# Patient Record
Sex: Female | Born: 1960 | Race: Black or African American | Hispanic: No | Marital: Married | Smoking: Never smoker
Health system: Southern US, Community
[De-identification: ages and names within clinical notes are randomized; demographics above are authoritative.]

## PROBLEM LIST (undated history)

## (undated) DIAGNOSIS — R51 Headache: Secondary | ICD-10-CM

## (undated) DIAGNOSIS — Z9109 Other allergy status, other than to drugs and biological substances: Secondary | ICD-10-CM

## (undated) DIAGNOSIS — M1990 Unspecified osteoarthritis, unspecified site: Secondary | ICD-10-CM

## (undated) DIAGNOSIS — E7800 Pure hypercholesterolemia, unspecified: Secondary | ICD-10-CM

## (undated) DIAGNOSIS — F419 Anxiety disorder, unspecified: Secondary | ICD-10-CM

## (undated) DIAGNOSIS — M549 Dorsalgia, unspecified: Secondary | ICD-10-CM

## (undated) DIAGNOSIS — R079 Chest pain, unspecified: Principal | ICD-10-CM

## (undated) DIAGNOSIS — I10 Essential (primary) hypertension: Secondary | ICD-10-CM

## (undated) DIAGNOSIS — K219 Gastro-esophageal reflux disease without esophagitis: Secondary | ICD-10-CM

## (undated) HISTORY — DX: Essential (primary) hypertension: I10

## (undated) HISTORY — PX: CARDIAC CATHETERIZATION: SHX172

## (undated) HISTORY — DX: Chest pain, unspecified: R07.9

## (undated) HISTORY — PX: UNILATERAL SALPINGECTOMY: SHX6160

## (undated) HISTORY — PX: DILATION AND CURETTAGE OF UTERUS: SHX78

---

## 2001-07-02 ENCOUNTER — Emergency Department (HOSPITAL_COMMUNITY): Admission: EM | Admit: 2001-07-02 | Discharge: 2001-07-02 | Payer: Self-pay | Admitting: Emergency Medicine

## 2001-11-30 ENCOUNTER — Emergency Department (HOSPITAL_COMMUNITY): Admission: EM | Admit: 2001-11-30 | Discharge: 2001-11-30 | Payer: Self-pay | Admitting: *Deleted

## 2004-10-06 ENCOUNTER — Emergency Department (HOSPITAL_COMMUNITY): Admission: EM | Admit: 2004-10-06 | Discharge: 2004-10-06 | Payer: Self-pay | Admitting: Emergency Medicine

## 2005-04-29 ENCOUNTER — Emergency Department (HOSPITAL_COMMUNITY): Admission: EM | Admit: 2005-04-29 | Discharge: 2005-04-29 | Payer: Self-pay | Admitting: Emergency Medicine

## 2005-12-23 ENCOUNTER — Emergency Department (HOSPITAL_COMMUNITY): Admission: EM | Admit: 2005-12-23 | Discharge: 2005-12-24 | Payer: Self-pay | Admitting: Emergency Medicine

## 2006-03-13 ENCOUNTER — Emergency Department (HOSPITAL_COMMUNITY): Admission: EM | Admit: 2006-03-13 | Discharge: 2006-03-13 | Payer: Self-pay | Admitting: Emergency Medicine

## 2006-09-10 ENCOUNTER — Emergency Department (HOSPITAL_COMMUNITY): Admission: EM | Admit: 2006-09-10 | Discharge: 2006-09-10 | Payer: Self-pay | Admitting: Emergency Medicine

## 2007-06-03 ENCOUNTER — Emergency Department (HOSPITAL_COMMUNITY): Admission: EM | Admit: 2007-06-03 | Discharge: 2007-06-03 | Payer: Self-pay | Admitting: Emergency Medicine

## 2007-12-24 ENCOUNTER — Emergency Department (HOSPITAL_COMMUNITY): Admission: EM | Admit: 2007-12-24 | Discharge: 2007-12-24 | Payer: Self-pay | Admitting: Emergency Medicine

## 2008-10-06 ENCOUNTER — Emergency Department (HOSPITAL_COMMUNITY): Admission: EM | Admit: 2008-10-06 | Discharge: 2008-10-06 | Payer: Self-pay | Admitting: Emergency Medicine

## 2010-06-14 ENCOUNTER — Emergency Department (HOSPITAL_COMMUNITY): Admission: EM | Admit: 2010-06-14 | Discharge: 2010-06-14 | Payer: Self-pay | Admitting: Emergency Medicine

## 2010-07-07 ENCOUNTER — Emergency Department (HOSPITAL_COMMUNITY): Admission: EM | Admit: 2010-07-07 | Discharge: 2010-07-07 | Payer: Self-pay | Admitting: Emergency Medicine

## 2010-08-27 ENCOUNTER — Emergency Department (HOSPITAL_COMMUNITY)
Admission: EM | Admit: 2010-08-27 | Discharge: 2010-08-27 | Payer: Self-pay | Source: Home / Self Care | Admitting: Emergency Medicine

## 2011-06-04 LAB — STREP A DNA PROBE: Group A Strep Probe: POSITIVE

## 2011-06-04 LAB — RAPID STREP SCREEN (MED CTR MEBANE ONLY): Streptococcus, Group A Screen (Direct): NEGATIVE

## 2012-01-18 ENCOUNTER — Encounter (HOSPITAL_COMMUNITY): Payer: Self-pay | Admitting: Emergency Medicine

## 2012-01-18 ENCOUNTER — Emergency Department (HOSPITAL_COMMUNITY)
Admission: EM | Admit: 2012-01-18 | Discharge: 2012-01-18 | Disposition: A | Discharge status: Home / Self Care | Payer: Self-pay | Attending: Emergency Medicine | Admitting: Emergency Medicine

## 2012-01-18 ENCOUNTER — Emergency Department (HOSPITAL_COMMUNITY): Payer: Self-pay

## 2012-01-18 DIAGNOSIS — N390 Urinary tract infection, site not specified: Secondary | ICD-10-CM | POA: Insufficient documentation

## 2012-01-18 DIAGNOSIS — D259 Leiomyoma of uterus, unspecified: Secondary | ICD-10-CM | POA: Insufficient documentation

## 2012-01-18 DIAGNOSIS — K8020 Calculus of gallbladder without cholecystitis without obstruction: Secondary | ICD-10-CM | POA: Insufficient documentation

## 2012-01-18 DIAGNOSIS — R109 Unspecified abdominal pain: Secondary | ICD-10-CM | POA: Insufficient documentation

## 2012-01-18 DIAGNOSIS — A599 Trichomoniasis, unspecified: Secondary | ICD-10-CM | POA: Insufficient documentation

## 2012-01-18 HISTORY — DX: Other allergy status, other than to drugs and biological substances: Z91.09

## 2012-01-18 LAB — URINALYSIS, ROUTINE W REFLEX MICROSCOPIC

## 2012-01-18 LAB — URINE MICROSCOPIC-ADD ON

## 2012-01-18 MED ORDER — IBUPROFEN 800 MG PO TABS: 800.0000 mg | ORAL_TABLET | Freq: Three times a day (TID) | ORAL | Status: AC

## 2012-01-18 MED ORDER — HYDROCODONE-ACETAMINOPHEN 5-325 MG PO TABS: 2.0000 | ORAL_TABLET | ORAL | Status: AC | PRN

## 2012-01-18 MED ORDER — CEPHALEXIN 500 MG PO CAPS: 500.0000 mg | ORAL_CAPSULE | Freq: Four times a day (QID) | ORAL | Status: AC

## 2012-01-18 MED ORDER — METRONIDAZOLE 500 MG PO TABS
2000.0000 mg | ORAL_TABLET | Freq: Once | ORAL | Status: AC
  Administered 2012-01-18: 2000 mg via ORAL
  Filled 2012-01-18: qty 4

## 2012-01-18 MED ORDER — IBUPROFEN 800 MG PO TABS
800.0000 mg | ORAL_TABLET | Freq: Once | ORAL | Status: AC
  Administered 2012-01-18: 800 mg via ORAL
  Filled 2012-01-18: qty 1

## 2012-01-18 MED ORDER — HYDROCODONE-ACETAMINOPHEN 5-325 MG PO TABS
2.0000 | ORAL_TABLET | Freq: Once | ORAL | Status: AC
  Administered 2012-01-18: 2 via ORAL
  Filled 2012-01-18: qty 2

## 2012-01-18 NOTE — ED Notes (Signed)
Family at bedside. Patient is comfortable at this time. 

## 2012-01-18 NOTE — Discharge Instructions (Signed)
Trichomoniasis Trichomoniasis is an infection, caused by the Trichomonas organism, that affects both women and men. In women, the outer female genitalia and the vagina are affected. In men, the penis is mainly affected, but the prostate and other reproductive organs can also be involved. Trichomoniasis is a sexually transmitted disease (STD) and is most often passed to another person through sexual contact. The majority of people who get trichomoniasis do so from a sexual encounter and are also at risk for other STDs. CAUSES   Sexual intercourse with an infected partner.   It can be present in swimming pools or hot tubs.  SYMPTOMS   Abnormal gray-green frothy vaginal discharge in women.   Vaginal itching and irritation in women.   Itching and irritation of the area outside the vagina in women.   Penile discharge with or without pain in males.   Inflammation of the urethra (urethritis), causing painful urination.   Bleeding after sexual intercourse.  RELATED COMPLICATIONS  Pelvic inflammatory disease.   Infection of the uterus (endometritis).   Infertility.   Tubal (ectopic) pregnancy.   It can be associated with other STDs, including gonorrhea and chlamydia, hepatitis B, and HIV.  COMPLICATIONS DURING PREGNANCY  Early (premature) delivery.   Premature rupture of the membranes (PROM).   Low birth weight.  DIAGNOSIS   Visualization of Trichomonas under the microscope from the vagina discharge.   Ph of the vagina greater than 4.5, tested with a test tape.   Trich Rapid Test.   Culture of the organism, but this is not usually needed.   It may be found on a Pap test.   Having a "strawberry cervix,"which means the cervix looks very red like a strawberry.  TREATMENT   You may be given medication to fight the infection. Inform your caregiver if you could be or are pregnant. Some medications used to treat the infection should not be taken during pregnancy.    Over-the-counter medications or creams to decrease itching or irritation may be recommended.   Your sexual partner will need to be treated if infected.  HOME CARE INSTRUCTIONS   Take all medication prescribed by your caregiver.   Take over-the-counter medication for itching or irritation as directed by your caregiver.   Do not have sexual intercourse while you have the infection.   Do not douche or wear tampons.   Discuss your infection with your partner, as your partner may have acquired the infection from you. Or, your partner may have been the person who transmitted the infection to you.   Have your sex partner examined and treated if necessary.   Practice safe, informed, and protected sex.   See your caregiver for other STD testing.  SEEK MEDICAL CARE IF:   You still have symptoms after you finish the medication.   You have an oral temperature above 102 F (38.9 C).   You develop belly (abdominal) pain.   You have pain when you urinate.   You have bleeding after sexual intercourse.   You develop a rash.   The medication makes you sick or makes you throw up (vomit).  Document Released: 02/19/2001 Document Revised: 08/15/2011 Document Reviewed: 03/17/2009 [facility] Patient Information 2012 [place], [place].Urinary Tract Infection Infections of the urinary tract can start in several places. A bladder infection (cystitis), a kidney infection (pyelonephritis), and a prostate infection (prostatitis) are different types of urinary tract infections (UTIs). They usually get better if treated with medicines (antibiotics) that kill germs. Take all the medicine until it  is gone. You or your child may feel better in a few days, but TAKE ALL MEDICINE or the infection may not respond and may become more difficult to treat. HOME CARE INSTRUCTIONS   Drink enough water and fluids to keep the urine clear or pale yellow. Cranberry juice is especially recommended, in addition to large  amounts of water.   Avoid caffeine, tea, and carbonated beverages. They tend to irritate the bladder.   Alcohol may irritate the prostate.   Only take over-the-counter or prescription medicines for pain, discomfort, or fever as directed by your caregiver.  To prevent further infections:  Empty the bladder often. Avoid holding urine for long periods of time.   After a bowel movement, women should cleanse from front to back. Use each tissue only once.   Empty the bladder before and after sexual intercourse.  FINDING OUT THE RESULTS OF YOUR TEST Not all test results are available during your visit. If your or your child's test results are not back during the visit, make an appointment with your caregiver to find out the results. Do not assume everything is normal if you have not heard from your caregiver or the medical facility. It is important for you to follow up on all test results. SEEK MEDICAL CARE IF:   There is back pain.   Your baby is older than 3 months with a rectal temperature of 100.5 F (38.1 C) or higher for more than 1 day.   Your or your child's problems (symptoms) are no better in 3 days. Return sooner if you or your child is getting worse.  SEEK IMMEDIATE MEDICAL CARE IF:   There is severe back pain or lower abdominal pain.   You or your child develops chills.   You have a fever.   Your baby is older than 3 months with a rectal temperature of 102 F (38.9 C) or higher.   Your baby is 34 months old or younger with a rectal temperature of 100.4 F (38 C) or higher.   There is nausea or vomiting.   There is continued burning or discomfort with urination.  MAKE SURE YOU:   Understand these instructions.   Will watch your condition.   Will get help right away if you are not doing well or get worse.  Document Released: 06/05/2005 Document Revised: 08/15/2011 Document Reviewed: 01/08/2007 [facility] Patient Information 2012 [place], [place].

## 2012-01-18 NOTE — ED Notes (Signed)
Family at bedside. Patient states she does not need anything at this time. 

## 2012-01-18 NOTE — ED Notes (Signed)
Pt c/o pain to L lower back and goes half way around. Does not go into front abd area. Denies gi/gu sx's. lnbm this am and denies black or bloody stools. Pain worse with crossing legs and walking.

## 2012-01-18 NOTE — ED Provider Notes (Signed)
History   This chart was scribed for Glynn Octave, MD by Sofie Rower. The patient was seen in room APA07/APA07 and the patient's care was started at 9:09 AM     CSN: [id number]  Arrival date & time 01/18/12  0856   None     Chief Complaint  Patient presents with  . Flank Pain    (Consider location/radiation/quality/duration/timing/severity/associated sxs/prior treatment) HPI  Kendra Valenzuela is a 51 y.o. female who presents to the Emergency Department complaining of moderate, constant flank pain located at the left lower back onset one week ago with associated symptoms of radiating leg pain. The pt states the flank pain "comes in the early morning." The pt informs the EDP "that there was no injry associated with the flank pain." Modifying factors include walking and certain positions which intensify the pain, lying down which moderately relieves the pain, taking ibuprofen which moderately relieves the pain. Pt has a hx of partial hysterectomy.  Pt denies abdominal pain, fever, vomiting, dysuria, hematuria, losing bladder or bowel control.        History  Substance Use Topics  . Smoking status: Former Games developer  . Smokeless tobacco: Not on file  . Alcohol Use: No    OB History    Grav Para Term Preterm Abortions TAB SAB Ect Mult Living                  Review of Systems  All other systems reviewed and are negative.    10 Systems reviewed and all are negative for acute change except as noted in the HPI.    Allergies  Review of patient's allergies indicates no known allergies.  Home Medications  No current outpatient prescriptions on file.  BP 135/88  Pulse 91  Temp 98.7 F (37.1 C)  Resp 18  Ht 5\' 6"  (1.676 m)  Wt 176 lb (79.833 kg)  BMI 28.41 kg/m2  Physical Exam  Nursing note and vitals reviewed. Constitutional: She is oriented to person, place, and time. She appears well-developed and well-nourished.  HENT:  Right Ear: External ear normal.    Left Ear: External ear normal.  Nose: Nose normal.  Eyes: Conjunctivae and EOM are normal. Right eye exhibits no discharge. Left eye exhibits no discharge.  Neck: Normal range of motion. Neck supple.  Cardiovascular: Normal rate.   Pulmonary/Chest: Effort normal.  Abdominal: Soft. There is no tenderness.  Musculoskeletal: Normal range of motion. She exhibits tenderness (Mild left CVA.).       Mild left CVA tenderness. 5/5 strength in lower extremities. Intact ankle plantarflexion ,dorsiflexion, great toe extension intact bilaterally, +2 DP pulses, unable to illicit patellar reflexes.   Neurological: She is alert and oriented to person, place, and time.  Skin: Skin is warm and dry.  Psychiatric: She has a normal mood and affect. Her behavior is normal.    ED Course  Procedures (including critical care time)  9:13AM- EDP at bedside discusses treatment plan.   10:05AM- Recheck. EDP at bedside discusses laboratory results. Pt denies any abdominal pain or discharge.   12:07PM- Recheck. EDP at bedside discusses continued treatment plan.     COORDINATION OF CARE:     Labs Reviewed  URINALYSIS, ROUTINE W REFLEX MICROSCOPIC - Abnormal; Notable for the following:    APPearance HAZY (*)    Leukocytes, UA MODERATE (*)    All other components within normal limits  URINE MICROSCOPIC-ADD ON - Abnormal; Notable for the following:    Squamous Epithelial / LPF  FEW (*)    Bacteria, UA FEW (*)    All other components within normal limits   Ct Abdomen Pelvis Wo Contrast  01/18/2012  *RADIOLOGY REPORT*  Clinical Data: Flank pain  CT ABDOMEN AND PELVIS WITHOUT CONTRAST  Technique:  Multidetector CT imaging of the abdomen and pelvis was performed following the standard protocol without intravenous contrast.  Comparison: None.  Findings: Lung bases are clear.  No pericardial or pleural effusion.  There is no focal liver abnormality.  Multiple stones are noted along the dependent portion of the  gallbladder.  No evidence for gallbladder wall thickening or pericholecystic inflammatory change. No biliary dilatation.  The pancreas appears normal.  The spleen appears within normal limits.  Both adrenal glands are normal.  Duplicated right renal collecting system.  No right hydronephrosis.  No ureteral row calculi identified.  The left kidney appears within normal limits.  No left- sided hydronephrosis or hydroureter.  The urinary bladder appears normal.  The patient has a partially calcified fibroid uterus.  There is no enlarged upper abdominal lymph nodes.  No pelvic or inguinal adenopathy.  The stomach appears normal.  The small bowel loops are within normal limits.  The appendix is identified and appears normal.  The colon is unremarkable.  There are degenerative changes noted at the L5-S1 facet joints.  IMPRESSION:  1.  No evidence for renal calculi or obstructive uropathy. 2.  Enlarged and partially calcified fibroid uterus. 3.  Gallstones.  No secondary signs of acute cholecystitis.  Original Report Authenticated By: Rosealee Albee, M.D.      No diagnosis found.    MDM  Left flank pain without radiation. No urinary or vaginal symptoms. No weakness, numbness, tingling in the legs. No bowel and bladder incontinence, fever or vomiting.   Urinalysis with possible infection. Patient informed of trichomonas finding we'll treat with Flagyl.  Denies vaginal bleeding or discharge.  Declines pelvic exam. No suggestion of kidney stone. No suggestion of serious spinal cord pathology. No red flags for back pain.  CT negative for stone.  Will treat possible UTI. Culture sent.  Return precautions discussed.  I personally performed the services described in this documentation, which was scribed in my presence.  The recorded information has been reviewed and considered.   Glynn Octave, MD 01/18/12 [id number]

## 2012-07-19 ENCOUNTER — Emergency Department (HOSPITAL_COMMUNITY): Payer: Self-pay

## 2012-07-19 ENCOUNTER — Encounter (HOSPITAL_COMMUNITY): Payer: Self-pay | Admitting: Emergency Medicine

## 2012-07-19 ENCOUNTER — Emergency Department (HOSPITAL_COMMUNITY)
Admission: EM | Admit: 2012-07-19 | Discharge: 2012-07-19 | Disposition: A | Discharge status: Home / Self Care | Payer: Self-pay | Attending: Emergency Medicine | Admitting: Emergency Medicine

## 2012-07-19 DIAGNOSIS — Z9109 Other allergy status, other than to drugs and biological substances: Secondary | ICD-10-CM | POA: Insufficient documentation

## 2012-07-19 DIAGNOSIS — Z87891 Personal history of nicotine dependence: Secondary | ICD-10-CM | POA: Insufficient documentation

## 2012-07-19 DIAGNOSIS — M5431 Sciatica, right side: Secondary | ICD-10-CM

## 2012-07-19 DIAGNOSIS — M5430 Sciatica, unspecified side: Secondary | ICD-10-CM | POA: Insufficient documentation

## 2012-07-19 MED ORDER — CYCLOBENZAPRINE HCL 5 MG PO TABS: 5.0000 mg | ORAL_TABLET | Freq: Three times a day (TID) | ORAL | Status: DC | PRN

## 2012-07-19 MED ORDER — HYDROCODONE-ACETAMINOPHEN 5-325 MG PO TABS: 1.0000 | ORAL_TABLET | ORAL | Status: AC | PRN

## 2012-07-19 MED ORDER — IBUPROFEN 600 MG PO TABS: 600.0000 mg | ORAL_TABLET | Freq: Three times a day (TID) | ORAL | Status: DC

## 2012-07-19 MED ORDER — HYDROCODONE-ACETAMINOPHEN 5-325 MG PO TABS
1.0000 | ORAL_TABLET | Freq: Once | ORAL | Status: AC
  Administered 2012-07-19: 1 via ORAL
  Filled 2012-07-19: qty 1

## 2012-07-19 NOTE — ED Notes (Signed)
Pt reports onset of back pain Yesterday and R knee pain this am.

## 2012-07-21 NOTE — ED Provider Notes (Signed)
History     CSN: [id number]  Arrival date & time 07/19/12  1039   First MD Initiated Contact with Patient 07/19/12 1120      Chief Complaint  Patient presents with  . Back Pain    (Consider location/radiation/quality/duration/timing/severity/associated sxs/prior treatment) HPI Comments: Kendra Valenzuela presents with right lower back pain which started gradually yesterday and woke today with radiation of pain into her right lateral knee.  Pain is constant in her lower back,  But worse with movement and with attempts to stand straight.  She denies any obvious injury to her back, does not recall any specific lifting incidents or falls.  She denies numbness or weakness in her legs and has had no urinary or bowel retention or incontinence.  She has taken ibuprofen with mild relief.  She does not have a history of cancer and denies IVDU.  The history is provided by the patient.    Past Medical History  Diagnosis Date  . Environmental allergies     Past Surgical History  Procedure Date  . Partial hysterectomy     Family History  Problem Relation Age of Onset  . Family history unknown: Yes    History  Substance Use Topics  . Smoking status: Former Games developer  . Smokeless tobacco: Never Used  . Alcohol Use: No    OB History    Grav Para Term Preterm Abortions TAB SAB Ect Mult Living                  Review of Systems  Constitutional: Negative for fever.  Respiratory: Negative for shortness of breath.   Cardiovascular: Negative for chest pain and leg swelling.  Gastrointestinal: Negative for abdominal pain, constipation and abdominal distention.  Genitourinary: Negative for dysuria, urgency, frequency, flank pain and difficulty urinating.  Musculoskeletal: Positive for back pain. Negative for joint swelling and gait problem.  Skin: Negative for rash.  Neurological: Negative for weakness and numbness.    Allergies  Review of patient's allergies indicates no known  allergies.  Home Medications   Current Outpatient Rx  Name  Route  Sig  Dispense  Refill  . CYCLOBENZAPRINE HCL 5 MG PO TABS   Oral   Take 1 tablet (5 mg total) by mouth 3 (three) times daily as needed for muscle spasms.   15 tablet   0   . DIPHENHYDRAMINE HCL 25 MG PO TABS   Oral   Take 25 mg by mouth every 6 (six) hours as needed. For allergy         . HYDROCODONE-ACETAMINOPHEN 5-325 MG PO TABS   Oral   Take 1 tablet by mouth every 4 (four) hours as needed for pain.   20 tablet   0   . IBUPROFEN 200 MG PO TABS   Oral   Take 400 mg by mouth every 6 (six) hours as needed. For pain         . IBUPROFEN 600 MG PO TABS   Oral   Take 1 tablet (600 mg total) by mouth 3 (three) times daily.   15 tablet   0     BP 152/87  Pulse 98  Temp 98.5 F (36.9 C) (Oral)  Resp 14  Ht 5\' 6"  (1.676 m)  Wt 160 lb (72.576 kg)  BMI 25.82 kg/m2  SpO2 98%  Physical Exam  Nursing note and vitals reviewed. Constitutional: She appears well-developed and well-nourished.  HENT:  Head: Normocephalic.  Eyes: Conjunctivae normal are normal.  Neck:  Normal range of motion. Neck supple.  Cardiovascular: Normal rate and intact distal pulses.        Pedal pulses normal.  Pulmonary/Chest: Effort normal.  Abdominal: Soft. Bowel sounds are normal. She exhibits no distension and no mass.  Musculoskeletal: Normal range of motion. She exhibits no edema.       Lumbar back: She exhibits tenderness. She exhibits no swelling, no edema and no spasm.       Patient has right paralumbar ttp.  No midline tenderness.  Neurological: She is alert. She has normal strength. She displays no atrophy and no tremor. No sensory deficit. Gait normal.  Reflex Scores:      Patellar reflexes are 2+ on the right side and 2+ on the left side.      Achilles reflexes are 2+ on the right side and 2+ on the left side.      No strength deficit noted in hip and knee flexor and extensor muscle groups.  Ankle flexion and  extension intact.  Skin: Skin is warm and dry.  Psychiatric: She has a normal mood and affect.    ED Course  Procedures (including critical care time)  Labs Reviewed - No data to display No results found.   1. Sciatica of right side       MDM  Pt prescribed hydrocodone, flexeril and encouraged to continue with ibuprofen,  Prescribing prescription 600 mg tab strength.  Heating pad,  Avoid lifting.  Recheck if not improved in next 5 days,  referrals given.  No neuro deficit on exam or by history to suggest emergent or surgical presentation.  Also discussed worsened sx that should prompt immediate re-evaluation including distal weakness, bowel/bladder retention/incontinence.              Burgess Amor, [place] 07/21/12 1859

## 2012-07-22 NOTE — ED Provider Notes (Signed)
Medical screening examination/treatment/procedure(s) were performed by non-physician practitioner and as supervising physician I was immediately available for consultation/collaboration.   Charene Mccallister M Eleanora Guinyard, DO 07/22/12 1358 

## 2013-01-07 DIAGNOSIS — R079 Chest pain, unspecified: Secondary | ICD-10-CM | POA: Insufficient documentation

## 2013-01-07 DIAGNOSIS — R0789 Other chest pain: Secondary | ICD-10-CM | POA: Insufficient documentation

## 2013-01-07 HISTORY — DX: Chest pain, unspecified: R07.9

## 2013-01-13 ENCOUNTER — Other Ambulatory Visit: Payer: Self-pay

## 2013-01-13 ENCOUNTER — Encounter (HOSPITAL_COMMUNITY): Payer: Self-pay

## 2013-01-13 ENCOUNTER — Emergency Department (HOSPITAL_COMMUNITY): Payer: Self-pay

## 2013-01-13 ENCOUNTER — Observation Stay (HOSPITAL_COMMUNITY)
Admission: EM | Admit: 2013-01-13 | Discharge: 2013-01-14 | Disposition: A | Discharge status: Home / Self Care | Payer: MEDICAID | Attending: Internal Medicine | Admitting: Internal Medicine

## 2013-01-13 DIAGNOSIS — I10 Essential (primary) hypertension: Secondary | ICD-10-CM

## 2013-01-13 DIAGNOSIS — R079 Chest pain, unspecified: Principal | ICD-10-CM

## 2013-01-13 DIAGNOSIS — M549 Dorsalgia, unspecified: Secondary | ICD-10-CM

## 2013-01-13 DIAGNOSIS — G8929 Other chronic pain: Secondary | ICD-10-CM | POA: Diagnosis present

## 2013-01-13 DIAGNOSIS — I517 Cardiomegaly: Secondary | ICD-10-CM

## 2013-01-13 HISTORY — DX: Dorsalgia, unspecified: M54.9

## 2013-01-13 HISTORY — DX: Essential (primary) hypertension: I10

## 2013-01-13 LAB — URINALYSIS, ROUTINE W REFLEX MICROSCOPIC
Bilirubin Urine: NEGATIVE
Ketones, ur: NEGATIVE mg/dL
Leukocytes, UA: NEGATIVE
Nitrite: NEGATIVE
Protein, ur: NEGATIVE mg/dL
Urobilinogen, UA: 0.2 mg/dL (ref 0.0–1.0)

## 2013-01-13 LAB — CBC WITH DIFFERENTIAL/PLATELET
Basophils Absolute: 0 10*3/uL (ref 0.0–0.1)
Basophils Relative: 0 % (ref 0–1)
Eosinophils Relative: 3 % (ref 0–5)
Hemoglobin: 12.3 g/dL (ref 12.0–15.0)
Lymphs Abs: 3.2 10*3/uL (ref 0.7–4.0)
Monocytes Absolute: 0.5 10*3/uL (ref 0.1–1.0)
RBC: 4.41 MIL/uL (ref 3.87–5.11)
RDW: 14.6 % (ref 11.5–15.5)

## 2013-01-13 LAB — LIPID PANEL
HDL: 65 mg/dL (ref >39)
LDL Cholesterol: 104 mg/dL — ABNORMAL HIGH (ref 0–99)
Total CHOL/HDL Ratio: 3.3 RATIO

## 2013-01-13 LAB — TROPONIN I
Troponin I: <0.3 ng/mL (ref <0.30)
Troponin I: <0.3 ng/mL (ref <0.30)

## 2013-01-13 LAB — RAPID URINE DRUG SCREEN, HOSP PERFORMED
Cocaine: NOT DETECTED
Opiates: POSITIVE — AB

## 2013-01-13 LAB — BASIC METABOLIC PANEL
BUN: 20 mg/dL (ref 6–23)
CO2: 25 mEq/L (ref 19–32)
Calcium: 9.3 mg/dL (ref 8.4–10.5)
Creatinine, Ser: 0.64 mg/dL (ref 0.50–1.10)
Glucose, Bld: 110 mg/dL — ABNORMAL HIGH (ref 70–99)

## 2013-01-13 LAB — PRO B NATRIURETIC PEPTIDE: Pro B Natriuretic peptide (BNP): 44.6 pg/mL (ref 0–125)

## 2013-01-13 LAB — HEMOGLOBIN A1C: Mean Plasma Glucose: 117 mg/dL — ABNORMAL HIGH (ref <117)

## 2013-01-13 LAB — D-DIMER, QUANTITATIVE: D-Dimer, Quant: <0.27 ug/mL-FEU (ref 0.00–0.48)

## 2013-01-13 MED ORDER — NITROGLYCERIN 0.4 MG SL SUBL
0.4000 mg | SUBLINGUAL_TABLET | SUBLINGUAL | Status: DC | PRN
  Administered 2013-01-13: 0.4 mg via SUBLINGUAL
  Filled 2013-01-13: qty 25

## 2013-01-13 MED ORDER — ACETAMINOPHEN 650 MG RE SUPP: 650.0000 mg | Freq: Four times a day (QID) | RECTAL | Status: DC | PRN

## 2013-01-13 MED ORDER — ONDANSETRON HCL 4 MG PO TABS: 4.0000 mg | ORAL_TABLET | Freq: Four times a day (QID) | ORAL | Status: DC | PRN

## 2013-01-13 MED ORDER — MORPHINE SULFATE 2 MG/ML IJ SOLN: 1.0000 mg | INTRAMUSCULAR | Status: DC | PRN

## 2013-01-13 MED ORDER — ONDANSETRON HCL 4 MG/2ML IJ SOLN: 4.0000 mg | Freq: Four times a day (QID) | INTRAMUSCULAR | Status: DC | PRN

## 2013-01-13 MED ORDER — MORPHINE SULFATE 2 MG/ML IJ SOLN
2.0000 mg | Freq: Once | INTRAMUSCULAR | Status: AC
  Administered 2013-01-13: 2 mg via INTRAVENOUS
  Filled 2013-01-13: qty 1

## 2013-01-13 MED ORDER — ALUM & MAG HYDROXIDE-SIMETH 200-200-20 MG/5ML PO SUSP: 30.0000 mL | Freq: Four times a day (QID) | ORAL | Status: DC | PRN

## 2013-01-13 MED ORDER — ENOXAPARIN SODIUM 40 MG/0.4ML ~~LOC~~ SOLN
40.0000 mg | Freq: Every day | SUBCUTANEOUS | Status: DC
  Administered 2013-01-13 – 2013-01-14 (×2): 40 mg via SUBCUTANEOUS
  Filled 2013-01-13 (×2): qty 0.4

## 2013-01-13 MED ORDER — NITROGLYCERIN 0.4 MG SL SUBL: 0.4000 mg | SUBLINGUAL_TABLET | SUBLINGUAL | Status: DC | PRN

## 2013-01-13 MED ORDER — HYDROCODONE-ACETAMINOPHEN 5-325 MG PO TABS
1.0000 | ORAL_TABLET | ORAL | Status: DC | PRN
  Administered 2013-01-13: 2 via ORAL
  Filled 2013-01-13: qty 2

## 2013-01-13 MED ORDER — ZOLPIDEM TARTRATE 5 MG PO TABS: 5.0000 mg | ORAL_TABLET | Freq: Every evening | ORAL | Status: DC | PRN

## 2013-01-13 MED ORDER — GI COCKTAIL ~~LOC~~
30.0000 mL | Freq: Three times a day (TID) | ORAL | Status: DC | PRN
  Filled 2013-01-13: qty 30

## 2013-01-13 MED ORDER — SODIUM CHLORIDE 0.9 % IV BOLUS (SEPSIS)
250.0000 mL | Freq: Once | INTRAVENOUS | Status: AC
  Administered 2013-01-13: 250 mL via INTRAVENOUS

## 2013-01-13 MED ORDER — SODIUM CHLORIDE 0.9 % IJ SOLN
3.0000 mL | Freq: Two times a day (BID) | INTRAMUSCULAR | Status: DC
  Administered 2013-01-13 – 2013-01-14 (×3): 3 mL via INTRAVENOUS

## 2013-01-13 MED ORDER — ACETAMINOPHEN 325 MG PO TABS: 650.0000 mg | ORAL_TABLET | Freq: Four times a day (QID) | ORAL | Status: DC | PRN

## 2013-01-13 MED ORDER — SODIUM CHLORIDE 0.9 % IV SOLN
INTRAVENOUS | Status: DC
  Administered 2013-01-13: 08:00:00 via INTRAVENOUS

## 2013-01-13 MED ORDER — ASPIRIN EC 325 MG PO TBEC
325.0000 mg | DELAYED_RELEASE_TABLET | Freq: Every day | ORAL | Status: DC
  Administered 2013-01-14: 325 mg via ORAL
  Filled 2013-01-13: qty 1

## 2013-01-13 MED ORDER — PANTOPRAZOLE SODIUM 40 MG PO TBEC
40.0000 mg | DELAYED_RELEASE_TABLET | Freq: Two times a day (BID) | ORAL | Status: DC
  Administered 2013-01-13 – 2013-01-14 (×3): 40 mg via ORAL
  Filled 2013-01-13 (×3): qty 1

## 2013-01-13 NOTE — ED Notes (Signed)
Nurse unable to take report at this time.

## 2013-01-13 NOTE — ED Provider Notes (Addendum)
History    This chart was scribed for Shelda Jakes, MD by Leone Payor, ED Scribe. This patient was seen in room APA18/APA18 and the patient's care was started 7:39 AM.   CSN: [id number]  Arrival date & time 01/13/13  0735   None     Chief Complaint  Patient presents with  . Chest Pain     The history is provided by the patient. No language interpreter was used.    HPI Comments: Kendra Valenzuela is a 52 y.o. female who presents to the Emergency Department complaining of new, constant, non-radiating chest pain to the substernal chest starting about 5 hours ago. Pt describes the pain as pushing and rates it at an 8/10 currently (10/10 at worst). She states having this pain a few times recently and it would last about 1 hour at a time. She did not visit the ED at those times and would just take an aspirin for the pain at home. The last time she had similar pains was about 1 month ago. She has associated nausea and diarrhea. Pt took 1 bayer aspirin at home, prior to arrival. She denies SOB. Pt is a former smoker but denies alcohol use.  No PCP.  Past Medical History  Diagnosis Date  . Environmental allergies   . Back pain     Past Surgical History  Procedure Laterality Date  . Partial hysterectomy    . Dilation and curettage of uterus      No family history on file.  History  Substance Use Topics  . Smoking status: Former Games developer  . Smokeless tobacco: Never Used  . Alcohol Use: No    OB History   Grav Para Term Preterm Abortions TAB SAB Ect Mult Living                  Review of Systems  Constitutional: Positive for diaphoresis. Negative for fever.  HENT: Negative for congestion, sore throat and rhinorrhea.   Eyes: Negative for visual disturbance.  Respiratory: Negative for cough and shortness of breath.   Cardiovascular: Positive for chest pain. Negative for leg swelling.  Gastrointestinal: Positive for nausea and diarrhea (4-5 episode yesterday). Negative  for vomiting and abdominal pain.  Genitourinary: Negative for dysuria, hematuria and vaginal discharge.  Musculoskeletal: Positive for back pain. Negative for joint swelling.  Skin: Negative for rash.  Neurological: Positive for headaches.  Hematological: Does not bruise/bleed easily.  Psychiatric/Behavioral: Negative for confusion.    Allergies  Review of patient's allergies indicates no known allergies.  Home Medications   Current Outpatient Rx  Name  Route  Sig  Dispense  Refill  . diphenhydrAMINE (BENADRYL) 25 MG tablet   Oral   Take 25 mg by mouth every 6 (six) hours as needed. For allergy         . ibuprofen (ADVIL,MOTRIN) 200 MG tablet   Oral   Take 400 mg by mouth every 6 (six) hours as needed. For pain           BP 143/89  Pulse 78  Temp(Src) 98.7 F (37.1 C) (Oral)  Resp 18  Ht 5\' 6"  (1.676 m)  Wt 160 lb (72.576 kg)  BMI 25.84 kg/m2  SpO2 100%  Physical Exam  Nursing note and vitals reviewed. Constitutional: She is oriented to person, place, and time. She appears well-developed and well-nourished. No distress.  HENT:  Head: Normocephalic and atraumatic.  Mouth/Throat: Oropharynx is clear and moist.  Eyes: EOM are normal. No scleral  icterus.  Neck: Neck supple. No tracheal deviation present.  Cardiovascular: Normal rate, regular rhythm and normal heart sounds.   Pulmonary/Chest: Effort normal and breath sounds normal. No respiratory distress.  Abdominal: Soft. Bowel sounds are normal. There is no tenderness.  Musculoskeletal: Normal range of motion. She exhibits no edema.  No swelling in ankles.   Neurological: She is alert and oriented to person, place, and time.  Skin: Skin is warm and dry.  Psychiatric: She has a normal mood and affect. Her behavior is normal.    ED Course  Procedures (including critical care time)  DIAGNOSTIC STUDIES: Oxygen Saturation is 98% RA  COORDINATION OF CARE: 7:53 AM Discussed treatment plan with pt at bedside and  pt agreed to plan.   Labs Reviewed  BASIC METABOLIC PANEL - Abnormal; Notable for the following:    Glucose, Bld 110 (*)    All other components within normal limits  CBC WITH DIFFERENTIAL  TROPONIN I  D-DIMER, QUANTITATIVE  PRO B NATRIURETIC PEPTIDE  URINALYSIS, ROUTINE W REFLEX MICROSCOPIC   Dg Chest Portable 1 View  01/13/2013  *RADIOLOGY REPORT*  Clinical Data: Chest pain  PORTABLE CHEST - 1 VIEW  Comparison: None.  Findings: Borderline enlarged cardiac silhouette and mediastinal contours, possibly accentuated due to AP projection and rotation. Mild tortuosity of the thoracic aorta.  No focal airspace opacity. No definite pleural effusion or pneumothorax.  No definite evidence of edema.  No definite acute osseous abnormality.  IMPRESSION:  1.  No acute cardiopulmonary disease. 2.  Borderline enlarged cardiac silhouette and mediastinal contours. No definite evidence of edema.   Original Report Authenticated By: Tacey Ruiz, MD    Results for orders placed during the hospital encounter of 01/13/13  CBC WITH DIFFERENTIAL      Result Value Range   WBC 7.7  4.0 - 10.5 K/uL   RBC 4.41  3.87 - 5.11 MIL/uL   Hemoglobin 12.3  12.0 - 15.0 g/dL   HCT 16.1  09.6 - 04.5 %   MCV 84.8  78.0 - 100.0 fL   MCH 27.9  26.0 - 34.0 pg   MCHC 32.9  30.0 - 36.0 g/dL   RDW 40.9  81.1 - 91.4 %   Platelets 347  150 - 400 K/uL   Neutrophils Relative 48  43 - 77 %   Neutro Abs 3.7  1.7 - 7.7 K/uL   Lymphocytes Relative 42  12 - 46 %   Lymphs Abs 3.2  0.7 - 4.0 K/uL   Monocytes Relative 7  3 - 12 %   Monocytes Absolute 0.5  0.1 - 1.0 K/uL   Eosinophils Relative 3  0 - 5 %   Eosinophils Absolute 0.2  0.0 - 0.7 K/uL   Basophils Relative 0  0 - 1 %   Basophils Absolute 0.0  0.0 - 0.1 K/uL  BASIC METABOLIC PANEL      Result Value Range   Sodium 141  135 - 145 mEq/L   Potassium 3.5  3.5 - 5.1 mEq/L   Chloride 103  96 - 112 mEq/L   CO2 25  19 - 32 mEq/L   Glucose, Bld 110 (*) 70 - 99 mg/dL   BUN 20  6 -  23 mg/dL   Creatinine, Ser 7.82  0.50 - 1.10 mg/dL   Calcium 9.3  8.4 - 95.6 mg/dL   GFR calc non Af Amer >90  >90 mL/min   GFR calc Af Amer >90  >90 mL/min  TROPONIN  I      Result Value Range   Troponin I <0.30  <0.30 ng/mL  D-DIMER, QUANTITATIVE      Result Value Range   D-Dimer, Quant <0.27  0.00 - 0.48 ug/mL-FEU  PRO B NATRIURETIC PEPTIDE      Result Value Range   Pro B Natriuretic peptide (BNP) 44.6  0 - 125 pg/mL      Date: 01/13/2013  Rate: 99  Rhythm: normal sinus rhythm  QRS Axis: normal  Intervals: normal  ST/T Wave abnormalities: normal  Conduction Disutrbances:none  Narrative Interpretation:   Old EKG Reviewed: none available    1. Chest pain       MDM  Patient with onset of chest pain at the 3:00 in the morning. Has had similar episodes in the past lasting for about an hour last time was about a month ago. Patient has not had a workup. Today's workup negative for any evidence of acute cardiac event at this point in time. EKG is normal first troponin is negative however cannot explain the cause of the chest pain may be cardiac in nature patient will need admission for monitoring and serial cardiac enzymes.  Chest x-rays negative for pneumonia pulmonary edema or pneumothorax d-dimer was negative since not consistent with a pulmonary embolus. We'll discuss with the [facility] team for admission.  With sublingual nitroglycerin and morphine chest pain only came down to a 5/10 the patient appears much more comfortable.    I personally performed the services described in this documentation, which was scribed in my presence. The recorded information has been reviewed and is accurate.      Shelda Jakes, MD 01/13/13 1610  Shelda Jakes, MD 01/13/13 [phone]

## 2013-01-13 NOTE — ED Notes (Signed)
Pt still c/o cp being a 6 out of 10. New med orders received.

## 2013-01-13 NOTE — ED Notes (Signed)
Pt reports woke up at 3 am with pain in r and center of chest.  Describes pain as dull.  Denies SOB.  Reports feels nauseated.  Pt says pain doesn't get any better or worse with movement.  Denies injury  Or cough.  States pain does not radiate anywhere else.   Pt took 1 bayer asa this am.

## 2013-01-13 NOTE — H&P (Signed)
[facility] History and Physical  Kendra Valenzuela WUJ:[id number] DOB: 06-17-1961 DOA: 01/13/2013  Referring physician:  PCP: No PCP Per Patient  Specialists:   Chief Complaint: chest pain  HPI: Kendra Valenzuela is a 52 y.o. female with past medical hx including chronic low back pain and allergies presents to ed cc chest pain. Information obtained from pt. States she was awakened at 3am with chest pain. States pain located substernal area, was constant and sharp. Associated symptoms include diaphoresis and nausea without vomiting. She took aspirin and went back to sleep. She denies palpitations, sob, dizziness, orthopnea, LE swelling. She states the pain not associated with movement. She awakened at 7am and still had pain. She rated the pain 8/10 even after aspirin. She came to ED was given nitro and pain at time of exam 6/10.  She reports having similar episode last month during her aunts funeral. In ED initial troponin neg, EKG NSR, chest xray with no acute cardiopulmonary disease and borderline enlarged cardiac silhouette and mediastinal contours. [facility] asked to admit.   Review of Systems: The patient denies anorexia, fever, weight loss, vision loss, decreased hearing, hoarseness,  syncope, dyspnea on exertion, peripheral edema, balance deficits, hemoptysis, abdominal pain, melena, hematochezia, severe indigestion/heartburn, hematuria, incontinence, genital sores, muscle weakness, suspicious skin lesions, transient blindness, difficulty walking, depression, unusual weight change, abnormal bleeding, enlarged lymph nodes, angioedema, and breast masses.    Past Medical History  Diagnosis Date  . Environmental allergies   . Back pain    Past Surgical History  Procedure Laterality Date  . Partial hysterectomy    . Dilation and curettage of uterus     Social History:  reports that she has quit smoking. She has never used smokeless tobacco. She reports that she does not drink alcohol or  use illicit drugs. 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 Lives with significant other. Independent with ADL's  No Known Allergies  No family history on file. Mother deceased in her 16's health hx unknown. Father alive with HTN. 7 siblings with no hx MI, DM, CAD, HTN  Prior to Admission medications   Medication Sig Start Date End Date Taking? Authorizing Provider  diphenhydrAMINE (BENADRYL) 25 MG tablet Take 25 mg by mouth every 6 (six) hours as needed. For allergy   Yes Historical Provider, MD  ibuprofen (ADVIL,MOTRIN) 200 MG tablet Take 400 mg by mouth every 6 (six) hours as needed. For pain   Yes Historical Provider, MD   Physical Exam: Filed Vitals:   01/13/13 0745 01/13/13 0822 01/13/13 0957 01/13/13 1000  BP: 174/102 143/89 141/111 159/86  Pulse:  78 71 73  Temp:      TempSrc:      Resp:  18 20 22   Height:      Weight:      SpO2:  100% 100%      General:  Well nourished NAD  Eyes: PERRL EOMI No scleral icterus  ENT: ears clear nose without drainage, mucus membranes mouth moist/pink  Neck: supple no JVD no lymphadenopathy  Cardiovascular: RRR No MGR No LEE PPP  Respiratory: normal effort BS clear bilaterally no wheeze  Abdomen: soft +BS non-tender to palpation  Skin: no rash/lesion warm /dry  Musculoskeletal: no clubbing no cyanosis Moves all extremities  Psychiatric: calm cooperative   Neurologic: cranial nerve II-XI intact speech clear facial symmetry  Labs on Admission:  Basic Metabolic Panel:  Recent Labs Lab 01/13/13 0745  NA 141  K 3.5  CL 103  CO2  25  GLUCOSE 110*  BUN 20  CREATININE 0.64  CALCIUM 9.3   Liver Function Tests: No results found for this basename: AST, ALT, ALKPHOS, BILITOT, PROT, ALBUMIN,  in the last 168 hours No results found for this basename: LIPASE, AMYLASE,  in the last 168 hours No results found for this basename: AMMONIA,  in the last 168 hours CBC:  Recent Labs Lab 01/13/13 0745  WBC 7.7   NEUTROABS 3.7  HGB 12.3  HCT 37.4  MCV 84.8  PLT 347   Cardiac Enzymes:  Recent Labs Lab 01/13/13 0745  TROPONINI <0.30    BNP (last 3 results)  Recent Labs  01/13/13 0745  PROBNP 44.6   CBG: No results found for this basename: GLUCAP,  in the last 168 hours  Radiological Exams on Admission: Dg Chest Portable 1 View  01/13/2013  *RADIOLOGY REPORT*  Clinical Data: Chest pain  PORTABLE CHEST - 1 VIEW  Comparison: None.  Findings: Borderline enlarged cardiac silhouette and mediastinal contours, possibly accentuated due to AP projection and rotation. Mild tortuosity of the thoracic aorta.  No focal airspace opacity. No definite pleural effusion or pneumothorax.  No definite evidence of edema.  No definite acute osseous abnormality.  IMPRESSION:  1.  No acute cardiopulmonary disease. 2.  Borderline enlarged cardiac silhouette and mediastinal contours. No definite evidence of edema.   Original Report Authenticated By: Tacey Ruiz, MD     EKG: Independently reviewed. NSR  Assessment/Plan Principal Problem:   Chest pain: atypical. Will admit for observation to rule out. Continue tele, cycle CE and repeat EKG in am. Check echo for LV function. Will provide oxygen as needed as well as NTG as needed. May need stress test but can probably do OP Active Problems:   Chronic back pain: stable at baseline    Code Status: full Family Communication:  Disposition Plan: likely home in am  Time spent: 30 minutes  Gwenyth Bender [facility] Pager [phone]  If 7PM-7AM, please contact night-coverage [email or web address] Password [facility] 01/13/2013, 10:36 AM  Attending note:  Patient independently seen and examined. Above note reviewed. Patient has been admitted with Atypical chest pain.  She does not have any known risk factors.  She is hypertensive on arrival.  Will check lipid panel and hgba1c.  Will cycle cardiac markers. EKG is non acute.  Would likely benefit from outpatient stress  test.  Etiologies of pain may be musculoskeletal since it is somewhat reproducible on palpation, and may also be gastritis related since she does report taking frequent ibuprofen.  Will give trial of GI cocktail and PPI. Will monitor overnight and anticipate discharge home in morning

## 2013-01-13 NOTE — Progress Notes (Signed)
*  PRELIMINARY RESULTS* Echocardiogram 2D Echocardiogram has been performed.  Edynn Gillock 01/13/2013, 8:57 PM  Veda Canning, RDCS

## 2013-01-14 MED ORDER — METOPROLOL TARTRATE 25 MG PO TABS: 25.0000 mg | ORAL_TABLET | Freq: Two times a day (BID) | ORAL | Status: DC

## 2013-01-14 MED ORDER — PANTOPRAZOLE SODIUM 40 MG PO TBEC: 40.0000 mg | DELAYED_RELEASE_TABLET | Freq: Two times a day (BID) | ORAL | Status: DC

## 2013-01-14 MED ORDER — PANTOPRAZOLE SODIUM 40 MG PO TBEC: 40.0000 mg | DELAYED_RELEASE_TABLET | Freq: Every day | ORAL | Status: DC

## 2013-01-14 MED ORDER — PRAVASTATIN SODIUM 10 MG PO TABS: 10.0000 mg | ORAL_TABLET | Freq: Every day | ORAL | Status: DC

## 2013-01-14 MED ORDER — HYDROCODONE-ACETAMINOPHEN 5-325 MG PO TABS: 1.0000 | ORAL_TABLET | ORAL | Status: DC | PRN

## 2013-01-14 MED ORDER — ALUM & MAG HYDROXIDE-SIMETH 200-200-20 MG/5ML PO SUSP: 30.0000 mL | Freq: Four times a day (QID) | ORAL | Status: DC | PRN

## 2013-01-14 NOTE — Progress Notes (Signed)
Discharge Summary: a/o.vss. Saline lock removed. Up ad lib. No complaints of chest pain. Discharge instructions given. Prescriptions given. Pt verbalized understanding of instructions. Pt left floor via wheelchair with nursing staff and family members.

## 2013-01-14 NOTE — Discharge Summary (Signed)
Physician Discharge Summary  Kendra Valenzuela ZOX:[id number] DOB: [birth year] DOA: 01/13/2013  PCP: No PCP Per Patient  Admit date: 01/13/2013 Discharge date: 01/14/2013  Time spent: 40 minutes  Recommendations for Outpatient Follow-up:  1. Has appointment with Dr. Dietrich Pates on 01/21/13 at 3pm for follow up. Follow echo 2. Follow up with primary care doctor in 1-2 weeks.  She has been referred to case management to help arrange this.  Discharge Diagnoses:  Principal Problem:   Chest pain Active Problems:   Chronic back pain   Essential hypertension, benign   Discharge Condition: stable  Diet recommendation: heart healthy  Filed Weights   01/13/13 0741 01/13/13 2118  Weight: 72.576 kg (160 lb) 72.8 kg (160 lb 7.9 oz)    History of present illness:  Kendra Valenzuela is a 52 y.o. female with past medical hx including chronic low back pain and allergies presented to ed on 01/13/13 cc chest pain. Information obtained from pt. Stated she was awakened at 3am with chest pain. Stated pain located substernal area, was constant and sharp. Associated symptoms included diaphoresis and nausea without vomiting. She took aspirin and went back to sleep. She denied palpitations, sob, dizziness, orthopnea, LE swelling. She stated the pain not associated with movement. She awakened at 7am and still had pain. She rated the pain 8/10 even after aspirin. She came to ED was given nitro and pain at time of exam 6/10. She reported having similar episode last month during her aunts funeral. In ED initial troponin neg, EKG NSR, chest xray with no acute cardiopulmonary disease and borderline enlarged cardiac silhouette and mediastinal contours. [facility] asked to admit.   Hospital Course:  Chest pain: atypical. Admited for observation to rule out. No known risk factors. Pain may be musculoskeletal as is somewhat reproducible. Pt also takes ibuprofen frequently due to chronic back pain so may be gastritis.  No events on  tele during hospitalization, cardiac markers neg EKG non-acute. Lipid panel yields triglycerides 221, cholesterol 213 and LDL 104 HgA1c 5.7 and TSH within normal limits.   Echo results pending at discharge. Pt had no further CP. Pt has appointment Dr. Dietrich Pates 01/21/13 for evaluation and follow echo.  May need stress test.  Active Problems:   HTN: no documented hx of same but suspect some ongoing HTN. On admission BP improved with pain control. At discharge BP improved but borderline. Recommend OP follow up for trending.   Chronic back pain: Remained stable at baseline      Procedures: Consultations:  none  Discharge Exam: Filed Vitals:   01/13/13 1037 01/13/13 1434 01/13/13 2118 01/14/13 0430  BP: 195/98 164/91 150/85 153/96  Pulse: 70 94 68 70  Temp: 98.4 F (36.9 C) 98.4 F (36.9 C) 98 F (36.7 C) 97.6 F (36.4 C)  TempSrc: Oral Oral Oral Oral  Resp:  20 20 20   Height: 5\' 6"  (1.676 m)     Weight:   72.8 kg (160 lb 7.9 oz)   SpO2: 100% 99% 100% 100%    General: awake alert oriented x3 Cardiovascular: RRR No MGR No LE edema. PPP Respiratory: normal effort BS clear bilaterally no wheeze no rhonchi Abdomen: soft +BS non-tender to palpation  Discharge Instructions      Discharge Orders   Future Appointments Provider Department Dept Phone   01/21/2013 3:00 PM Kathlen Brunswick, MD [facility] [place] [phone]   Future Orders Complete By Expires     Diet - low sodium heart healthy  As directed  Discharge instructions  As directed     Comments:      Follow up with Dr. Dietrich Pates 01/21/13 at 3pm    Increase activity slowly  As directed         Medication List    STOP taking these medications       ibuprofen 200 MG tablet  Commonly known as:  ADVIL,MOTRIN      TAKE these medications       alum & mag hydroxide-simeth 200-200-20 MG/5ML suspension  Commonly known as:  MAALOX/MYLANTA  Take 30 mLs by mouth every 6 (six) hours as needed.      diphenhydrAMINE 25 MG tablet  Commonly known as:  BENADRYL  Take 25 mg by mouth every 6 (six) hours as needed. For allergy     HYDROcodone-acetaminophen 5-325 MG per tablet  Commonly known as:  NORCO/VICODIN  Take 1-2 tablets by mouth every 4 (four) hours as needed.     pantoprazole 40 MG tablet  Commonly known as:  PROTONIX  Take 1 tablet (40 mg total) by mouth daily.       No Known Allergies Follow-up Information   Follow up with Mat[place] Bing, MD On 01/21/2013. (appointment at 3pm)    Contact information:   [address] [place] [address] [phone]        The results of significant diagnostics from this hospitalization (including imaging, microbiology, ancillary and laboratory) are listed below for reference.    Significant Diagnostic Studies: Dg Chest Portable 1 View  01/13/2013  *RADIOLOGY REPORT*  Clinical Data: Chest pain  PORTABLE CHEST - 1 VIEW  Comparison: None.  Findings: Borderline enlarged cardiac silhouette and mediastinal contours, possibly accentuated due to AP projection and rotation. Mild tortuosity of the thoracic aorta.  No focal airspace opacity. No definite pleural effusion or pneumothorax.  No definite evidence of edema.  No definite acute osseous abnormality.  IMPRESSION:  1.  No acute cardiopulmonary disease. 2.  Borderline enlarged cardiac silhouette and mediastinal contours. No definite evidence of edema.   Original Report Authenticated By: Tacey Ruiz, MD     Microbiology: No results found for this or any previous visit (from the past 240 hour(s)).   Labs: Basic Metabolic Panel:  Recent Labs Lab 01/13/13 0745  NA 141  K 3.5  CL 103  CO2 25  GLUCOSE 110*  BUN 20  CREATININE 0.64  CALCIUM 9.3   Liver Function Tests: No results found for this basename: AST, ALT, ALKPHOS, BILITOT, PROT, ALBUMIN,  in the last 168 hours No results found for this basename: LIPASE, AMYLASE,  in the last 168 hours No results found for this basename:  AMMONIA,  in the last 168 hours CBC:  Recent Labs Lab 01/13/13 0745  WBC 7.7  NEUTROABS 3.7  HGB 12.3  HCT 37.4  MCV 84.8  PLT 347   Cardiac Enzymes:  Recent Labs Lab 01/13/13 0745 01/13/13 1333 01/13/13 2000  TROPONINI <0.30 <0.30 <0.30   BNP: BNP (last 3 results)  Recent Labs  01/13/13 0745  PROBNP 44.6   CBG: No results found for this basename: GLUCAP,  in the last 168 hours     Signed:  Jacoria Keiffer  [facility] 01/14/2013, 11:59 AM   Attending note:  Patient seen and examined. Above note reviewed and agree. Patient was admitted with chest pain. She is ruled out for ACS with negative cardiac markers and nonacute EKG. She is feeling significantly better today. Etiology may be GERD versus musculoskeletal. The patient  was noted to be hypertensive and will be started on a beta blocker. She was also found to have an elevated total cholesterol 213 and LDL of 104. We will start her on low-dose statin. She will follow up with cardiology for further evaluation of her chest pain. She will likely need a stress test since she does have risk factors and has not had any prior cardiac testing. She is felt stable for discharge home.  Kewon Statler

## 2013-01-20 NOTE — Care Management Note (Signed)
    Page 1 of 1   01/20/2013     3:10:15 PM   CARE MANAGEMENT NOTE 01/20/2013  Patient:  Kendra Valenzuela, Kendra Valenzuela   Account Number:  [id number]  Date Initiated:  01/14/2013  Documentation initiated by:  Anibal Henderson  Subjective/Objective Assessment:   Admitted wth chest pain. Lives at home with family     Action/Plan:   Has no PCP- appoinment made with [facility] for followup   Anticipated DC Date:  01/14/2013   Anticipated DC Plan:  HOME/SELF CARE      DC Planning Services  CM consult  [facility]t [facility]      Choice offered to / List presented to:             Status of service:  Completed, signed off Medicare Important Message given?   (If response is "NO", the following Medicare IM given date fields will be blank) Date Medicare IM given:   Date Additional Medicare IM given:    Discharge Disposition:  HOME/SELF CARE  Per UR Regulation:  Reviewed for med. necessity/level of care/duration of stay  If discussed at Long Length of Stay Meetings, dates discussed:    Comments:  01/14/13 1500 Anibal Henderson RN

## 2013-01-21 ENCOUNTER — Encounter: Payer: Self-pay | Admitting: *Deleted

## 2013-01-21 ENCOUNTER — Encounter: Payer: Self-pay | Admitting: Cardiology

## 2013-01-21 ENCOUNTER — Ambulatory Visit (INDEPENDENT_AMBULATORY_CARE_PROVIDER_SITE_OTHER): Payer: Self-pay | Admitting: Adult Health

## 2013-01-21 VITALS — BP 132/92 | HR 100 | Ht 66.0 in | Wt 173.0 lb

## 2013-01-21 DIAGNOSIS — E785 Hyperlipidemia, unspecified: Secondary | ICD-10-CM | POA: Insufficient documentation

## 2013-01-21 DIAGNOSIS — I10 Essential (primary) hypertension: Secondary | ICD-10-CM

## 2013-01-21 DIAGNOSIS — R079 Chest pain, unspecified: Secondary | ICD-10-CM

## 2013-01-21 DIAGNOSIS — E7800 Pure hypercholesterolemia, unspecified: Secondary | ICD-10-CM | POA: Insufficient documentation

## 2013-01-21 DIAGNOSIS — E782 Mixed hyperlipidemia: Secondary | ICD-10-CM | POA: Insufficient documentation

## 2013-01-21 DIAGNOSIS — R0989 Other specified symptoms and signs involving the circulatory and respiratory systems: Secondary | ICD-10-CM

## 2013-01-21 NOTE — Assessment & Plan Note (Signed)
Chest discomfort with typical and atypical features. CVRF of hypertension and hypertension.  She continues to have sharp chest discomfort, fleeting, and some chest pressure with exertion. She will be scheduled for stress myoview for diagnostic/prognostic purposes. I have discussed this with the patient and she verbalizes understanding, is willing to proceed. She will hold metoprolol prior to stress test.

## 2013-01-21 NOTE — Patient Instructions (Addendum)
Your physician recommends that you schedule a follow-up appointment in: post test  Your physician has requested that you have a stress echocardiogram. For further information please visit [email or web address] Please follow instruction sheet as given.  Your physician has requested that you have a carotid duplex. This test is an ultrasound of the carotid arteries in your neck. It looks at blood flow through these arteries that supply the brain with blood. Allow one hour for this exam. There are no restrictions or special instructions.

## 2013-01-21 NOTE — Progress Notes (Deleted)
Name: Kendra Valenzuela    DOB: 1961/06/27  Age: 52 y.o.  MR#: [id number]       PCP:  No PCP Per Patient      Insurance: Payor: / No coverage found.  CC:   No chief complaint on file.   VS Filed Vitals:   01/21/13 1450  BP: 132/92  Pulse: 100  Height: 5\' 6"  (1.676 m)  Weight: 173 lb (78.472 kg)  SpO2: 98%    Weights Current Weight  01/21/13 173 lb (78.472 kg)  01/13/13 160 lb 7.9 oz (72.8 kg)  07/19/12 160 lb (72.576 kg)    Blood Pressure  BP Readings from Last 3 Encounters:  01/21/13 132/92  01/14/13 153/96  07/19/12 152/87     Admit date:  (Not on file) Last encounter with RMR:  Visit date not found   Allergy Review of patient's allergies indicates no known allergies.  Current Outpatient Prescriptions  Medication Sig Dispense Refill  . alum & mag hydroxide-simeth (MAALOX/MYLANTA) 200-200-20 MG/5ML suspension Take 30 mLs by mouth every 6 (six) hours as needed.  355 mL    . diphenhydrAMINE (BENADRYL) 25 MG tablet Take 25 mg by mouth every 6 (six) hours as needed. For allergy      . HYDROcodone-acetaminophen (NORCO/VICODIN) 5-325 MG per tablet Take 1-2 tablets by mouth every 4 (four) hours as needed.  10 tablet  0  . metoprolol tartrate (LOPRESSOR) 25 MG tablet Take 1 tablet (25 mg total) by mouth 2 (two) times daily.  60 tablet  0  . pantoprazole (PROTONIX) 40 MG tablet Take 1 tablet (40 mg total) by mouth daily.  30 tablet  0  . pravastatin (PRAVACHOL) 10 MG tablet Take 1 tablet (10 mg total) by mouth daily.  30 tablet  1   No current facility-administered medications for this visit.    Discontinued Meds:   There are no discontinued medications.  Patient Active Problem List   Diagnosis Date Noted  . Chronic back pain 01/13/2013  . Hypertension 01/13/2013  . Chest pain 01/07/2013    LABS    Component Value Date/Time   NA 141 01/13/2013 0745   K 3.5 01/13/2013 0745   CL 103 01/13/2013 0745   CO2 25 01/13/2013 0745   GLUCOSE 110* 01/13/2013 0745   BUN 20 01/13/2013  0745   CREATININE 0.64 01/13/2013 0745   CALCIUM 9.3 01/13/2013 0745   GFRNONAA >90 01/13/2013 0745   GFRAA >90 01/13/2013 0745   CMP     Component Value Date/Time   NA 141 01/13/2013 0745   K 3.5 01/13/2013 0745   CL 103 01/13/2013 0745   CO2 25 01/13/2013 0745   GLUCOSE 110* 01/13/2013 0745   BUN 20 01/13/2013 0745   CREATININE 0.64 01/13/2013 0745   CALCIUM 9.3 01/13/2013 0745   GFRNONAA >90 01/13/2013 0745   GFRAA >90 01/13/2013 0745       Component Value Date/Time   WBC 7.7 01/13/2013 0745   HGB 12.3 01/13/2013 0745   HCT 37.4 01/13/2013 0745   MCV 84.8 01/13/2013 0745    Lipid Panel     Component Value Date/Time   CHOL 213* 01/13/2013 1333   TRIG 221* 01/13/2013 1333   HDL 65 01/13/2013 1333   CHOLHDL 3.3 01/13/2013 1333   VLDL 44* 01/13/2013 1333   LDLCALC 104* 01/13/2013 1333    ABG No results found for this basename: phart, pco2, pco2art, po2, po2art, hco3, tco2, acidbasedef, o2sat     Lab Results  Component  Value Date   TSH 3.134 01/13/2013   BNP (last 3 results)  Recent Labs  01/13/13 0745  PROBNP 44.6   Cardiac Panel (last 3 results) No results found for this basename: CKTOTAL, CKMB, TROPONINI, RELINDX,  in the last 72 hours  Iron/TIBC/Ferritin No results found for this basename: iron, tibc, ferritin     EKG Orders placed during the hospital encounter of 01/13/13  . ED EKG  . EKG 12-LEAD  . EKG 12-LEAD  . EKG     Prior Assessment and Plan Problem List as of 01/21/2013   Chronic back pain   Hypertension   Chest pain       Imaging: Dg Chest Portable 1 View  01/13/2013   *RADIOLOGY REPORT*  Clinical Data: Chest pain  PORTABLE CHEST - 1 VIEW  Comparison: None.  Findings: Borderline enlarged cardiac silhouette and mediastinal contours, possibly accentuated due to AP projection and rotation. Mild tortuosity of the thoracic aorta.  No focal airspace opacity. No definite pleural effusion or pneumothorax.  No definite evidence of edema.  No definite acute osseous abnormality.   IMPRESSION:  1.  No acute cardiopulmonary disease. 2.  Borderline enlarged cardiac silhouette and mediastinal contours. No definite evidence of edema.   Original Report Authenticated By: Tacey Ruiz, MD

## 2013-01-21 NOTE — Assessment & Plan Note (Signed)
Recent lipid panel demonstrated TC of 213, with LDL of 104. HDL 65. She is recently been placed on pravastatin.

## 2013-01-21 NOTE — Progress Notes (Signed)
HPI: Kendra Valenzuela is a 52 y/o patient are seeing as new patient after admission to [facility] for symptoms of chest pain. Described pain as constant and sharp, with associated diaphoresis, and nausea without vomiting. This has been occuring since 01-13-2023, when her Aunt died. This comes and goes for about 20 minutes. Comes with and without exertion. 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   She was ruled out for ACS, troponin negative, EKG was normal and asked to follow up with cardiology for possible stress test. In the interim she was started on statin, BB, and PPI. She has continued chest discomfort a couple of times since discharge.   No Known Allergies  Current Outpatient Prescriptions  Medication Sig Dispense Refill  . alum & mag hydroxide-simeth (MAALOX/MYLANTA) 200-200-20 MG/5ML suspension Take 30 mLs by mouth every 6 (six) hours as needed.  355 mL    . diphenhydrAMINE (BENADRYL) 25 MG tablet Take 25 mg by mouth every 6 (six) hours as needed. For allergy      . HYDROcodone-acetaminophen (NORCO/VICODIN) 5-325 MG per tablet Take 1-2 tablets by mouth every 4 (four) hours as needed.  10 tablet  0  . metoprolol tartrate (LOPRESSOR) 25 MG tablet Take 1 tablet (25 mg total) by mouth 2 (two) times daily.  60 tablet  0  . pantoprazole (PROTONIX) 40 MG tablet Take 1 tablet (40 mg total) by mouth daily.  30 tablet  0  . pravastatin (PRAVACHOL) 10 MG tablet Take 1 tablet (10 mg total) by mouth daily.  30 tablet  1   No current facility-administered medications for this visit.    Past Medical History  Diagnosis Date  . Environmental allergies   . Back pain   . Chest pain 01/2013    Admitted to [facility] in 01/2013  . Hypertension 01/13/2013    Past Surgical History  Procedure Laterality Date  . Partial hysterectomy      H/o ectopic pregnancy  . Dilation and curettage of uterus      Family History  Problem Relation Age  of Onset  . Hypertension Father     History   Social History  . Marital Status: Legally Separated    Spouse Name: N/A    Number of Children: N/A  . Years of Education: N/A   Occupational History  . 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 Smoking status: Former Games developer  . Smokeless tobacco: Never Used  . Alcohol Use: No  . Drug Use: No  . Sexually Active: Not on file   Other Topics Concern  . Not on file   Social History Narrative  . No narrative on file    ZOX:WRUEAV of systems complete and found to be negative unless listed above   PHYSICAL EXAM BP 132/92  Pulse 100  Ht 5\' 6"  (1.676 m)  Wt 173 lb (78.472 kg)  BMI 27.94 kg/m2  SpO2 98%  General: Well developed, well nourished, in no acute distress Head: Eyes PERRLA, No xanthomas.   Normal cephalic and atramatic  Lungs: Clear bilaterally to auscultation and percussion. Heart: HRRR S1 S2, without MRG.  Pulses are 2+ & equal.            No carotid bruit. No JVD.  No abdominal bruits. No femoral bruits. Abdomen: Bowel sounds are positive, abdomen soft and non-tender without masses or  Hernia's noted.Negative Murphy's sign. Msk:  Back normal, normal gait. Normal strength and tone for age. Extremities: No clubbing, cyanosis or edema.  DP +1 Neuro: Alert and oriented X 3. Psych:  Good affect, responds appropriately    ASSESSMENT AND PLAN

## 2013-01-21 NOTE — Assessment & Plan Note (Signed)
Blood pressure is well controlled at this time with the addition of metoprolol. Hear rate is mildly elevated on this visit. Will not make changes in her BB therapy for now as this is her first visit with [place]. Plan echocardiogram for LV fx or have stress echo completed instead of myoview. TSH was normal during hospital evaluation.

## 2013-01-21 NOTE — Addendum Note (Signed)
Addended by: Derry Lory A on: 01/21/2013 03:57 PM   Modules accepted: Orders, Level of Service

## 2013-01-27 ENCOUNTER — Ambulatory Visit (HOSPITAL_COMMUNITY)
Admission: RE | Admit: 2013-01-27 | Discharge: 2013-01-27 | Disposition: A | Discharge status: Home / Self Care | Payer: Self-pay | Source: Ambulatory Visit | Attending: Cardiology | Admitting: Cardiology

## 2013-01-27 ENCOUNTER — Ambulatory Visit (HOSPITAL_COMMUNITY): Payer: Self-pay | Attending: Cardiology

## 2013-01-27 ENCOUNTER — Encounter (HOSPITAL_COMMUNITY): Payer: Self-pay

## 2013-01-27 DIAGNOSIS — R079 Chest pain, unspecified: Secondary | ICD-10-CM | POA: Insufficient documentation

## 2013-01-27 DIAGNOSIS — I10 Essential (primary) hypertension: Secondary | ICD-10-CM | POA: Insufficient documentation

## 2013-01-27 DIAGNOSIS — E7800 Pure hypercholesterolemia, unspecified: Secondary | ICD-10-CM

## 2013-01-27 DIAGNOSIS — R0989 Other specified symptoms and signs involving the circulatory and respiratory systems: Secondary | ICD-10-CM

## 2013-01-27 NOTE — Progress Notes (Signed)
Stress Lab Nurses Notes - [facility]  GEARL KIMBROUGH 01/27/2013 Reason for doing test: Chest Pain Type of test: Stress Echo Nurse performing test: Parke Poisson, RN Nuclear Medicine Tech: Not Applicable Echo Tech: Karrie Doffing MD performing test: Ival Bible & Jacolyn Reedy PA Family MD: NPCP Test explained and consent signed: yes IV started: No IV started Symptoms: Fatigue in legs & Back Treatment/Intervention: None Reason test stopped: reached target HR After recovery IV was: NA Patient to return to Nuc. Med at :NA Patient discharged: Home Patient's Condition upon discharge was: stable Comments: During test peak BP 153/85 & HR 162.  Recovery BP 130/84 & HR 98.  Symptoms resolved in recovery. Erskine Speed T

## 2013-01-27 NOTE — Progress Notes (Signed)
*  PRELIMINARY RESULTS* Echocardiogram Echocardiogram Stress Test has been performed.  Conrad  01/27/2013, 10:24 AM

## 2013-02-03 ENCOUNTER — Ambulatory Visit (INDEPENDENT_AMBULATORY_CARE_PROVIDER_SITE_OTHER): Payer: Self-pay | Admitting: Adult Health

## 2013-02-03 ENCOUNTER — Encounter: Payer: Self-pay | Admitting: Cardiology

## 2013-02-03 ENCOUNTER — Other Ambulatory Visit: Payer: Self-pay | Admitting: Adult Health

## 2013-02-03 ENCOUNTER — Encounter: Payer: Self-pay | Admitting: Adult Health

## 2013-02-03 VITALS — BP 130/81 | HR 91 | Ht 66.0 in | Wt 174.1 lb

## 2013-02-03 DIAGNOSIS — I10 Essential (primary) hypertension: Secondary | ICD-10-CM

## 2013-02-03 DIAGNOSIS — Z01812 Encounter for preprocedural laboratory examination: Secondary | ICD-10-CM

## 2013-02-03 DIAGNOSIS — R079 Chest pain, unspecified: Secondary | ICD-10-CM

## 2013-02-03 LAB — PROTIME-INR: Prothrombin Time: 12.3 seconds (ref 11.6–15.2)

## 2013-02-03 MED ORDER — NITROGLYCERIN 0.4 MG SL SUBL: 0.4000 mg | SUBLINGUAL_TABLET | SUBLINGUAL | Status: DC | PRN

## 2013-02-03 MED ORDER — ASPIRIN 81 MG PO TABS: 81.0000 mg | ORAL_TABLET | Freq: Every day | ORAL | Status: DC

## 2013-02-03 NOTE — Addendum Note (Signed)
Addended by: Linzie Collin D on: 02/03/2013 02:26 PM   Modules accepted: Orders

## 2013-02-03 NOTE — Addendum Note (Signed)
Addended by: Linzie Collin D on: 02/03/2013 02:35 PM   Modules accepted: Orders

## 2013-02-03 NOTE — Progress Notes (Deleted)
Name: Kendra Valenzuela    DOB: [birth year]  Age: 52 y.o.  MR#: [id number]       PCP:  No PCP Per Patient      Insurance: Payor: / No coverage found.  CC:    Chief Complaint  Patient presents with  . Chest Pain    Still having occasional chest pains associated with heaviness in chest.  . Hyperlipidemia    VS Filed Vitals:   02/03/13 1325  BP: 130/81  Pulse: 91  Height: 5\' 6"  (1.676 m)  Weight: 174 lb 1.9 oz (78.98 kg)  SpO2: 98%    Weights Current Weight  02/03/13 174 lb 1.9 oz (78.98 kg)  01/21/13 173 lb (78.472 kg)  01/13/13 160 lb 7.9 oz (72.8 kg)    Blood Pressure  BP Readings from Last 3 Encounters:  02/03/13 130/81  01/21/13 132/92  01/14/13 153/96     Admit date:  (Not on file) Last encounter with RMR:  01/21/2013   Allergy Review of patient's allergies indicates no known allergies.  Current Outpatient Prescriptions  Medication Sig Dispense Refill  . alum & mag hydroxide-simeth (MAALOX/MYLANTA) 200-200-20 MG/5ML suspension Take 30 mLs by mouth every 6 (six) hours as needed.  355 mL    . diphenhydrAMINE (BENADRYL) 25 MG tablet Take 25 mg by mouth every 6 (six) hours as needed. For allergy      . HYDROcodone-acetaminophen (NORCO/VICODIN) 5-325 MG per tablet Take 1-2 tablets by mouth every 4 (four) hours as needed.  10 tablet  0  . metoprolol tartrate (LOPRESSOR) 25 MG tablet Take 1 tablet (25 mg total) by mouth 2 (two) times daily.  60 tablet  0  . pantoprazole (PROTONIX) 40 MG tablet Take 1 tablet (40 mg total) by mouth daily.  30 tablet  0  . pravastatin (PRAVACHOL) 10 MG tablet Take 1 tablet (10 mg total) by mouth daily.  30 tablet  1   No current facility-administered medications for this visit.    Discontinued Meds:   There are no discontinued medications.  Patient Active Problem List   Diagnosis Date Noted  . Hypercholesterolemia 01/21/2013  . Chronic back pain 01/13/2013  . Hypertension 01/13/2013  . Chest pain 01/07/2013    LABS    Component  Value Date/Time   NA 141 01/13/2013 0745   K 3.5 01/13/2013 0745   CL 103 01/13/2013 0745   CO2 25 01/13/2013 0745   GLUCOSE 110* 01/13/2013 0745   BUN 20 01/13/2013 0745   CREATININE 0.64 01/13/2013 0745   CALCIUM 9.3 01/13/2013 0745   GFRNONAA >90 01/13/2013 0745   GFRAA >90 01/13/2013 0745   CMP     Component Value Date/Time   NA 141 01/13/2013 0745   K 3.5 01/13/2013 0745   CL 103 01/13/2013 0745   CO2 25 01/13/2013 0745   GLUCOSE 110* 01/13/2013 0745   BUN 20 01/13/2013 0745   CREATININE 0.64 01/13/2013 0745   CALCIUM 9.3 01/13/2013 0745   GFRNONAA >90 01/13/2013 0745   GFRAA >90 01/13/2013 0745       Component Value Date/Time   WBC 7.7 01/13/2013 0745   HGB 12.3 01/13/2013 0745   HCT 37.4 01/13/2013 0745   MCV 84.8 01/13/2013 0745    Lipid Panel     Component Value Date/Time   CHOL 213* 01/13/2013 1333   TRIG 221* 01/13/2013 1333   HDL 65 01/13/2013 1333   CHOLHDL 3.3 01/13/2013 1333   VLDL 44* 01/13/2013 1333   LDLCALC 104* 01/13/2013  1333    ABG No results found for this basename: phart, pco2, pco2art, po2, po2art, hco3, tco2, acidbasedef, o2sat     Lab Results  Component Value Date   TSH 3.134 01/13/2013   BNP (last 3 results)  Recent Labs  01/13/13 0745  PROBNP 44.6   Cardiac Panel (last 3 results) No results found for this basename: CKTOTAL, CKMB, TROPONINI, RELINDX,  in the last 72 hours  Iron/TIBC/Ferritin No results found for this basename: iron, tibc, ferritin     EKG Orders placed during the hospital encounter of 01/13/13  . ED EKG  . EKG 12-LEAD  . EKG 12-LEAD  . EKG     Prior Assessment and Plan Problem List as of 02/03/2013     Cardiology Problems   Hypertension   Last Assessment & Plan   01/21/2013 Office Visit Written 01/21/2013  3:38 PM by Jodelle Gross, NP     Blood pressure is well controlled at this time with the addition of metoprolol. Hear rate is mildly elevated on this visit. Will not make changes in her BB therapy for now as this is her first visit with [place]. Plan  echocardiogram for LV fx or have stress echo completed instead of myoview. TSH was normal during hospital evaluation.    Hypercholesterolemia   Last Assessment & Plan   01/21/2013 Office Visit Written 01/21/2013  3:40 PM by Jodelle Gross, NP     Recent lipid panel demonstrated TC of 213, with LDL of 104. HDL 65. She is recently been placed on pravastatin.       Other   Chronic back pain   Chest pain   Last Assessment & Plan   01/21/2013 Office Visit Written 01/21/2013  3:36 PM by Jodelle Gross, NP     Chest discomfort with typical and atypical features. CVRF of hypertension and hypertension.  She continues to have sharp chest discomfort, fleeting, and some chest pressure with exertion. She will be scheduled for stress myoview for diagnostic/prognostic purposes. I have discussed this with the patient and she verbalizes understanding, is willing to proceed. She will hold metoprolol prior to stress test.        Imaging: Dg Chest Portable 1 View  01/13/2013   *RADIOLOGY REPORT*  Clinical Data: Chest pain  PORTABLE CHEST - 1 VIEW  Comparison: None.  Findings: Borderline enlarged cardiac silhouette and mediastinal contours, possibly accentuated due to AP projection and rotation. Mild tortuosity of the thoracic aorta.  No focal airspace opacity. No definite pleural effusion or pneumothorax.  No definite evidence of edema.  No definite acute osseous abnormality.  IMPRESSION:  1.  No acute cardiopulmonary disease. 2.  Borderline enlarged cardiac silhouette and mediastinal contours. No definite evidence of edema.   Original Report Authenticated By: Tacey Ruiz, MD

## 2013-02-03 NOTE — Patient Instructions (Addendum)
Your physician recommends that you have lab work : [facility]  Your physician has recommended you make the following change in your medication:   1. Start Aspirin 81 mg Daily  2. Nitroglycerin .04 mg SL, place 1 tablet under the tongue every 4 minutes as needed for chest pain.  Your physician has requested that you have a Left cardiac catheterization with Dr. Clifton James on 02/08/2013 @ 7:30 am. Cardiac catheterization is used to diagnose and/or treat various heart conditions. Doctors may recommend this procedure for a number of different reasons. The most common reason is to evaluate chest pain. Chest pain can be a symptom of coronary artery disease (CAD), and cardiac catheterization can show whether plaque is narrowing or blocking your heart's arteries. This procedure is also used to evaluate the valves, as well as measure the blood flow and oxygen levels in different parts of your heart. For further information please visit [email or web address] Please follow instruction sheet, as given.

## 2013-02-03 NOTE — Assessment & Plan Note (Signed)
Blood pressure is well controlled at present. Continue current medications with changes as above.

## 2013-02-03 NOTE — Assessment & Plan Note (Signed)
Stress test was abnormal indicating poor functional capcity with blunted BP response, with images suggesting the possibility of distal, inferior, and septal hypokinesis. She continues to have chest discomfort, almost daily now. I have discussed with her the need to proceed with cardiac cath for definitive evaluation of coronary anatomy, with possible PCI. I have discussed risks and benefits of the test to include bleeding, reaction to the dye, MI and death. She is nervous about this but it willing to proceed with diagnostic testing.    In the intermin, I have given her a Rx for NTG and baby ASA 81 mg daily. She is to report to ER for recurrent chest pain of increased severity.

## 2013-02-03 NOTE — Progress Notes (Signed)
   HPI: Kendra Valenzuela is a 52-year-old patient of Dr. Robert Rothbart we are following for ongoing assessment and management of chest pain. We saw her on 01/21/2013 in our office with a planned stress Myoview in the setting of continued discomfort and multiple cardiovascular risk factors.     This was completed on 01/27/2013 with equivocal test results. No no diagnostic ST abnormalities, but she had poor functional capacity with blunted blood pressure response. Images suggested the possibility of distal and inferior septal hypokinesis.Suggest further evaluation ifischemic heart disease remains a signficant diagnostic concern. She has had continued chest discomfort, almost daily now that goes away on its own.    No Known Allergies  Current Outpatient Prescriptions  Medication Sig Dispense Refill  . alum & mag hydroxide-simeth (MAALOX/MYLANTA) 200-200-20 MG/5ML suspension Take 30 mLs by mouth every 6 (six) hours as needed.  355 mL    . diphenhydrAMINE (BENADRYL) 25 MG tablet Take 25 mg by mouth every 6 (six) hours as needed. For allergy      . HYDROcodone-acetaminophen (NORCO/VICODIN) 5-325 MG per tablet Take 1-2 tablets by mouth every 4 (four) hours as needed.  10 tablet  0  . metoprolol tartrate (LOPRESSOR) 25 MG tablet Take 1 tablet (25 mg total) by mouth 2 (two) times daily.  60 tablet  0  . pantoprazole (PROTONIX) 40 MG tablet Take 1 tablet (40 mg total) by mouth daily.  30 tablet  0  . pravastatin (PRAVACHOL) 10 MG tablet Take 1 tablet (10 mg total) by mouth daily.  30 tablet  1   No current facility-administered medications for this visit.    Past Medical History  Diagnosis Date  . Environmental allergies   . Back pain   . Chest pain 01/2013    Admitted to [facility] in 01/2013  . Hypertension 01/13/2013    Past Surgical History  Procedure Laterality Date  . Partial hysterectomy      H/o ectopic pregnancy  . Dilation and curettage of uterus      ROS:Review of systems complete and found  to be negative unless listed above  PHYSICAL EXAM BP 130/81  Pulse 91  Ht 5' 6" (1.676 m)  Wt 174 lb 1.9 oz (78.98 kg)  BMI 28.12 kg/m2  SpO2 98%  General: Well developed, well nourished, in no acute distress Head: Eyes PERRLA, No xanthomas.   Normal cephalic and atramatic  Lungs: Clear bilaterally to auscultation and percussion. Heart: HRRR S1 S2, without MRG.  Pulses are 2+ & equal.            No carotid bruit. No JVD.  No abdominal bruits. No femoral bruits. Abdomen: Bowel sounds are positive, abdomen soft and non-tender without masses or                  Hernia's noted. Msk:  Back normal, normal gait. Normal strength and tone for age. Extremities: No clubbing, cyanosis or edema.  DP +1 Neuro: Alert and oriented X 3. Psych:  Good affect, responds appropriately   ASSESSMENT AND PLAN 

## 2013-02-05 ENCOUNTER — Encounter (HOSPITAL_COMMUNITY): Payer: Self-pay | Admitting: Pharmacy Technician

## 2013-02-08 ENCOUNTER — Encounter (HOSPITAL_COMMUNITY)
Admission: RE | Disposition: A | Discharge status: Home / Self Care | Payer: Self-pay | Source: Ambulatory Visit | Attending: Cardiovascular Disease

## 2013-02-08 ENCOUNTER — Ambulatory Visit (HOSPITAL_COMMUNITY)
Admission: RE | Admit: 2013-02-08 | Discharge: 2013-02-08 | Disposition: A | Discharge status: Home / Self Care | Payer: Self-pay | Source: Ambulatory Visit | Attending: Cardiovascular Disease | Admitting: Cardiovascular Disease

## 2013-02-08 DIAGNOSIS — I771 Stricture of artery: Secondary | ICD-10-CM | POA: Insufficient documentation

## 2013-02-08 DIAGNOSIS — R079 Chest pain, unspecified: Secondary | ICD-10-CM

## 2013-02-08 DIAGNOSIS — R0789 Other chest pain: Secondary | ICD-10-CM | POA: Insufficient documentation

## 2013-02-08 DIAGNOSIS — I10 Essential (primary) hypertension: Secondary | ICD-10-CM | POA: Insufficient documentation

## 2013-02-08 DIAGNOSIS — R9439 Abnormal result of other cardiovascular function study: Secondary | ICD-10-CM | POA: Insufficient documentation

## 2013-02-08 DIAGNOSIS — Z79899 Other long term (current) drug therapy: Secondary | ICD-10-CM | POA: Insufficient documentation

## 2013-02-08 HISTORY — PX: LEFT HEART CATHETERIZATION WITH CORONARY ANGIOGRAM: SHX5451

## 2013-02-08 LAB — BASIC METABOLIC PANEL
CO2: 29 mEq/L (ref 19–32)
Calcium: 9.3 mg/dL (ref 8.4–10.5)
Chloride: 98 mEq/L (ref 96–112)
Sodium: 139 mEq/L (ref 135–145)

## 2013-02-08 LAB — CBC
MCV: 85.8 fL (ref 78.0–100.0)
Platelets: 280 10*3/uL (ref 150–400)
RBC: 4.29 MIL/uL (ref 3.87–5.11)
WBC: 8.8 10*3/uL (ref 4.0–10.5)

## 2013-02-08 SURGERY — LEFT HEART CATHETERIZATION WITH CORONARY ANGIOGRAM
Anesthesia: LOCAL

## 2013-02-08 MED ORDER — SODIUM CHLORIDE 0.9 % IV SOLN: INTRAVENOUS | Status: AC

## 2013-02-08 MED ORDER — MIDAZOLAM HCL 2 MG/2ML IJ SOLN
INTRAMUSCULAR | Status: AC
  Filled 2013-02-08: qty 2

## 2013-02-08 MED ORDER — ASPIRIN 81 MG PO CHEW
324.0000 mg | CHEWABLE_TABLET | ORAL | Status: AC
  Administered 2013-02-08: 324 mg via ORAL

## 2013-02-08 MED ORDER — SODIUM CHLORIDE 0.9 % IJ SOLN: 3.0000 mL | INTRAMUSCULAR | Status: DC | PRN

## 2013-02-08 MED ORDER — LIDOCAINE HCL (PF) 1 % IJ SOLN
INTRAMUSCULAR | Status: AC
  Filled 2013-02-08: qty 30

## 2013-02-08 MED ORDER — SODIUM CHLORIDE 0.9 % IV SOLN: 250.0000 mL | INTRAVENOUS | Status: DC | PRN

## 2013-02-08 MED ORDER — ASPIRIN 81 MG PO CHEW
CHEWABLE_TABLET | ORAL | Status: AC
  Filled 2013-02-08: qty 4

## 2013-02-08 MED ORDER — SODIUM CHLORIDE 0.9 % IJ SOLN: 3.0000 mL | Freq: Two times a day (BID) | INTRAMUSCULAR | Status: DC

## 2013-02-08 MED ORDER — DIAZEPAM 5 MG PO TABS
5.0000 mg | ORAL_TABLET | ORAL | Status: AC
Start: 2013-02-08 — End: 2013-02-08
  Administered 2013-02-08: 5 mg via ORAL

## 2013-02-08 MED ORDER — ACETAMINOPHEN 325 MG PO TABS: 650.0000 mg | ORAL_TABLET | ORAL | Status: DC | PRN

## 2013-02-08 MED ORDER — VERAPAMIL HCL 2.5 MG/ML IV SOLN
INTRAVENOUS | Status: AC
  Filled 2013-02-08: qty 2

## 2013-02-08 MED ORDER — FENTANYL CITRATE 0.05 MG/ML IJ SOLN
INTRAMUSCULAR | Status: AC
  Filled 2013-02-08: qty 2

## 2013-02-08 MED ORDER — DIAZEPAM 5 MG PO TABS
ORAL_TABLET | ORAL | Status: AC
  Filled 2013-02-08: qty 1

## 2013-02-08 MED ORDER — MIDAZOLAM HCL 2 MG/2ML IJ SOLN
INTRAMUSCULAR | Status: AC
Start: 2013-02-08 — End: 2013-02-08
  Filled 2013-02-08: qty 2

## 2013-02-08 MED ORDER — ONDANSETRON HCL 4 MG/2ML IJ SOLN: 4.0000 mg | Freq: Four times a day (QID) | INTRAMUSCULAR | Status: DC | PRN

## 2013-02-08 MED ORDER — HEPARIN (PORCINE) IN NACL 2-0.9 UNIT/ML-% IJ SOLN
INTRAMUSCULAR | Status: AC
  Filled 2013-02-08: qty 1000

## 2013-02-08 MED ORDER — SODIUM CHLORIDE 0.9 % IV SOLN
INTRAVENOUS | Status: DC
  Administered 2013-02-08: 06:00:00 via INTRAVENOUS

## 2013-02-08 NOTE — Interval H&P Note (Signed)
History and Physical Interval Note:  02/08/2013 7:38 AM  Kendra Valenzuela  has presented today for cardiac cath with the diagnosis of Chest pain, abnormal stress test. The various methods of treatment have been discussed with the patient and family. After consideration of risks, benefits and other options for treatment, the patient has consented to  Procedure(s): LEFT HEART CATHETERIZATION WITH CORONARY ANGIOGRAM (N/A) as a surgical intervention .  The patient's history has been reviewed, patient examined, no change in status, stable for surgery.  I have reviewed the patient's chart and labs.  Questions were answered to the patient's satisfaction.     MCALHANY,CHRISTOPHER

## 2013-02-08 NOTE — CV Procedure (Signed)
    Cardiac Catheterization Operative Report  Kendra Valenzuela [id number] 6/2/20148:40 AM No PCP Per Patient  Procedure Performed:  1. Left Heart Catheterization 2. Selective Coronary Angiography 3. Left ventricular angiogram 4. Mynx femoral artery closure device RFA  Operator: Verne Carrow, MD  Arterial access site:  Right radial artery.   Indication: 52 yo female with history of HTN with recent chest pain. Stress echo showed possible inferior wall HK with exercise.                                       Procedure Details: The risks, benefits, complications, treatment options, and expected outcomes were discussed with the patient. The patient and/or family concurred with the proposed plan, giving informed consent. The patient was brought to the cath lab after IV hydration was begun and oral premedication was given. The patient was further sedated with Versed and Fentanyl. The right wrist was assessed with an Allens test which was positive. The right wrist was prepped and draped in a sterile fashion. 1% lidocaine was used for local anesthesia. Using the modified Seldinger access technique, a 5 French sheath was placed in the right radial artery. 3 mg Verapamil was given through the sheath. 4000 units IV heparin was given. The patient has a very tortuous right subclavian artery and our catheters could not be manipulated to engage the left main or RCA. I elected to obtain access in the right femoral artery. The right groin had been prepped and draped. 1% lidocaine was used for local anesthesia. I then engaged the right femoral artery using modified Seldinger technique. A 5 French sheath was placed in the right femoral artery. Standard diagnostic catheters were used to perform selective coronary angiography. A pigtail catheter was used to perform a left ventricular angiogram. The sheath was removed from the right radial artery and a Terumo hemostasis band was applied at the arteriotomy  site on the right wrist. A Mynx femoral artery closure device was placed in the right femoral arteriotomy.   There were no immediate complications. The patient was taken to the recovery area in stable condition.   Hemodynamic Findings: Central aortic pressure: 103/59 Left ventricular pressure: 103/6/9  Angiographic Findings:  Left main: No obstructive disease.   Left Anterior Descending Artery: Large caliber vessel that courses to the apex. There are several small caliber diagonal branches. No obstructive disease.  Circumflex Artery: Large caliber, dominant vessel with several small obtuse marginal branches. No obstructive disease.   Right Coronary Artery: Small caliber, non-dominant vessel with no obstructive disease.   Left Ventricular Angiogram: LVEF 55-60%  Impression: 1. No angiographic evidence of CAD 2. Normal LV systolic function 3. Non-cardiac chest pain   Recommendations: No further ischemic workup.        Complications:  None. The patient tolerated the procedure well.

## 2013-02-08 NOTE — Progress Notes (Signed)
Pt ambulatory in hall, tolerated well.  Will continue to monitor

## 2013-02-08 NOTE — H&P (View-Only) (Signed)
   HPI: Kendra Valenzuela is a 52 year old patient of Dr. [place] Bing we are following for ongoing assessment and management of chest pain. We saw her on 01/21/2013 in our office with a planned stress Myoview in the setting of continued discomfort and multiple cardiovascular risk factors.     This was completed on 01/27/2013 with equivocal test results. No no diagnostic ST abnormalities, but she had poor functional capacity with blunted blood pressure response. Images suggested the possibility of distal and inferior septal hypokinesis.Suggest further evaluation ifischemic heart disease remains a signficant diagnostic concern. She has had continued chest discomfort, almost daily now that goes away on its own.    No Known Allergies  Current Outpatient Prescriptions  Medication Sig Dispense Refill  . alum & mag hydroxide-simeth (MAALOX/MYLANTA) 200-200-20 MG/5ML suspension Take 30 mLs by mouth every 6 (six) hours as needed.  355 mL    . diphenhydrAMINE (BENADRYL) 25 MG tablet Take 25 mg by mouth every 6 (six) hours as needed. For allergy      . HYDROcodone-acetaminophen (NORCO/VICODIN) 5-325 MG per tablet Take 1-2 tablets by mouth every 4 (four) hours as needed.  10 tablet  0  . metoprolol tartrate (LOPRESSOR) 25 MG tablet Take 1 tablet (25 mg total) by mouth 2 (two) times daily.  60 tablet  0  . pantoprazole (PROTONIX) 40 MG tablet Take 1 tablet (40 mg total) by mouth daily.  30 tablet  0  . pravastatin (PRAVACHOL) 10 MG tablet Take 1 tablet (10 mg total) by mouth daily.  30 tablet  1   No current facility-administered medications for this visit.    Past Medical History  Diagnosis Date  . Environmental allergies   . Back pain   . Chest pain 01/2013    Admitted to [facility] in 01/2013  . Hypertension 01/13/2013    Past Surgical History  Procedure Laterality Date  . Partial hysterectomy      H/o ectopic pregnancy  . Dilation and curettage of uterus      MWN:UUVOZD of systems complete and found  to be negative unless listed above  PHYSICAL EXAM BP 130/81  Pulse 91  Ht 5\' 6"  (1.676 m)  Wt 174 lb 1.9 oz (78.98 kg)  BMI 28.12 kg/m2  SpO2 98%  General: Well developed, well nourished, in no acute distress Head: Eyes PERRLA, No xanthomas.   Normal cephalic and atramatic  Lungs: Clear bilaterally to auscultation and percussion. Heart: HRRR S1 S2, without MRG.  Pulses are 2+ & equal.            No carotid bruit. No JVD.  No abdominal bruits. No femoral bruits. Abdomen: Bowel sounds are positive, abdomen soft and non-tender without masses or                  Hernia's noted. Msk:  Back normal, normal gait. Normal strength and tone for age. Extremities: No clubbing, cyanosis or edema.  DP +1 Neuro: Alert and oriented X 3. Psych:  Good affect, responds appropriately   ASSESSMENT AND PLAN

## 2013-03-02 ENCOUNTER — Emergency Department (HOSPITAL_COMMUNITY)
Admission: EM | Admit: 2013-03-02 | Discharge: 2013-03-02 | Disposition: A | Discharge status: Home / Self Care | Payer: Self-pay | Attending: Emergency Medicine | Admitting: Emergency Medicine

## 2013-03-02 ENCOUNTER — Encounter (HOSPITAL_COMMUNITY): Payer: Self-pay

## 2013-03-02 ENCOUNTER — Emergency Department (HOSPITAL_COMMUNITY): Payer: Self-pay

## 2013-03-02 DIAGNOSIS — Z79899 Other long term (current) drug therapy: Secondary | ICD-10-CM | POA: Insufficient documentation

## 2013-03-02 DIAGNOSIS — R079 Chest pain, unspecified: Secondary | ICD-10-CM | POA: Insufficient documentation

## 2013-03-02 DIAGNOSIS — I10 Essential (primary) hypertension: Secondary | ICD-10-CM | POA: Insufficient documentation

## 2013-03-02 DIAGNOSIS — Z87891 Personal history of nicotine dependence: Secondary | ICD-10-CM | POA: Insufficient documentation

## 2013-03-02 DIAGNOSIS — M542 Cervicalgia: Secondary | ICD-10-CM | POA: Insufficient documentation

## 2013-03-02 LAB — CBC WITH DIFFERENTIAL/PLATELET
Basophils Absolute: 0 10*3/uL (ref 0.0–0.1)
Basophils Relative: 0 % (ref 0–1)
Eosinophils Absolute: 0.2 10*3/uL (ref 0.0–0.7)
MCH: 28 pg (ref 26.0–34.0)
MCHC: 31.9 g/dL (ref 30.0–36.0)
Neutrophils Relative %: 62 % (ref 43–77)
Platelets: 373 10*3/uL (ref 150–400)
RBC: 4.15 MIL/uL (ref 3.87–5.11)
RDW: 15.2 % (ref 11.5–15.5)

## 2013-03-02 LAB — BASIC METABOLIC PANEL
GFR calc Af Amer: >90 mL/min (ref >90)
GFR calc non Af Amer: >90 mL/min (ref >90)
Potassium: 3.7 mEq/L (ref 3.5–5.1)
Sodium: 141 mEq/L (ref 135–145)

## 2013-03-02 MED ORDER — TRAMADOL HCL 50 MG PO TABS
50.0000 mg | ORAL_TABLET | Freq: Once | ORAL | Status: AC
  Administered 2013-03-02: 50 mg via ORAL
  Filled 2013-03-02: qty 1

## 2013-03-02 MED ORDER — ASPIRIN 81 MG PO CHEW
324.0000 mg | CHEWABLE_TABLET | Freq: Once | ORAL | Status: AC
  Administered 2013-03-02: 324 mg via ORAL
  Filled 2013-03-02: qty 4

## 2013-03-02 MED ORDER — TRAMADOL HCL 50 MG PO TABS: 50.0000 mg | ORAL_TABLET | Freq: Four times a day (QID) | ORAL | Status: DC | PRN

## 2013-03-02 NOTE — ED Notes (Addendum)
Pt reports had sharp right sided chest pain that radiated to shoulder blade Friday. Reports pain went away several hours after taking 2 extra strength tylenol and resting.  Reports around 0820 the pain returned.  Pt says pain has been constant.  Pt says area in ri chest is sore to touch and movement also aggravates the pain.  Pt had cardiac cath performed on 6/2.  Pt unsure if any stents were placed at that time.

## 2013-03-02 NOTE — ED Notes (Signed)
Patient with no complaints at this time. Respirations even and unlabored. Skin warm/dry. Discharge instructions reviewed with patient at this time. Patient given opportunity to voice concerns/ask questions. Patient discharged at this time and left Emergency Department with steady gait.   

## 2013-03-02 NOTE — ED Provider Notes (Signed)
History  This chart was scribed for Shelda Jakes, MD by Ardelia Mems, ED Scribe. This patient was seen in room APA02/APA02 and the patient's care was started at 11:39 AM.   CSN: [id number]  Arrival date & time 03/02/13  7829     Chief Complaint  Patient presents with  . Chest Pain    The history is provided by the patient. No language interpreter was used.   HPI Comments: Kendra Valenzuela is a 52 y.o. female with a hx of HTN and recent chest pain (admitted to [facility] in 01/2013) who presents to the Emergency Department complaining of constant, severe "sharp", "8/10", right-sided chest pain that radiates to her right shoulder blade and right neck that began 4 days ago. Pt states that she took 2 Tylenol and rested which relieved the pain 4 days ago, until this morning when pt reports an onset of the same chest pain that is still present. Pt states that her current pain is similar to the chest pain she was admitted to [facility] for in May, except that the pain was left-sided in May and is right-sided now. Pt denies nausea, vomiting, SOB or diaphoresis as associated symptoms. Pt states that her pain is worsened with coughing. Pt states that she has been taking 81 mg ASA daily since May, but she did not take ASA this morning. Pt states that she had a cardiac catheterization performed on 02/08/13. Pt states that stents were not placed and states that she believes everything resulted normal. Pt states that a follow-up was not arranged, and she was not started on any new medications. Pt had a normal EKG in ED today. Pt states that she sees the cardiologist at [facility].  Pt also denies fever, SOB, abdominal pain, dysuria or any other symptoms.  PCP- None   Past Medical History  Diagnosis Date  . Environmental allergies   . Back pain   . Chest pain 01/2013    Admitted to [facility] in 01/2013  . Hypertension 01/13/2013   Past Surgical History  Procedure Laterality Date  . Partial hysterectomy      H/o  ectopic pregnancy  . Dilation and curettage of uterus    . Cardiac catheterization     Family History  Problem Relation Age of Onset  . Hypertension Father    History  Substance Use Topics  . Smoking status: Former Games developer  . Smokeless tobacco: Never Used  . Alcohol Use: No   OB History   Grav Para Term Preterm Abortions TAB SAB Ect Mult Living                 Review of Systems  Constitutional: Negative for fever, chills and diaphoresis.  HENT: Positive for neck pain. Negative for congestion, sore throat and rhinorrhea.   Eyes: Negative for visual disturbance.  Respiratory: Negative for cough and shortness of breath.   Cardiovascular: Positive for chest pain. Negative for leg swelling.  Gastrointestinal: Negative for nausea, vomiting, abdominal pain and diarrhea.  Genitourinary: Negative for dysuria.  Musculoskeletal: Negative for back pain.  Skin: Negative for rash.  Neurological: Negative for headaches.  Hematological: Does not bruise/bleed easily.  Psychiatric/Behavioral: Negative for confusion.   A complete 10 system review of systems was obtained and all systems are negative except as noted in the HPI and PMH.   Allergies  Review of patient's allergies indicates no known allergies.  Home Medications   Current Outpatient Rx  Name  Route  Sig  Dispense  Refill  . acetaminophen (TYLENOL) 500 MG tablet   Oral   Take 1,000 mg by mouth every 6 (six) hours as needed for pain.         Marland Kitchen alum & mag hydroxide-simeth (MAALOX/MYLANTA) 200-200-20 MG/5ML suspension   Oral   Take 30 mLs by mouth every 6 (six) hours as needed. For indigestion         . aspirin 81 MG tablet   Oral   Take 1 tablet (81 mg total) by mouth daily.   30 tablet   6   . diphenhydrAMINE (BENADRYL) 25 MG tablet   Oral   Take 25 mg by mouth every 6 (six) hours as needed for itching or allergies. For allergy         . HYDROcodone-acetaminophen (NORCO/VICODIN) 5-325 MG per tablet   Oral    Take 1 tablet by mouth every 6 (six) hours as needed for pain.         . metoprolol tartrate (LOPRESSOR) 25 MG tablet   Oral   Take 1 tablet (25 mg total) by mouth 2 (two) times daily.   60 tablet   0   . pantoprazole (PROTONIX) 40 MG tablet   Oral   Take 1 tablet (40 mg total) by mouth daily.   30 tablet   0   . pravastatin (PRAVACHOL) 10 MG tablet   Oral   Take 1 tablet (10 mg total) by mouth daily.   30 tablet   1    Triage Vitals: BP 134/80  Pulse 86  Temp(Src) 98.6 F (37 C) (Oral)  Resp 24  Ht 5\' 6"  (1.676 m)  Wt 173 lb (78.472 kg)  BMI 27.94 kg/m2  SpO2 100%  Physical Exam  Nursing note and vitals reviewed. Constitutional: She is oriented to person, place, and time. She appears well-developed and well-nourished.  HENT:  Head: Normocephalic and atraumatic.  Eyes: Conjunctivae and EOM are normal. Pupils are equal, round, and reactive to light.  Neck: Normal range of motion. Neck supple. No tracheal deviation present.  Cardiovascular: Normal rate, regular rhythm and normal heart sounds.   No murmur heard. Pulmonary/Chest: Breath sounds normal. No respiratory distress. She has no wheezes.  Room air oxygen saturation is 100%.  Abdominal: Soft. Bowel sounds are normal. There is no tenderness.  Musculoskeletal: Normal range of motion. She exhibits no tenderness.  No ankle swelling.  Neurological: She is alert and oriented to person, place, and time.  Skin: Skin is warm and dry. No rash noted.  Psychiatric: She has a normal mood and affect.    ED Course  Procedures (including critical care time)  DIAGNOSTIC STUDIES: Oxygen Saturation is 100% on RA, normal by my interpretation.    COORDINATION OF CARE: 11:57 AM- Pt advised of plan to receive Tramdol and pt agrees.  Medications  traMADol (ULTRAM) tablet 50 mg (50 mg Oral Given 03/02/13 1206)  aspirin chewable tablet 324 mg (324 mg Oral Given 03/02/13 1206)    Labs Reviewed  CBC WITH DIFFERENTIAL -  Abnormal; Notable for the following:    Hemoglobin 11.6 (*)    All other components within normal limits  BASIC METABOLIC PANEL - Abnormal; Notable for the following:    Glucose, Bld 139 (*)    All other components within normal limits  TROPONIN I   Dg Chest 2 View  03/02/2013   *RADIOLOGY REPORT*  Clinical Data: Right-sided chest pain.  CHEST - 2 VIEW  Comparison: 01/13/2013.  Findings: Monitoring leads are  projected over the chest. Cardiopericardial silhouette is borderline in size for projection. There is no airspace disease.  No pleural effusion. No pneumothorax.  Mediastinal contours appear unchanged compared to prior exam.  IMPRESSION: No interval change or acute cardiopulmonary disease.  Borderline heart size.   Original Report Authenticated By: Andreas Newport, M.D.   No diagnosis found.  Results for orders placed during the hospital encounter of 03/02/13  CBC WITH DIFFERENTIAL      Result Value Range   WBC 7.4  4.0 - 10.5 K/uL   RBC 4.15  3.87 - 5.11 MIL/uL   Hemoglobin 11.6 (*) 12.0 - 15.0 g/dL   HCT 40.9  81.1 - 91.4 %   MCV 87.7  78.0 - 100.0 fL   MCH 28.0  26.0 - 34.0 pg   MCHC 31.9  30.0 - 36.0 g/dL   RDW 78.2  95.6 - 21.3 %   Platelets 373  150 - 400 K/uL   Neutrophils Relative % 62  43 - 77 %   Neutro Abs 4.6  1.7 - 7.7 K/uL   Lymphocytes Relative 32  12 - 46 %   Lymphs Abs 2.3  0.7 - 4.0 K/uL   Monocytes Relative 4  3 - 12 %   Monocytes Absolute 0.3  0.1 - 1.0 K/uL   Eosinophils Relative 2  0 - 5 %   Eosinophils Absolute 0.2  0.0 - 0.7 K/uL   Basophils Relative 0  0 - 1 %   Basophils Absolute 0.0  0.0 - 0.1 K/uL  BASIC METABOLIC PANEL      Result Value Range   Sodium 141  135 - 145 mEq/L   Potassium 3.7  3.5 - 5.1 mEq/L   Chloride 102  96 - 112 mEq/L   CO2 27  19 - 32 mEq/L   Glucose, Bld 139 (*) 70 - 99 mg/dL   BUN 22  6 - 23 mg/dL   Creatinine, Ser 0.86  0.50 - 1.10 mg/dL   Calcium 9.5  8.4 - 57.8 mg/dL   GFR calc non Af Amer >90  >90 mL/min   GFR calc  Af Amer >90  >90 mL/min  TROPONIN I      Result Value Range   Troponin I <0.30  <0.30 ng/mL    Date: 03/02/2013  Rate: 100  Rhythm: normal sinus rhythm  QRS Axis: normal  Intervals: normal  ST/T Wave abnormalities: normal  Conduction Disutrbances:none  Narrative Interpretation:   Old EKG Reviewed: none available    MDM  Cardiology help the spine the results of the cardiac cath in June 2. Was completely normal no scar R. disease at all. Followup with cardiology was 90 been recommended. Patient's chest pain is noncardiac in nature. Today's workup without any significant lab abnormalities. No evidence of anemia or significant leukocytosis chest x-rays negative for pneumonia or pulmonary edema or pneumothorax. Today's EKG without any acute changes. Today's troponin negative. Noncardiac chest pain will give patient resource guide to help him followup with her regular Dr.        I personally performed the services described in this documentation, which was scribed in my presence. The recorded information has been reviewed and is accurate.       Shelda Jakes, MD 03/02/13 [phone]

## 2013-07-21 ENCOUNTER — Emergency Department (HOSPITAL_COMMUNITY)
Admission: EM | Admit: 2013-07-21 | Discharge: 2013-07-21 | Disposition: A | Discharge status: Home / Self Care | Payer: BC Managed Care – PPO | Attending: Emergency Medicine | Admitting: Emergency Medicine

## 2013-07-21 ENCOUNTER — Encounter (HOSPITAL_COMMUNITY): Payer: Self-pay | Admitting: Emergency Medicine

## 2013-07-21 ENCOUNTER — Emergency Department (HOSPITAL_COMMUNITY): Payer: BC Managed Care – PPO

## 2013-07-21 DIAGNOSIS — Z87891 Personal history of nicotine dependence: Secondary | ICD-10-CM | POA: Insufficient documentation

## 2013-07-21 DIAGNOSIS — Z7982 Long term (current) use of aspirin: Secondary | ICD-10-CM | POA: Insufficient documentation

## 2013-07-21 DIAGNOSIS — M549 Dorsalgia, unspecified: Secondary | ICD-10-CM

## 2013-07-21 DIAGNOSIS — R5381 Other malaise: Secondary | ICD-10-CM | POA: Insufficient documentation

## 2013-07-21 DIAGNOSIS — I10 Essential (primary) hypertension: Secondary | ICD-10-CM | POA: Insufficient documentation

## 2013-07-21 DIAGNOSIS — Z95818 Presence of other cardiac implants and grafts: Secondary | ICD-10-CM | POA: Insufficient documentation

## 2013-07-21 DIAGNOSIS — Z79899 Other long term (current) drug therapy: Secondary | ICD-10-CM | POA: Insufficient documentation

## 2013-07-21 DIAGNOSIS — R0789 Other chest pain: Secondary | ICD-10-CM | POA: Insufficient documentation

## 2013-07-21 DIAGNOSIS — R110 Nausea: Secondary | ICD-10-CM | POA: Insufficient documentation

## 2013-07-21 DIAGNOSIS — R51 Headache: Secondary | ICD-10-CM | POA: Insufficient documentation

## 2013-07-21 DIAGNOSIS — M546 Pain in thoracic spine: Secondary | ICD-10-CM | POA: Insufficient documentation

## 2013-07-21 LAB — PRO B NATRIURETIC PEPTIDE: Pro B Natriuretic peptide (BNP): 28.2 pg/mL (ref 0–125)

## 2013-07-21 LAB — TROPONIN I: Troponin I: <0.3 ng/mL (ref <0.30)

## 2013-07-21 LAB — CBC WITH DIFFERENTIAL/PLATELET
Basophils Absolute: 0 10*3/uL (ref 0.0–0.1)
Eosinophils Absolute: 0.2 10*3/uL (ref 0.0–0.7)
Lymphocytes Relative: 35 % (ref 12–46)
Lymphs Abs: 2.9 10*3/uL (ref 0.7–4.0)
Neutrophils Relative %: 55 % (ref 43–77)
Platelets: 304 10*3/uL (ref 150–400)
RBC: 4.04 MIL/uL (ref 3.87–5.11)
RDW: 15.2 % (ref 11.5–15.5)
WBC: 8.1 10*3/uL (ref 4.0–10.5)

## 2013-07-21 LAB — D-DIMER, QUANTITATIVE: D-Dimer, Quant: <0.27 ug/mL-FEU (ref 0.00–0.48)

## 2013-07-21 LAB — BASIC METABOLIC PANEL
CO2: 26 mEq/L (ref 19–32)
GFR calc non Af Amer: >90 mL/min (ref >90)
Glucose, Bld: 117 mg/dL — ABNORMAL HIGH (ref 70–99)
Potassium: 3.3 mEq/L — ABNORMAL LOW (ref 3.5–5.1)
Sodium: 140 mEq/L (ref 135–145)

## 2013-07-21 MED ORDER — MORPHINE SULFATE 4 MG/ML IJ SOLN
4.0000 mg | Freq: Once | INTRAMUSCULAR | Status: AC
  Administered 2013-07-21: 4 mg via INTRAVENOUS
  Filled 2013-07-21: qty 1

## 2013-07-21 MED ORDER — ONDANSETRON HCL 4 MG/2ML IJ SOLN
4.0000 mg | Freq: Once | INTRAMUSCULAR | Status: AC
  Administered 2013-07-21: 4 mg via INTRAVENOUS
  Filled 2013-07-21: qty 2

## 2013-07-21 MED ORDER — NAPROXEN 500 MG PO TABS: 500.0000 mg | ORAL_TABLET | Freq: Two times a day (BID) | ORAL | Status: DC

## 2013-07-21 NOTE — ED Provider Notes (Signed)
CSN: [id number]     Arrival date & time 07/21/13  1614 History  This chart was scribed for Glynn Octave, MD by Blanchard Kelch, ED Scribe. The patient was seen in room APA12/APA12. Patient's care was started at 4:44 PM.    Chief Complaint  Patient presents with  . Chest Pain  . Nausea  . Back Pain   Patient is a 52 y.o. female presenting with chest pain and back pain. The history is provided by the patient. No language interpreter was used.  Chest Pain Associated symptoms: back pain   Back Pain Associated symptoms: chest pain     HPI Comments: KELIA GIBBON is a 52 y.o. female who presents to the Emergency Department complaining of constant, unchanged upper back pain that began this morning when she woke up. She is complaining of associated nausea,headache and weakness. She states the pain wasn't there when she went to bed last night. She has had similar episodes of pain in the past, but it has never lasted this long before. 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 She was also having right sided chest pain for about a second at work today, but it has since subsided. She has tried Tylenol for the pain without relief. She denies any other alleviating or aggravating factors. She denies abdominal pain, fever, vomiting, dysuria, or hematuria. She has a past medical history of hypertension. She has a past surgical history of cardiac catheterization.  Pt denies having a PCP currently.  Past Medical History  Diagnosis Date  . Environmental allergies   . Back pain   . Chest pain 01/2013    Admitted to [facility] in 01/2013  . Hypertension 01/13/2013   Past Surgical History  Procedure Laterality Date  . Dilation and curettage of uterus    . Cardiac catheterization     Family History  Problem Relation Age of Onset  . Hypertension Father    History  Substance Use Topics  . Smoking status: Former Games developer  . Smokeless tobacco: Never  Used  . Alcohol Use: No   OB History   Grav Para Term Preterm Abortions TAB SAB Ect Mult Living                 Review of Systems  Cardiovascular: Positive for chest pain.  Musculoskeletal: Positive for back pain.   A complete 10 system review of systems was obtained and all systems are negative except as noted in the HPI and PMH.    Allergies  Review of patient's allergies indicates no known allergies.  Home Medications   Current Outpatient Rx  Name  Route  Sig  Dispense  Refill  . acetaminophen (TYLENOL) 500 MG tablet   Oral   Take 1,000 mg by mouth every 6 (six) hours as needed for pain.         Marland Kitchen aspirin EC 81 MG tablet   Oral   Take 81 mg by mouth daily.         . diphenhydrAMINE (BENADRYL) 25 MG tablet   Oral   Take 50 mg by mouth at bedtime as needed and may repeat dose one time if needed for allergies or sleep. For allergy         . naproxen sodium (ALEVE) 220 MG tablet   Oral   Take 220-440 mg by mouth daily as needed (for pain).         . naproxen (NAPROSYN)  500 MG tablet   Oral   Take 1 tablet (500 mg total) by mouth 2 (two) times daily.   30 tablet   0    Triage Vitals: BP 168/90  Pulse 83  Temp(Src) 98.6 F (37 C) (Oral)  Resp 20  Ht 5\' 6"  (1.676 m)  Wt 163 lb (73.936 kg)  BMI 26.32 kg/m2  SpO2 100%  Physical Exam  Nursing note and vitals reviewed. Constitutional: She is oriented to person, place, and time. She appears well-developed and well-nourished. No distress.  HENT:  Head: Normocephalic and atraumatic.  Mouth/Throat: Oropharynx is clear and moist. No oropharyngeal exudate.  Eyes: Conjunctivae and EOM are normal. Pupils are equal, round, and reactive to light.  Neck: Normal range of motion. Neck supple.  Cardiovascular: Normal rate and regular rhythm.   No murmur heard. Equal radial and DP pulses.   Pulmonary/Chest: Effort normal and breath sounds normal. No respiratory distress.  Abdominal: Soft. She exhibits no  distension. There is no tenderness.  Musculoskeletal: Normal range of motion. She exhibits tenderness.  Paraspinal thoracic pain.   Neurological: She is alert and oriented to person, place, and time.  CN 2-12 intact, no ataxia on finger to nose, no nystagmus, 5/5 strength throughout, no pronator drift, Romberg negative, normal gait.   Skin: Skin is warm.    ED Course  Procedures (including critical care time)  DIAGNOSTIC STUDIES: Oxygen Saturation is 100% on room air, normal by my interpretation.    COORDINATION OF CARE: 4:50 PM -Will order CBC, BMP, Troponin I, and chest x-ray. Patient verbalizes understanding and agrees with treatment plan.  5:57- Patient is feeling better. Will order another Troponin I and discharge if negative. Pt verbalizes understanding and agrees with treatment plan.    Labs Review Labs Reviewed  CBC WITH DIFFERENTIAL - Abnormal; Notable for the following:    Hemoglobin 11.0 (*)    HCT 34.8 (*)    All other components within normal limits  BASIC METABOLIC PANEL - Abnormal; Notable for the following:    Potassium 3.3 (*)    Glucose, Bld 117 (*)    All other components within normal limits  TROPONIN I  D-DIMER, QUANTITATIVE  PRO B NATRIURETIC PEPTIDE   Imaging Review Dg Chest 2 View  07/21/2013   CLINICAL DATA:  Chest pain.  EXAM: CHEST  2 VIEW  COMPARISON:  03/02/2013.  FINDINGS: The cardiac silhouette, mediastinal and hilar contours are within normal limits and stable. The lungs are clear. No pleural effusion. The bony thorax is intact.  IMPRESSION: No acute cardiopulmonary findings.   Electronically Signed   By: Loralie Champagne M.D.   On: 07/21/2013 17:13    EKG Interpretation     Ventricular Rate:  87 PR Interval:  190 QRS Duration: 116 QT Interval:  390 QTC Calculation: 469 R Axis:   29 Text Interpretation:  Normal sinus rhythm Incomplete right bundle branch block Borderline ECG When compared with ECG of 02-Mar-2013 09:52, No significant  change was found            MDM   1. Back pain   2. Atypical chest pain    Upper thoracic back pain with nausea since this morning it is unchanged but does not radiate. Similar pain the past but never lasted this long. She does a lot of lifting at her job. Chest pain this morning lasting for minutes and resolved. EKG unchanged. Catheterization in June 2014 showed normal coronaries.  Upper extremity blood pressures equal.  Pulses  intact.  Doubt aortic dissection or PE.  No tachycardia or hypoxia.  EKG unchanged and troponin negative. D-dimer negative.  Upper back pain appears musculoskeletal. It is reproducible to palpation. There is no associated weakness, numbness or tingling. Doubt ACS or MI. Doubt aortic dissection or PE.  Pain improved after Norco given the ED. We'll treat with anti-inflammatories and pain medication.  I personally performed the services described in this documentation, which was scribed in my presence. The recorded information has been reviewed and is accurate.   Glynn Octave, MD 07/21/13 2030

## 2013-07-21 NOTE — ED Notes (Signed)
Pt reports woke up this am with pain in upper back and nausea.  Reports went to work and around 9am started having some chest pain.  Pt says pain went away on its own but still having the upper back pain and nausea.

## 2013-08-10 ENCOUNTER — Encounter (HOSPITAL_COMMUNITY): Payer: Self-pay | Admitting: Emergency Medicine

## 2013-08-10 ENCOUNTER — Emergency Department (HOSPITAL_COMMUNITY): Payer: BC Managed Care – PPO

## 2013-08-10 ENCOUNTER — Emergency Department (HOSPITAL_COMMUNITY): Payer: Self-pay

## 2013-08-10 ENCOUNTER — Observation Stay (HOSPITAL_COMMUNITY)
Admission: EM | Admit: 2013-08-10 | Discharge: 2013-08-11 | Disposition: A | Discharge status: Home / Self Care | Payer: Self-pay | Attending: Internal Medicine | Admitting: Internal Medicine

## 2013-08-10 DIAGNOSIS — G8929 Other chronic pain: Secondary | ICD-10-CM

## 2013-08-10 DIAGNOSIS — Z7982 Long term (current) use of aspirin: Secondary | ICD-10-CM | POA: Insufficient documentation

## 2013-08-10 DIAGNOSIS — Z9889 Other specified postprocedural states: Secondary | ICD-10-CM | POA: Insufficient documentation

## 2013-08-10 DIAGNOSIS — R4182 Altered mental status, unspecified: Secondary | ICD-10-CM | POA: Diagnosis present

## 2013-08-10 DIAGNOSIS — R519 Headache, unspecified: Secondary | ICD-10-CM | POA: Diagnosis present

## 2013-08-10 DIAGNOSIS — R2681 Unsteadiness on feet: Secondary | ICD-10-CM

## 2013-08-10 DIAGNOSIS — R112 Nausea with vomiting, unspecified: Secondary | ICD-10-CM | POA: Insufficient documentation

## 2013-08-10 DIAGNOSIS — E785 Hyperlipidemia, unspecified: Secondary | ICD-10-CM | POA: Diagnosis present

## 2013-08-10 DIAGNOSIS — D649 Anemia, unspecified: Secondary | ICD-10-CM | POA: Diagnosis present

## 2013-08-10 DIAGNOSIS — E860 Dehydration: Principal | ICD-10-CM | POA: Insufficient documentation

## 2013-08-10 DIAGNOSIS — G9340 Encephalopathy, unspecified: Secondary | ICD-10-CM | POA: Diagnosis present

## 2013-08-10 DIAGNOSIS — E782 Mixed hyperlipidemia: Secondary | ICD-10-CM | POA: Diagnosis present

## 2013-08-10 DIAGNOSIS — E876 Hypokalemia: Secondary | ICD-10-CM | POA: Diagnosis present

## 2013-08-10 DIAGNOSIS — Z87891 Personal history of nicotine dependence: Secondary | ICD-10-CM | POA: Insufficient documentation

## 2013-08-10 DIAGNOSIS — R630 Anorexia: Secondary | ICD-10-CM | POA: Insufficient documentation

## 2013-08-10 DIAGNOSIS — I10 Essential (primary) hypertension: Secondary | ICD-10-CM | POA: Diagnosis present

## 2013-08-10 DIAGNOSIS — R079 Chest pain, unspecified: Secondary | ICD-10-CM

## 2013-08-10 DIAGNOSIS — R42 Dizziness and giddiness: Secondary | ICD-10-CM | POA: Insufficient documentation

## 2013-08-10 DIAGNOSIS — R000 Tachycardia, unspecified: Secondary | ICD-10-CM | POA: Insufficient documentation

## 2013-08-10 DIAGNOSIS — Z3202 Encounter for pregnancy test, result negative: Secondary | ICD-10-CM | POA: Insufficient documentation

## 2013-08-10 DIAGNOSIS — E7800 Pure hypercholesterolemia, unspecified: Secondary | ICD-10-CM | POA: Diagnosis present

## 2013-08-10 DIAGNOSIS — R51 Headache: Secondary | ICD-10-CM | POA: Insufficient documentation

## 2013-08-10 HISTORY — DX: Headache: R51

## 2013-08-10 LAB — CBC
MCH: 28.3 pg (ref 26.0–34.0)
MCHC: 32.9 g/dL (ref 30.0–36.0)
Platelets: 289 10*3/uL (ref 150–400)
RDW: 15.4 % (ref 11.5–15.5)

## 2013-08-10 LAB — POCT I-STAT, CHEM 8
Creatinine, Ser: 0.9 mg/dL (ref 0.50–1.10)
Glucose, Bld: 193 mg/dL — ABNORMAL HIGH (ref 70–99)
HCT: 38 % (ref 36.0–46.0)
Hemoglobin: 12.9 g/dL (ref 12.0–15.0)
Potassium: 3.3 mEq/L — ABNORMAL LOW (ref 3.5–5.1)
Sodium: 144 mEq/L (ref 135–145)
TCO2: 26 mmol/L (ref 0–100)

## 2013-08-10 LAB — ACETAMINOPHEN LEVEL: Acetaminophen (Tylenol), Serum: <15 ug/mL (ref 10–30)

## 2013-08-10 LAB — TROPONIN I: Troponin I: <0.3 ng/mL (ref <0.30)

## 2013-08-10 LAB — DIFFERENTIAL
Basophils Absolute: 0 10*3/uL (ref 0.0–0.1)
Basophils Relative: 0 % (ref 0–1)
Eosinophils Absolute: 0 10*3/uL (ref 0.0–0.7)
Monocytes Relative: 5 % (ref 3–12)
Neutrophils Relative %: 84 % — ABNORMAL HIGH (ref 43–77)

## 2013-08-10 LAB — COMPREHENSIVE METABOLIC PANEL
ALT: 11 U/L (ref 0–35)
AST: 14 U/L (ref 0–37)
Albumin: 4.2 g/dL (ref 3.5–5.2)
Alkaline Phosphatase: 104 U/L (ref 39–117)
BUN: 16 mg/dL (ref 6–23)
Calcium: 9.6 mg/dL (ref 8.4–10.5)
Chloride: 103 mEq/L (ref 96–112)
GFR calc Af Amer: >90 mL/min (ref >90)
Potassium: 3.3 mEq/L — ABNORMAL LOW (ref 3.5–5.1)
Sodium: 144 mEq/L (ref 135–145)
Total Protein: 7.8 g/dL (ref 6.0–8.3)

## 2013-08-10 LAB — RAPID URINE DRUG SCREEN, HOSP PERFORMED
Amphetamines: NOT DETECTED
Benzodiazepines: NOT DETECTED
Cocaine: NOT DETECTED
Tetrahydrocannabinol: NOT DETECTED

## 2013-08-10 LAB — URINE MICROSCOPIC-ADD ON

## 2013-08-10 LAB — PROTIME-INR
INR: 1.04 (ref 0.00–1.49)
Prothrombin Time: 13.4 seconds (ref 11.6–15.2)

## 2013-08-10 LAB — URINALYSIS, ROUTINE W REFLEX MICROSCOPIC
Bilirubin Urine: NEGATIVE
Glucose, UA: 100 mg/dL — AB
Ketones, ur: NEGATIVE mg/dL
Protein, ur: NEGATIVE mg/dL
pH: 5.5 (ref 5.0–8.0)

## 2013-08-10 LAB — ETHANOL: Alcohol, Ethyl (B): <11 mg/dL (ref 0–11)

## 2013-08-10 LAB — GLUCOSE, CAPILLARY: Glucose-Capillary: 144 mg/dL — ABNORMAL HIGH (ref 70–99)

## 2013-08-10 LAB — POCT I-STAT TROPONIN I

## 2013-08-10 MED ORDER — SODIUM CHLORIDE 0.9 % IJ SOLN: 3.0000 mL | Freq: Two times a day (BID) | INTRAMUSCULAR | Status: DC

## 2013-08-10 MED ORDER — ALTEPLASE (STROKE) FULL DOSE INFUSION
75.0000 mg | Freq: Once | INTRAVENOUS | Status: DC
  Filled 2013-08-10: qty 75

## 2013-08-10 MED ORDER — KETOROLAC TROMETHAMINE 30 MG/ML IJ SOLN: 30.0000 mg | Freq: Once | INTRAMUSCULAR | Status: DC

## 2013-08-10 MED ORDER — ACETAMINOPHEN 650 MG RE SUPP: 650.0000 mg | Freq: Four times a day (QID) | RECTAL | Status: DC | PRN

## 2013-08-10 MED ORDER — ACETAMINOPHEN 325 MG PO TABS: 650.0000 mg | ORAL_TABLET | Freq: Four times a day (QID) | ORAL | Status: DC | PRN

## 2013-08-10 MED ORDER — ONDANSETRON HCL 4 MG/2ML IJ SOLN: 4.0000 mg | Freq: Four times a day (QID) | INTRAMUSCULAR | Status: DC | PRN

## 2013-08-10 MED ORDER — OXYCODONE HCL 5 MG PO TABS: 5.0000 mg | ORAL_TABLET | ORAL | Status: DC | PRN

## 2013-08-10 MED ORDER — SODIUM CHLORIDE 0.9 % IV SOLN
INTRAVENOUS | Status: DC
  Administered 2013-08-10: 18:00:00 via INTRAVENOUS

## 2013-08-10 MED ORDER — ENOXAPARIN SODIUM 40 MG/0.4ML ~~LOC~~ SOLN
40.0000 mg | SUBCUTANEOUS | Status: DC
  Administered 2013-08-10: 40 mg via SUBCUTANEOUS
  Filled 2013-08-10 (×2): qty 0.4

## 2013-08-10 MED ORDER — NAPROXEN 500 MG PO TABS
500.0000 mg | ORAL_TABLET | Freq: Two times a day (BID) | ORAL | Status: DC | PRN
  Filled 2013-08-10: qty 1

## 2013-08-10 MED ORDER — GADOBENATE DIMEGLUMINE 529 MG/ML IV SOLN
18.0000 mL | Freq: Once | INTRAVENOUS | Status: AC | PRN
  Administered 2013-08-10: 18 mL via INTRAVENOUS

## 2013-08-10 MED ORDER — ALBUTEROL SULFATE (5 MG/ML) 0.5% IN NEBU: 2.5000 mg | INHALATION_SOLUTION | RESPIRATORY_TRACT | Status: DC | PRN

## 2013-08-10 MED ORDER — SODIUM CHLORIDE 0.9 % IV BOLUS (SEPSIS)
1000.0000 mL | Freq: Once | INTRAVENOUS | Status: AC
  Administered 2013-08-10: 1000 mL via INTRAVENOUS

## 2013-08-10 MED ORDER — ONDANSETRON HCL 4 MG PO TABS: 4.0000 mg | ORAL_TABLET | Freq: Four times a day (QID) | ORAL | Status: DC | PRN

## 2013-08-10 MED ORDER — METOCLOPRAMIDE HCL 5 MG/ML IJ SOLN: 5.0000 mg | Freq: Once | INTRAMUSCULAR | Status: DC

## 2013-08-10 MED ORDER — HYDROMORPHONE HCL PF 1 MG/ML IJ SOLN: 0.5000 mg | INTRAMUSCULAR | Status: DC | PRN

## 2013-08-10 MED ORDER — HYDRALAZINE HCL 20 MG/ML IJ SOLN: 10.0000 mg | Freq: Four times a day (QID) | INTRAMUSCULAR | Status: DC | PRN

## 2013-08-10 MED ORDER — POTASSIUM CHLORIDE IN NACL 20-0.45 MEQ/L-% IV SOLN
INTRAVENOUS | Status: AC
  Administered 2013-08-10: 23:00:00 via INTRAVENOUS
  Filled 2013-08-10 (×2): qty 1000

## 2013-08-10 MED ORDER — POTASSIUM CHLORIDE CRYS ER 20 MEQ PO TBCR: 40.0000 meq | EXTENDED_RELEASE_TABLET | Freq: Once | ORAL | Status: DC

## 2013-08-10 MED ORDER — ASPIRIN 81 MG PO CHEW
324.0000 mg | CHEWABLE_TABLET | Freq: Once | ORAL | Status: AC
  Administered 2013-08-10: 324 mg via ORAL
  Filled 2013-08-10: qty 4

## 2013-08-10 MED ORDER — ASPIRIN EC 81 MG PO TBEC
81.0000 mg | DELAYED_RELEASE_TABLET | Freq: Every day | ORAL | Status: DC
  Administered 2013-08-11: 81 mg via ORAL
  Filled 2013-08-10: qty 1

## 2013-08-10 NOTE — Code Documentation (Signed)
52yo female arriving to [facility] via [facility].  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  She was seen to be at her baseline at 0830 this morning.  Her boyfriend called her and felt her speech was slurred.  He called her coworkers to check on her and they found her at 1130 to be less responsive with her shirt on backwards.  EMS reports that she is arousable and will follow commands.  NIHSS 4 on arrival for drowsiness, ataxia, and dysarthria.  Patient reports that she took Tylenol PM last night.  History of HTN, not currently taking any medications.  Patient taken to MRI; MRI negative for acute infarct per Dr. Amada Jupiter.  Code stroke canceled at 1253.  No acute stroke intervention at this time.  Bedside handoff with ED RN Drinda Butts.

## 2013-08-10 NOTE — ED Notes (Signed)
Pt is a Financial controller for [employer]. Was found slumped in a chair this morning and "not acting right". ems called

## 2013-08-10 NOTE — ED Notes (Signed)
Patient has gone to mri

## 2013-08-10 NOTE — ED Notes (Signed)
Assisted pt to bsc to collect urine. Noticed pt tachycardic in 120s. Pt reports she had been sick from Friday to Sunday with n/v/d .

## 2013-08-10 NOTE — Consult Note (Signed)
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 She was last seen normal at 08:30 and then found at 11:30.  Boyfriend called her and stated she was slurring her speech.  At 11:30 patient was found very drowsy, difficult to follow commands and shirt was on backwards. Patient was brought to ED where initial CT head was negative.  Due to continued suspicion of possible cerebellar CVA patient was brought to MRI. MRI was negative for stroke. In speaking with husbands, patient has migraines in the past but they are usually not presenting like this.   Date last known well: Date: 08/10/2013 Time last known well: Time: 08:30 tPA Given: No: negative MRI NIHSS: 4  Past Medical History  Diagnosis Date  . Environmental allergies   . Back pain   . Chest pain 01/2013    Admitted to [facility] in 01/2013  . Hypertension 01/13/2013    Past Surgical History  Procedure Laterality Date  . Dilation and curettage of uterus    . Cardiac catheterization      Family History  Problem Relation Age of Onset  . Hypertension Father    Social History:  reports that she has quit smoking. She has never used smokeless tobacco. She reports that she does not drink alcohol or use illicit drugs.  Allergies: No Known Allergies  Medications:                                                                                                                           Current Facility-Administered Medications  Medication Dose Route Frequency Provider Last Rate Last Dose  . alteplase (ACTIVASE) 1 mg/mL infusion 75 mg  75 mg Intravenous Once Enid Skeens, MD       Current Outpatient Prescriptions  Medication Sig Dispense Refill  . acetaminophen (TYLENOL) 500 MG tablet Take 1,000 mg by mouth  every 6 (six) hours as needed for pain.      Marland Kitchen aspirin EC 81 MG tablet Take 81 mg by mouth daily.      . diphenhydrAMINE (BENADRYL) 25 MG  tablet Take 50 mg by mouth at bedtime as needed and may repeat dose one time if needed for allergies or sleep. For allergy      . naproxen (NAPROSYN) 500 MG tablet Take 1 tablet (500 mg total) by mouth 2 (two) times daily.  30 tablet  0  . naproxen sodium (ALEVE) 220 MG tablet Take 220-440 mg by mouth daily as needed (for pain).         ROS:                                                                                                                                       History obtained from unobtainable from patient due to mental status   Neurologic Examination:                                                                                                      There were no vitals taken for this visit.  Mental Status:  Oriented, very drowsy, thought content appropriate.  Speech dysarthria without evidence of aphasia.  Able to follow 3 step commands without difficulty. Cranial Nerves: II: Discs flat bilaterally; Visual fields grossly normal, pupils equal, round, reactive to light and accommodation III,IV, VI: ptosis not present, extra-ocular motions intact bilaterally, far gaze nystagmus bilaterally V,VII: smile symmetric, facial light touch sensation normal bilaterally VIII: hearing normal bilaterally IX,X: gag reflex present XI: bilateral shoulder shrug XII: midline tongue extension without atrophy or fasciculations  Motor: 4/5 throughout with no drift Tone and bulk:normal tone throughout; no atrophy noted Sensory: Pinprick and light touch intact throughout, bilaterally Deep Tendon Reflexes:  Right: Upper Extremity   Left: Upper extremity   biceps (C-5 to C-6) 2/4   biceps (C-5 to C-6) 2/4 tricep (C7) 2/4    triceps (C7) 2/4 Brachioradialis (C6) 2/4  Brachioradialis (C6) 2/4  Lower Extremity Lower Extremity  quadriceps (L-2 to L-4) 2/4    quadriceps (L-2 to L-4) 2/4 Achilles (S1) 1/4   Achilles (S1) 1/4  Plantars: Right: downgoing   Left: downgoing Cerebellar: dysmetric finger-to-nose bilaterally (R>L),  normal heel-to-shin test Gait: not assessed 2/2 emergent nature of the evaluation CV: pulses palpable throughout    Results for orders placed during the hospital encounter of 08/10/13 (from the past 48 hour(s))  PROTIME-INR     Status: None   Collection Time    08/10/13 12:00 PM      Result Value Range   Prothrombin Time 13.4  11.6 - 15.2 seconds  INR 1.04  0.00 - 1.49  APTT     Status: None   Collection Time    08/10/13 12:00 PM      Result Value Range   aPTT 24  24 - 37 seconds  CBC     Status: Abnormal   Collection Time    08/10/13 12:00 PM      Result Value Range   WBC 11.7 (*) 4.0 - 10.5 K/uL   RBC 4.03  3.87 - 5.11 MIL/uL   Hemoglobin 11.4 (*) 12.0 - 15.0 g/dL   HCT 40.9 (*) 81.1 - 91.4 %   MCV 85.9  78.0 - 100.0 fL   MCH 28.3  26.0 - 34.0 pg   MCHC 32.9  30.0 - 36.0 g/dL   RDW 78.2  95.6 - 21.3 %   Platelets 289  150 - 400 K/uL  DIFFERENTIAL     Status: Abnormal   Collection Time    08/10/13 12:00 PM      Result Value Range   Neutrophils Relative % 84 (*) 43 - 77 %   Neutro Abs 9.9 (*) 1.7 - 7.7 K/uL   Lymphocytes Relative 10 (*) 12 - 46 %   Lymphs Abs 1.2  0.7 - 4.0 K/uL   Monocytes Relative 5  3 - 12 %   Monocytes Absolute 0.6  0.1 - 1.0 K/uL   Eosinophils Relative 0  0 - 5 %   Eosinophils Absolute 0.0  0.0 - 0.7 K/uL   Basophils Relative 0  0 - 1 %   Basophils Absolute 0.0  0.0 - 0.1 K/uL  POCT I-STAT TROPONIN I     Status: None   Collection Time    08/10/13 12:06 PM      Result Value Range   Troponin i, poc 0.00  0.00 - 0.08 ng/mL   Comment 3            Comment: Due to the release kinetics of cTnI,     a negative result within the first hours     of the onset of symptoms does not rule out     myocardial infarction with certainty.     If myocardial infarction is still suspected,      repeat the test at appropriate intervals.  POCT I-STAT, CHEM 8     Status: Abnormal   Collection Time    08/10/13 12:08 PM      Result Value Range   Sodium 144  135 - 145 mEq/L   Potassium 3.3 (*) 3.5 - 5.1 mEq/L   Chloride 103  96 - 112 mEq/L   BUN 16  6 - 23 mg/dL   Creatinine, Ser 0.86  0.50 - 1.10 mg/dL   Glucose, Bld 578 (*) 70 - 99 mg/dL   Calcium, Ion 4.69  6.29 - 1.23 mmol/L   TCO2 26  0 - 100 mmol/L   Hemoglobin 12.9  12.0 - 15.0 g/dL   HCT 52.8  41.3 - 24.4 %   No results found.  Assessment and plan discussed with with attending physician and they are in agreement.    Felicie Morn PA-C [facility] [phone]  08/10/2013, 12:26 PM   Stroke Risk Factors - hypertension  Assessment: 52 y.o. female presenting with AMS, end gaze nystagmus, dysarthria and decreased mental status. MRI brain was negative for stroke. Given the non-focal Differential would include ingestion(patient did take tylenol-PM last night), complicated migraine, cereebllitis.   1) Would treat for migraine, could use toradol, if  symptoms improve no further workup needed.  2) If symptoms persists, would pursue MRI w/wo contrast(only had diffusion images on first study) 3) further recommendations pending these interventions.   Ritta Slot, MD [facility] [phone]  If 7pm- 7am, please page neurology on call at [phone].

## 2013-08-10 NOTE — ED Provider Notes (Addendum)
CSN: [id number]     Arrival date & time 08/10/13  1156 History   First MD Initiated Contact with Patient 08/10/13 1205     Chief Complaint  Patient presents with  . Code Stroke   (Consider location/radiation/quality/duration/timing/severity/associated sxs/prior Treatment) HPI Comments: 52 yo female with htn, cholesterol hx presents with EMS as code stroke.  Pt was found slumped in a chair this am not acting right, general lethargy.  No hx of similar.  No new medicines.  Pt had brief migraine like HA.  Recent n/v/ diarrhea.  Nothing improved sxs, pt does not recall details but felt generally weak/ lightheaded this am.  Denies narcotics.  No current ha. No stroke. Hx.  The history is provided by the patient.    Past Medical History  Diagnosis Date  . Environmental allergies   . Back pain   . Chest pain 01/2013    Admitted to [facility] in 01/2013  . Hypertension 01/13/2013   Past Surgical History  Procedure Laterality Date  . Dilation and curettage of uterus    . Cardiac catheterization     Family History  Problem Relation Age of Onset  . Hypertension Father    History  Substance Use Topics  . Smoking status: Former Games developer  . Smokeless tobacco: Never Used  . Alcohol Use: No   OB History   Grav Para Term Preterm Abortions TAB SAB Ect Mult Living                 Review of Systems  Constitutional: Positive for appetite change and fatigue. Negative for fever and chills.  HENT: Negative for congestion.   Eyes: Negative for visual disturbance.  Respiratory: Negative for shortness of breath.   Cardiovascular: Negative for chest pain.  Gastrointestinal: Positive for nausea and vomiting. Negative for abdominal pain.  Genitourinary: Negative for dysuria and flank pain.  Musculoskeletal: Negative for back pain, neck pain and neck stiffness.  Skin: Negative for rash.  Neurological: Positive for weakness, light-headedness and headaches.    Allergies  Review of patient's allergies  indicates no known allergies.  Home Medications   Current Outpatient Rx  Name  Route  Sig  Dispense  Refill  . acetaminophen (TYLENOL) 500 MG tablet   Oral   Take 1,000 mg by mouth every 6 (six) hours as needed for pain.         Marland Kitchen aspirin EC 81 MG tablet   Oral   Take 81 mg by mouth daily.         . diphenhydrAMINE (BENADRYL) 25 MG tablet   Oral   Take 50 mg by mouth at bedtime as needed and may repeat dose one time if needed for allergies or sleep. For allergy         . naproxen (NAPROSYN) 500 MG tablet   Oral   Take 1 tablet (500 mg total) by mouth 2 (two) times daily.   30 tablet   0   . naproxen sodium (ALEVE) 220 MG tablet   Oral   Take 220-440 mg by mouth daily as needed (for pain).          BP 141/87  Pulse 104  Temp(Src) 98.4 F (36.9 C) (Oral)  Resp 20  SpO2 99% Physical Exam  Nursing note and vitals reviewed. Constitutional: She is oriented to person, place, and time. She appears well-developed and well-nourished.  HENT:  Head: Normocephalic and atraumatic.  Dry mm  Eyes: Conjunctivae are normal. Right eye exhibits no discharge. Left  eye exhibits no discharge.  Neck: Normal range of motion. Neck supple. No tracheal deviation present.  Cardiovascular: Regular rhythm.  Tachycardia present.   Pulmonary/Chest: Effort normal and breath sounds normal.  Abdominal: Soft. She exhibits no distension. There is no tenderness. There is no guarding.  Musculoskeletal: She exhibits no edema.  Neurological: She is alert and oriented to person, place, and time. GCS eye subscore is 4. GCS verbal subscore is 5. GCS motor subscore is 6.  General lethargy on arrival, moved all ext equal with mild weakness Recheck.  5+ strength in UE and LE with f/e at major joints. Sensation to palpation intact in UE and LE. CNs 2-12 grossly intact.  EOMFI.  PERRL.   Finger nose and coordination intact bilateral.   Visual fields intact to finger testing.   Skin: Skin is warm. No  rash noted.  Psychiatric: She has a normal mood and affect.    ED Course  Procedures (including critical care time) Labs Review Labs Reviewed  CBC - Abnormal; Notable for the following:    WBC 11.7 (*)    Hemoglobin 11.4 (*)    HCT 34.6 (*)    All other components within normal limits  DIFFERENTIAL - Abnormal; Notable for the following:    Neutrophils Relative % 84 (*)    Neutro Abs 9.9 (*)    Lymphocytes Relative 10 (*)    All other components within normal limits  COMPREHENSIVE METABOLIC PANEL - Abnormal; Notable for the following:    Potassium 3.3 (*)    Glucose, Bld 187 (*)    All other components within normal limits  URINALYSIS, ROUTINE W REFLEX MICROSCOPIC - Abnormal; Notable for the following:    Glucose, UA 100 (*)    Leukocytes, UA SMALL (*)    All other components within normal limits  GLUCOSE, CAPILLARY - Abnormal; Notable for the following:    Glucose-Capillary 144 (*)    All other components within normal limits  URINE MICROSCOPIC-ADD ON - Abnormal; Notable for the following:    Squamous Epithelial / LPF FEW (*)    Bacteria, UA FEW (*)    Casts HYALINE CASTS (*)    All other components within normal limits  POCT I-STAT, CHEM 8 - Abnormal; Notable for the following:    Potassium 3.3 (*)    Glucose, Bld 193 (*)    All other components within normal limits  URINE CULTURE  ETHANOL  PROTIME-INR  APTT  TROPONIN I  URINE RAPID DRUG SCREEN (HOSP PERFORMED)  URINE RAPID DRUG SCREEN (HOSP PERFORMED)  PHENOTHIAZINE SCREEN, URINE  POCT I-STAT TROPONIN I   Imaging Review Ct Head Wo Contrast  08/10/2013   CLINICAL DATA:  Patient not oriented last seen normal at 8:30 a.m.  EXAM: CT HEAD WITHOUT CONTRAST  TECHNIQUE: Contiguous axial images were obtained from the base of the skull through the vertex without intravenous contrast.  COMPARISON:  None.  FINDINGS: There is no evidence of mass effect, midline shift or extra-axial fluid collections. There is no evidence of a  space-occupying lesion or intracranial hemorrhage. There is no evidence of a cortical-based area of acute infarction.  The ventricles and sulci are appropriate for the patient's age. The basal cisterns are patent.  Visualized portions of the orbits are unremarkable. The visualized portions of the paranasal sinuses and mastoid air cells are unremarkable.  The osseous structures are unremarkable.  IMPRESSION: No acute intracranial pathology. These results were called by telephone at the time of interpretation on 08/10/2013 at  12:26 PM to Dr. Amada Jupiter, who verbally acknowledged these results.   Electronically Signed   By: Elige Ko   On: 08/10/2013 12:27   Mr Brain Wo Contrast  08/10/2013   CLINICAL DATA:  Code stroke  EXAM: MRI HEAD WITHOUT CONTRAST  TECHNIQUE: Multiplanar, multiecho pulse sequences of the brain and surrounding structures were obtained without intravenous contrast.  COMPARISON:  CT 08/10/2013  FINDINGS: Axial and coronal diffusion-weighted imaging only were obtained. No other sequences of the brain were obtained.  On coronal weighted imaging, there is a 6mm hyperintensity in the left cerebellar tonsil. This is not identified on the axial view and may be artifact related to adjacent skullbase. Small acute infarct less likely. No other areas of restricted diffusion are present. Ventricle size is normal.  IMPRESSION: Questionable area of restricted diffusion in the left cerebellar tonsil which is most likely artifact. This measures approximately 6 mm. No other areas of restricted diffusion.   Electronically Signed   By: Marlan Palau M.D.   On: 08/10/2013 12:59    EKG Interpretation    Date/Time:  Tuesday August 10 2013 12:54:03 EST Ventricular Rate:  102 PR Interval:  166 QRS Duration: 114 QT Interval:  359 QTC Calculation: 468 R Axis:   13 Text Interpretation:  Sinus tachycardia Borderline intraventricular conduction delay Abnormal R-wave progression, early transition Confirmed  by Tylasia Fletchall  MD, Alisabeth Selkirk ([id number]) on 08/10/2013 4:20:43 PM            MDM   1. Unsteady gait    Concern for stroke vs metabolic vs meds. General lethargy on arrival, responded to loud verbal and pain, non focal. Examined in ambulance bay with neuro.  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 Tachy and dry mm-  Fluid bolus given.   Pt improved on recheck, hr improved.  Neuro exam improved on recheck, non focal.  Neuro reordered MRI with contrast.   Mild fatigue appearance on recheck.  Awaiting final neuro recs.   Signed out to fup MRI results and likely either admit/ observation. Spoke with Dr Sherre Lain for admission to tele. Accepted  Dehydration, Sinus tachycardia, AMS, HTN, unsteady gait   Enid Skeens, MD 08/10/13 1621  Enid Skeens, MD 08/10/13 [id number]

## 2013-08-10 NOTE — H&P (Signed)
[facility]  History and Physical  JAMAICA INTHAVONG AVW:[id number] DOB: 09/30/1960 DOA: 08/10/2013  Referring physician: EDP PCP: No PCP Per Patient  Outpatient Specialists:  1. None  Chief Complaint: Altered Mental State.  HPI: Kendra Valenzuela is a 52 y.o. female with history of hypertension, hyperlipidemia, chronic headaches, medication noncompliance for 2 months, presented to the ED with altered mental status. History is provided by patient's boyfriend who is at bedside and by the patient. 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 According to boyfriend, she was last heard speaking normally on the telephone at 8:30 PM on 08/09/13 at which time she complained of a headache and stated that she was going to sleep early. On 08/10/13, when the boyfriend tried to reach her on the phone at approximately 8:30 AM, he was unable to get her. He states that he went down to the place that she works and alerted and the staff. He then found her sitting slumped on a chair, drowsy, slurred speech and her shirt worn backwards. Patient was brought to the ED as a code stroke. Neurology evaluated the patient. CT head was negative. As per neurology, initial MRI head also negative for stroke. Patient was tachycardic and received IV fluid bolus with improvement. Since then, mental status was significantly improved. Patient denies any headaches currently. She states that she remembers waking up at 1:15 AM to let an inmate in and then doesn't remember anything. She gives history of 2-3 times per week of frontal headache, unclear quality, nonradiating, precipitated by stress and hunger with no clear relieving factors. No history of photophobia. Patient denies overdosing on any medications or doing drugs. Hospitalist admission requested for further evaluation.   Review of Systems: All systems reviewed and apart from history of presenting illness, are negative.  Past Medical History  Diagnosis Date   . Environmental allergies   . Back pain   . Chest pain 01/2013    Admitted to [facility] in 01/2013  . Hypertension 01/13/2013   Past Surgical History  Procedure Laterality Date  . Dilation and curettage of uterus    . Cardiac catheterization     Social History:  reports that she has quit smoking. She has never used smokeless tobacco. She reports that she does not drink alcohol or use illicit drugs. Independent of activities of daily living. Works as a Lawyer at the assisted living facility in [place].  No Known Allergies  Family History  Problem Relation Age of Onset  . Hypertension Father     Prior to Admission medications   Medication Sig Start Date End Date Taking? Authorizing Provider  acetaminophen (TYLENOL) 500 MG tablet Take 1,000 mg by mouth every 6 (six) hours as needed for pain.   Yes Historical Provider, MD  aspirin EC 81 MG tablet Take 81 mg by mouth daily.   Yes Historical Provider, MD  diphenhydrAMINE (BENADRYL) 25 MG tablet Take 50 mg by mouth at bedtime as needed and may repeat dose one time if needed for allergies or sleep. For allergy   Yes Historical Provider, MD  Multiple Vitamin (MULTIVITAMIN WITH MINERALS) TABS tablet Take 1 tablet by mouth daily.   Yes Historical Provider, MD  naproxen (NAPROSYN) 500 MG tablet Take 1 tablet (500 mg total) by mouth 2 (two) times daily. 07/21/13  Yes Glynn Octave, MD  naproxen sodium (ALEVE) 220 MG tablet Take 220-440 mg by mouth daily as needed (for pain).   Yes Historical Provider, MD   Physical Exam:  Filed Vitals:   08/10/13 1401 08/10/13 1500 08/10/13 1622 08/10/13 1745  BP: 141/87 167/92 151/89 154/93  Pulse: 104 107    Temp: 98.4 F (36.9 C)     TempSrc: Oral     Resp: 20 19 18 17   SpO2: 99% 98% 98% 100%     General exam: Moderately built and nourished female patient, lying comfortably supine on the gurney in no obvious distress.  Head, eyes and ENT: Nontraumatic and normocephalic. Pupils equally reacting to light  and accommodation. Oral mucosa dry.  Neck: Supple. No JVD, carotid bruit or thyromegaly.  Lymphatics: No lymphadenopathy.  Respiratory system: Clear to auscultation. No increased work of breathing.  Cardiovascular system: S1 and S2 heard, RRR. No JVD, murmurs, gallops, clicks or pedal edema.  Gastrointestinal system: Abdomen is nondistended, soft and nontender. Normal bowel sounds heard. No organomegaly or masses appreciated.  Central nervous system: Alert and oriented x4. No focal neurological deficits. Patient seems to have mild slurred speech which may be due to dry mouth. No facial asymmetry.  Extremities: Symmetric 5 x 5 power. Peripheral pulses symmetrically felt.   Skin: No rashes or acute findings.  Musculoskeletal system: Negative exam.  Psychiatry: Pleasant and cooperative.   Labs on Admission:  Basic Metabolic Panel:  Recent Labs Lab 08/10/13 1200 08/10/13 1208  NA 144 144  K 3.3* 3.3*  CL 103 103  CO2 26  --   GLUCOSE 187* 193*  BUN 16 16  CREATININE 0.62 0.90  CALCIUM 9.6  --    Liver Function Tests:  Recent Labs Lab 08/10/13 1200  AST 14  ALT 11  ALKPHOS 104  BILITOT 0.3  PROT 7.8  ALBUMIN 4.2   No results found for this basename: LIPASE, AMYLASE,  in the last 168 hours No results found for this basename: AMMONIA,  in the last 168 hours CBC:  Recent Labs Lab 08/10/13 1200 08/10/13 1208  WBC 11.7*  --   NEUTROABS 9.9*  --   HGB 11.4* 12.9  HCT 34.6* 38.0  MCV 85.9  --   PLT 289  --    Cardiac Enzymes:  Recent Labs Lab 08/10/13 1200  TROPONINI <0.30    BNP (last 3 results)  Recent Labs  01/13/13 0745 07/21/13 1645  PROBNP 44.6 28.2   CBG:  Recent Labs Lab 08/10/13 1312  GLUCAP 144*    Radiological Exams on Admission: Ct Head Wo Contrast  08/10/2013   CLINICAL DATA:  Patient not oriented last seen normal at 8:30 a.m.  EXAM: CT HEAD WITHOUT CONTRAST  TECHNIQUE: Contiguous axial images were obtained from the base of  the skull through the vertex without intravenous contrast.  COMPARISON:  None.  FINDINGS: There is no evidence of mass effect, midline shift or extra-axial fluid collections. There is no evidence of a space-occupying lesion or intracranial hemorrhage. There is no evidence of a cortical-based area of acute infarction.  The ventricles and sulci are appropriate for the patient's age. The basal cisterns are patent.  Visualized portions of the orbits are unremarkable. The visualized portions of the paranasal sinuses and mastoid air cells are unremarkable.  The osseous structures are unremarkable.  IMPRESSION: No acute intracranial pathology. These results were called by telephone at the time of interpretation on 08/10/2013 at 12:26 PM to Dr. Amada Jupiter, who verbally acknowledged these results.   Electronically Signed   By: Elige Ko   On: 08/10/2013 12:27   Mr Maxine Glenn Head Wo Contrast  08/10/2013   CLINICAL DATA:  Cerebellar symptoms. Rule out encephalitis or stroke.  EXAM: MRI HEAD WITHOUT AND WITH CONTRAST  MRA HEAD WITHOUT CONTRAST  MRA NECK WITHOUT AND WITH CONTRAST  TECHNIQUE: Multiplanar, multiecho pulse sequences of the brain and surrounding structures were obtained without and with intravenous contrast. Angiographic images of the Circle of Willis were obtained using MRA technique without intravenous contrast. Angiographic images of the neck were obtained using MRA technique without and with intravenous contrast. Carotid stenosis measurements (when applicable) are obtained utilizing NASCET criteria, using the distal internal carotid diameter as the denominator.  CONTRAST:  18mL MULTIHANCE GADOBENATE DIMEGLUMINE 529 MG/ML IV SOLN  COMPARISON:  MRI brain limited 08/10/2013, CT head 08/10/2013  FINDINGS: MRI HEAD FINDINGS  Ventricle size is normal. Pituitary normal in size. Craniocervical junction is normal.  Small hyperintensity in the right frontal operculum has a chronic appearance. No other white matter  lesions. Brainstem and cerebellum appear normal.  Diffusion-weighted imaging is negative. The questioned abnormality in the left cerebellar tonsil on the prior diffusion-weighted imaging no longer visualized.  Negative for hemorrhage or fluid collection.  No mass or edema.  Postcontrast imaging reveals normal enhancement. Normal enhancement of the cerebellum.  MRA HEAD FINDINGS  Both vertebral arteries are widely patent. Posterior inferior cerebellar artery is patent bilaterally. Basilar artery is widely patent. Superior cerebellar and posterior cerebral arteries are patent without stenosis.  Internal carotid artery widely patent bilaterally. Anterior and middle cerebral arteries are patent bilaterally without stenosis  Negative for cerebral aneurysm.  MRA NECK FINDINGS  Two vessel arch. Kinking of the proximal left common carotid artery.  The carotid bifurcation is widely patent bilaterally without significant stenosis.  Both vertebral arteries are patent with anterograde flow and no significant stenosis. Negative for vertebral stenosis or dissection.  IMPRESSION: No acute intracranial abnormality. No acute infarct or mass. Normal enhancement. No evidence of cerebellar abnormality.  Negative MRA of the circle Willis.  Negative MRA of the neck.   Electronically Signed   By: Marlan Palau M.D.   On: 08/10/2013 17:26   Mr Angiogram Neck W Wo Contrast  08/10/2013   CLINICAL DATA:  Cerebellar symptoms. Rule out encephalitis or stroke.  EXAM: MRI HEAD WITHOUT AND WITH CONTRAST  MRA HEAD WITHOUT CONTRAST  MRA NECK WITHOUT AND WITH CONTRAST  TECHNIQUE: Multiplanar, multiecho pulse sequences of the brain and surrounding structures were obtained without and with intravenous contrast. Angiographic images of the Circle of Willis were obtained using MRA technique without intravenous contrast. Angiographic images of the neck were obtained using MRA technique without and with intravenous contrast. Carotid stenosis  measurements (when applicable) are obtained utilizing NASCET criteria, using the distal internal carotid diameter as the denominator.  CONTRAST:  18mL MULTIHANCE GADOBENATE DIMEGLUMINE 529 MG/ML IV SOLN  COMPARISON:  MRI brain limited 08/10/2013, CT head 08/10/2013  FINDINGS: MRI HEAD FINDINGS  Ventricle size is normal. Pituitary normal in size. Craniocervical junction is normal.  Small hyperintensity in the right frontal operculum has a chronic appearance. No other white matter lesions. Brainstem and cerebellum appear normal.  Diffusion-weighted imaging is negative. The questioned abnormality in the left cerebellar tonsil on the prior diffusion-weighted imaging no longer visualized.  Negative for hemorrhage or fluid collection.  No mass or edema.  Postcontrast imaging reveals normal enhancement. Normal enhancement of the cerebellum.  MRA HEAD FINDINGS  Both vertebral arteries are widely patent. Posterior inferior cerebellar artery is patent bilaterally. Basilar artery is widely patent. Superior cerebellar and posterior cerebral arteries are patent without stenosis.  Internal  carotid artery widely patent bilaterally. Anterior and middle cerebral arteries are patent bilaterally without stenosis  Negative for cerebral aneurysm.  MRA NECK FINDINGS  Two vessel arch. Kinking of the proximal left common carotid artery.  The carotid bifurcation is widely patent bilaterally without significant stenosis.  Both vertebral arteries are patent with anterograde flow and no significant stenosis. Negative for vertebral stenosis or dissection.  IMPRESSION: No acute intracranial abnormality. No acute infarct or mass. Normal enhancement. No evidence of cerebellar abnormality.  Negative MRA of the circle Willis.  Negative MRA of the neck.   Electronically Signed   By: Marlan Palau M.D.   On: 08/10/2013 17:26   Mr Brain Wo Contrast  08/10/2013   CLINICAL DATA:  Code stroke  EXAM: MRI HEAD WITHOUT CONTRAST  TECHNIQUE: Multiplanar,  multiecho pulse sequences of the brain and surrounding structures were obtained without intravenous contrast.  COMPARISON:  CT 08/10/2013  FINDINGS: Axial and coronal diffusion-weighted imaging only were obtained. No other sequences of the brain were obtained.  On coronal weighted imaging, there is a 6mm hyperintensity in the left cerebellar tonsil. This is not identified on the axial view and may be artifact related to adjacent skullbase. Small acute infarct less likely. No other areas of restricted diffusion are present. Ventricle size is normal.  IMPRESSION: Questionable area of restricted diffusion in the left cerebellar tonsil which is most likely artifact. This measures approximately 6 mm. No other areas of restricted diffusion.   Electronically Signed   By: Marlan Palau M.D.   On: 08/10/2013 12:59   Mr Laqueta Jean JY Contrast  08/10/2013   CLINICAL DATA:  Cerebellar symptoms. Rule out encephalitis or stroke.  EXAM: MRI HEAD WITHOUT AND WITH CONTRAST  MRA HEAD WITHOUT CONTRAST  MRA NECK WITHOUT AND WITH CONTRAST  TECHNIQUE: Multiplanar, multiecho pulse sequences of the brain and surrounding structures were obtained without and with intravenous contrast. Angiographic images of the Circle of Willis were obtained using MRA technique without intravenous contrast. Angiographic images of the neck were obtained using MRA technique without and with intravenous contrast. Carotid stenosis measurements (when applicable) are obtained utilizing NASCET criteria, using the distal internal carotid diameter as the denominator.  CONTRAST:  18mL MULTIHANCE GADOBENATE DIMEGLUMINE 529 MG/ML IV SOLN  COMPARISON:  MRI brain limited 08/10/2013, CT head 08/10/2013  FINDINGS: MRI HEAD FINDINGS  Ventricle size is normal. Pituitary normal in size. Craniocervical junction is normal.  Small hyperintensity in the right frontal operculum has a chronic appearance. No other white matter lesions. Brainstem and cerebellum appear normal.   Diffusion-weighted imaging is negative. The questioned abnormality in the left cerebellar tonsil on the prior diffusion-weighted imaging no longer visualized.  Negative for hemorrhage or fluid collection.  No mass or edema.  Postcontrast imaging reveals normal enhancement. Normal enhancement of the cerebellum.  MRA HEAD FINDINGS  Both vertebral arteries are widely patent. Posterior inferior cerebellar artery is patent bilaterally. Basilar artery is widely patent. Superior cerebellar and posterior cerebral arteries are patent without stenosis.  Internal carotid artery widely patent bilaterally. Anterior and middle cerebral arteries are patent bilaterally without stenosis  Negative for cerebral aneurysm.  MRA NECK FINDINGS  Two vessel arch. Kinking of the proximal left common carotid artery.  The carotid bifurcation is widely patent bilaterally without significant stenosis.  Both vertebral arteries are patent with anterograde flow and no significant stenosis. Negative for vertebral stenosis or dissection.  IMPRESSION: No acute intracranial abnormality. No acute infarct or mass. Normal enhancement. No evidence of cerebellar  abnormality.  Negative MRA of the circle Willis.  Negative MRA of the neck.   Electronically Signed   By: Marlan Palau M.D.   On: 08/10/2013 17:26    EKG: Independently reviewed. Sinus tachycardia at 102 beats per minute without acute findings.  Assessment/Plan Principal Problem:   Acute encephalopathy Active Problems:   Hypertension   Hypercholesterolemia   Unsteady gait   Altered mental state   Headache   Hypokalemia   Anemia   Acute encephalopathy - Unclear etiology. - Neurology consultation appreciated. A repeat MRI negative for stroke or cerebellitis. Possible complicated migraine Vs other etiology. - Admit to telemetry for close monitoring and evaluation. Frequent neuro checks. - Check EEG. Although low index of suspicion, check acetaminophen and salicylate levels.  Blood alcohol level <11 and UDS negative. - Clinically improved.  Hypertension - Mildly uncontrolled - When necessary hydralazine. Will start maintenance medications prior to DC.  Hypokalemia - Replete and follow BMP  Anemia - Hemoglobin appears to be at baseline.  Dehydration - Brief IV fluids and oral intake.  Chronic headaches - ? Migraines. Await neurology followup and input - Continue Naprosyn when necessary.    Code Status: Full  Family Communication: Discussed with boyfriend and 3 sisters at bedside.  Disposition Plan: Home when medically stable   Time spent: 65 minutes  Goldie Tregoning, MD, FACP, FHM. [facility] Pager [phone]  If 7PM-7AM, please contact night-coverage [email or web address] Password [facility] 08/10/2013, 6:29 PM

## 2013-08-10 NOTE — ED Notes (Signed)
Patient transported to MRI 

## 2013-08-11 ENCOUNTER — Observation Stay (HOSPITAL_COMMUNITY): Payer: Self-pay

## 2013-08-11 ENCOUNTER — Encounter (HOSPITAL_COMMUNITY): Payer: Self-pay | Admitting: General Practice

## 2013-08-11 DIAGNOSIS — R269 Unspecified abnormalities of gait and mobility: Secondary | ICD-10-CM

## 2013-08-11 DIAGNOSIS — E7800 Pure hypercholesterolemia, unspecified: Secondary | ICD-10-CM

## 2013-08-11 DIAGNOSIS — D649 Anemia, unspecified: Secondary | ICD-10-CM

## 2013-08-11 LAB — URINE CULTURE
Colony Count: NO GROWTH
Culture: NO GROWTH

## 2013-08-11 LAB — BASIC METABOLIC PANEL
BUN: 13 mg/dL (ref 6–23)
CO2: 26 mEq/L (ref 19–32)
Chloride: 109 mEq/L (ref 96–112)
Creatinine, Ser: 0.59 mg/dL (ref 0.50–1.10)
GFR calc Af Amer: >90 mL/min (ref >90)
Potassium: 3.8 mEq/L (ref 3.5–5.1)

## 2013-08-11 LAB — CBC
HCT: 30.5 % — ABNORMAL LOW (ref 36.0–46.0)
Hemoglobin: 9.9 g/dL — ABNORMAL LOW (ref 12.0–15.0)
MCHC: 32.5 g/dL (ref 30.0–36.0)
MCV: 85.7 fL (ref 78.0–100.0)

## 2013-08-11 MED ORDER — METOPROLOL TARTRATE 25 MG PO TABS
25.0000 mg | ORAL_TABLET | Freq: Two times a day (BID) | ORAL | Status: DC
  Administered 2013-08-11: 25 mg via ORAL
  Filled 2013-08-11 (×2): qty 1

## 2013-08-11 MED ORDER — LOVASTATIN 10 MG PO TABS: 10.0000 mg | ORAL_TABLET | Freq: Every day | ORAL | Status: DC

## 2013-08-11 MED ORDER — INFLUENZA VAC SPLIT QUAD 0.5 ML IM SUSP: 0.5000 mL | INTRAMUSCULAR | Status: DC

## 2013-08-11 MED ORDER — NAPROXEN 500 MG PO TABS: 500.0000 mg | ORAL_TABLET | Freq: Two times a day (BID) | ORAL | Status: DC | PRN

## 2013-08-11 MED ORDER — METOPROLOL TARTRATE 25 MG PO TABS: 25.0000 mg | ORAL_TABLET | Freq: Two times a day (BID) | ORAL | Status: DC

## 2013-08-11 NOTE — Progress Notes (Signed)
D/c orders received;IV removed with guaze on, pt remains in stable condition, pt meds and instructions reviewed and given to pt; pt d/c to home

## 2013-08-11 NOTE — Progress Notes (Signed)
Neurohospitalist Resident Progress Note:  Subjective: No acute events overnight.  Denies headache.   Objective: Vital signs in last 24 hours: Filed Vitals:   08/11/13 0200 08/11/13 0400 08/11/13 0600 08/11/13 1140  BP: 90/49 107/67 108/75 139/91  Pulse: 58 77 83 98  Temp:   98.4 F (36.9 C) 98.6 F (37 C)  TempSrc:   Oral Oral  Resp: 18 16 18 20   Height:      Weight:      SpO2: 97% 96% 96% 100%   Weight change:   Intake/Output Summary (Last 24 hours) at 08/11/13 1153 Last data filed at 08/11/13 0830  Gross per 24 hour  Intake    240 ml  Output      0 ml  Net    240 ml    Physical Exam: Gen: resting upright in bed with boyfriend at bedside, NAD Hear: RRR noo murmurs appreciated Lungs: clear bilaterally MENTAL STATUS EXAM: Orientation: Alert and oriented to person, place and time.  Memory: Cooperative, follows commands well. Recent and remote memory normal.  Attention, concentration: Attention span and concentration are normal.  Language: Speech is clear and language is normal.  Fund of knowledge: Aware of current events, vocabulary appropriate for patient age.  CRANIAL NERVES: intact MOTOR: 5/5 and symmetric strength upper and lower extremities Patellar: (R): 2+ (L): 2+  Achilles: (R): 2+ (L): 2+  SENSATION: Intact to touch throughout.  GAIT: Routine gait intact normal.   Lab Results: Basic Metabolic Panel:  Recent Labs Lab 08/10/13 1200 08/10/13 1208 08/11/13 0435  NA 144 144 145  K 3.3* 3.3* 3.8  CL 103 103 109  CO2 26  --  26  GLUCOSE 187* 193* 112*  BUN 16 16 13   CREATININE 0.62 0.90 0.59  CALCIUM 9.6  --  8.7  MG  --   --  2.3   Liver Function Tests:  Recent Labs Lab 08/10/13 1200  AST 14  ALT 11  ALKPHOS 104  BILITOT 0.3  PROT 7.8  ALBUMIN 4.2   CBC:  Recent Labs Lab 08/10/13 1200 08/10/13 1208 08/11/13 0435  WBC 11.7*  --  8.2  NEUTROABS 9.9*  --   --   HGB 11.4* 12.9 9.9*  HCT 34.6* 38.0 30.5*  MCV 85.9  --  85.7  PLT  289  --  297   Cardiac Enzymes:  Recent Labs Lab 08/10/13 1200  TROPONINI <0.30   CBG:  Recent Labs Lab 08/10/13 1312  GLUCAP 144*   Coagulation:  Recent Labs Lab 08/10/13 1200  LABPROT 13.4  INR 1.04   Urine Drug Screen: Drugs of Abuse     Component Value Date/Time   LABOPIA NONE DETECTED 08/10/2013 1305   COCAINSCRNUR NONE DETECTED 08/10/2013 1305   LABBENZ NONE DETECTED 08/10/2013 1305   AMPHETMU NONE DETECTED 08/10/2013 1305   THCU NONE DETECTED 08/10/2013 1305   LABBARB NONE DETECTED 08/10/2013 1305    Alcohol Level:  Recent Labs Lab 08/10/13 1200  ETH <11   Urinalysis:  Recent Labs Lab 08/10/13 1305  COLORURINE YELLOW  LABSPEC 1.014  PHURINE 5.5  GLUCOSEU 100*  HGBUR NEGATIVE  BILIRUBINUR NEGATIVE  KETONESUR NEGATIVE  PROTEINUR NEGATIVE  UROBILINOGEN 0.2  NITRITE NEGATIVE  LEUKOCYTESUR SMALL*   Studies/Results: Ct Head Wo Contrast  08/10/2013   CLINICAL DATA:  Patient not oriented last seen normal at 8:30 a.m.  EXAM: CT HEAD WITHOUT CONTRAST  TECHNIQUE: Contiguous axial images were obtained from the base of the skull through the vertex  without intravenous contrast.  COMPARISON:  None.  FINDINGS: There is no evidence of mass effect, midline shift or extra-axial fluid collections. There is no evidence of a space-occupying lesion or intracranial hemorrhage. There is no evidence of a cortical-based area of acute infarction.  The ventricles and sulci are appropriate for the patient's age. The basal cisterns are patent.  Visualized portions of the orbits are unremarkable. The visualized portions of the paranasal sinuses and mastoid air cells are unremarkable.  The osseous structures are unremarkable.  IMPRESSION: No acute intracranial pathology. These results were called by telephone at the time of interpretation on 08/10/2013 at 12:26 PM to Dr. Amada Jupiter, who verbally acknowledged these results.   Electronically Signed   By: Elige Ko   On: 08/10/2013 12:27    Mr Maxine Glenn Head Wo Contrast  08/10/2013   CLINICAL DATA:  Cerebellar symptoms. Rule out encephalitis or stroke.  EXAM: MRI HEAD WITHOUT AND WITH CONTRAST  MRA HEAD WITHOUT CONTRAST  MRA NECK WITHOUT AND WITH CONTRAST  TECHNIQUE: Multiplanar, multiecho pulse sequences of the brain and surrounding structures were obtained without and with intravenous contrast. Angiographic images of the Circle of Willis were obtained using MRA technique without intravenous contrast. Angiographic images of the neck were obtained using MRA technique without and with intravenous contrast. Carotid stenosis measurements (when applicable) are obtained utilizing NASCET criteria, using the distal internal carotid diameter as the denominator.  CONTRAST:  18mL MULTIHANCE GADOBENATE DIMEGLUMINE 529 MG/ML IV SOLN  COMPARISON:  MRI brain limited 08/10/2013, CT head 08/10/2013  FINDINGS: MRI HEAD FINDINGS  Ventricle size is normal. Pituitary normal in size. Craniocervical junction is normal.  Small hyperintensity in the right frontal operculum has a chronic appearance. No other white matter lesions. Brainstem and cerebellum appear normal.  Diffusion-weighted imaging is negative. The questioned abnormality in the left cerebellar tonsil on the prior diffusion-weighted imaging no longer visualized.  Negative for hemorrhage or fluid collection.  No mass or edema.  Postcontrast imaging reveals normal enhancement. Normal enhancement of the cerebellum.  MRA HEAD FINDINGS  Both vertebral arteries are widely patent. Posterior inferior cerebellar artery is patent bilaterally. Basilar artery is widely patent. Superior cerebellar and posterior cerebral arteries are patent without stenosis.  Internal carotid artery widely patent bilaterally. Anterior and middle cerebral arteries are patent bilaterally without stenosis  Negative for cerebral aneurysm.  MRA NECK FINDINGS  Two vessel arch. Kinking of the proximal left common carotid artery.  The carotid  bifurcation is widely patent bilaterally without significant stenosis.  Both vertebral arteries are patent with anterograde flow and no significant stenosis. Negative for vertebral stenosis or dissection.  IMPRESSION: No acute intracranial abnormality. No acute infarct or mass. Normal enhancement. No evidence of cerebellar abnormality.  Negative MRA of the circle Willis.  Negative MRA of the neck.   Electronically Signed   By: Marlan Palau M.D.   On: 08/10/2013 17:26   Mr Angiogram Neck W Wo Contrast  08/10/2013   CLINICAL DATA:  Cerebellar symptoms. Rule out encephalitis or stroke.  EXAM: MRI HEAD WITHOUT AND WITH CONTRAST  MRA HEAD WITHOUT CONTRAST  MRA NECK WITHOUT AND WITH CONTRAST  TECHNIQUE: Multiplanar, multiecho pulse sequences of the brain and surrounding structures were obtained without and with intravenous contrast. Angiographic images of the Circle of Willis were obtained using MRA technique without intravenous contrast. Angiographic images of the neck were obtained using MRA technique without and with intravenous contrast. Carotid stenosis measurements (when applicable) are obtained utilizing NASCET criteria, using the distal  internal carotid diameter as the denominator.  CONTRAST:  18mL MULTIHANCE GADOBENATE DIMEGLUMINE 529 MG/ML IV SOLN  COMPARISON:  MRI brain limited 08/10/2013, CT head 08/10/2013  FINDINGS: MRI HEAD FINDINGS  Ventricle size is normal. Pituitary normal in size. Craniocervical junction is normal.  Small hyperintensity in the right frontal operculum has a chronic appearance. No other white matter lesions. Brainstem and cerebellum appear normal.  Diffusion-weighted imaging is negative. The questioned abnormality in the left cerebellar tonsil on the prior diffusion-weighted imaging no longer visualized.  Negative for hemorrhage or fluid collection.  No mass or edema.  Postcontrast imaging reveals normal enhancement. Normal enhancement of the cerebellum.  MRA HEAD FINDINGS  Both  vertebral arteries are widely patent. Posterior inferior cerebellar artery is patent bilaterally. Basilar artery is widely patent. Superior cerebellar and posterior cerebral arteries are patent without stenosis.  Internal carotid artery widely patent bilaterally. Anterior and middle cerebral arteries are patent bilaterally without stenosis  Negative for cerebral aneurysm.  MRA NECK FINDINGS  Two vessel arch. Kinking of the proximal left common carotid artery.  The carotid bifurcation is widely patent bilaterally without significant stenosis.  Both vertebral arteries are patent with anterograde flow and no significant stenosis. Negative for vertebral stenosis or dissection.  IMPRESSION: No acute intracranial abnormality. No acute infarct or mass. Normal enhancement. No evidence of cerebellar abnormality.  Negative MRA of the circle Willis.  Negative MRA of the neck.   Electronically Signed   By: Marlan Palau M.D.   On: 08/10/2013 17:26   Mr Brain Wo Contrast  08/10/2013   CLINICAL DATA:  Code stroke  EXAM: MRI HEAD WITHOUT CONTRAST  TECHNIQUE: Multiplanar, multiecho pulse sequences of the brain and surrounding structures were obtained without intravenous contrast.  COMPARISON:  CT 08/10/2013  FINDINGS: Axial and coronal diffusion-weighted imaging only were obtained. No other sequences of the brain were obtained.  On coronal weighted imaging, there is a 6mm hyperintensity in the left cerebellar tonsil. This is not identified on the axial view and may be artifact related to adjacent skullbase. Small acute infarct less likely. No other areas of restricted diffusion are present. Ventricle size is normal.  IMPRESSION: Questionable area of restricted diffusion in the left cerebellar tonsil which is most likely artifact. This measures approximately 6 mm. No other areas of restricted diffusion.   Electronically Signed   By: Marlan Palau M.D.   On: 08/10/2013 12:59   Mr Laqueta Jean JY Contrast  08/10/2013   CLINICAL  DATA:  Cerebellar symptoms. Rule out encephalitis or stroke.  EXAM: MRI HEAD WITHOUT AND WITH CONTRAST  MRA HEAD WITHOUT CONTRAST  MRA NECK WITHOUT AND WITH CONTRAST  TECHNIQUE: Multiplanar, multiecho pulse sequences of the brain and surrounding structures were obtained without and with intravenous contrast. Angiographic images of the Circle of Willis were obtained using MRA technique without intravenous contrast. Angiographic images of the neck were obtained using MRA technique without and with intravenous contrast. Carotid stenosis measurements (when applicable) are obtained utilizing NASCET criteria, using the distal internal carotid diameter as the denominator.  CONTRAST:  18mL MULTIHANCE GADOBENATE DIMEGLUMINE 529 MG/ML IV SOLN  COMPARISON:  MRI brain limited 08/10/2013, CT head 08/10/2013  FINDINGS: MRI HEAD FINDINGS  Ventricle size is normal. Pituitary normal in size. Craniocervical junction is normal.  Small hyperintensity in the right frontal operculum has a chronic appearance. No other white matter lesions. Brainstem and cerebellum appear normal.  Diffusion-weighted imaging is negative. The questioned abnormality in the left cerebellar tonsil on the prior  diffusion-weighted imaging no longer visualized.  Negative for hemorrhage or fluid collection.  No mass or edema.  Postcontrast imaging reveals normal enhancement. Normal enhancement of the cerebellum.  MRA HEAD FINDINGS  Both vertebral arteries are widely patent. Posterior inferior cerebellar artery is patent bilaterally. Basilar artery is widely patent. Superior cerebellar and posterior cerebral arteries are patent without stenosis.  Internal carotid artery widely patent bilaterally. Anterior and middle cerebral arteries are patent bilaterally without stenosis  Negative for cerebral aneurysm.  MRA NECK FINDINGS  Two vessel arch. Kinking of the proximal left common carotid artery.  The carotid bifurcation is widely patent bilaterally without significant  stenosis.  Both vertebral arteries are patent with anterograde flow and no significant stenosis. Negative for vertebral stenosis or dissection.  IMPRESSION: No acute intracranial abnormality. No acute infarct or mass. Normal enhancement. No evidence of cerebellar abnormality.  Negative MRA of the circle Willis.  Negative MRA of the neck.   Electronically Signed   By: Marlan Palau M.D.   On: 08/10/2013 17:26   Medications: I have reviewed the patient's current medications. Scheduled Meds: . aspirin EC  81 mg Oral Daily  . enoxaparin (LOVENOX) injection  40 mg Subcutaneous Q24H  . [START ON 08/12/2013] influenza vac split quadrivalent PF  0.5 mL Intramuscular Tomorrow-1000  . potassium chloride  40 mEq Oral Once  . sodium chloride  3 mL Intravenous Q12H   Continuous Infusions:  PRN Meds:.acetaminophen, acetaminophen, albuterol, hydrALAZINE, HYDROmorphone (DILAUDID) injection, naproxen, ondansetron (ZOFRAN) IV, ondansetron, oxyCODONE  Assessment/Plan: Principal Problem:   Acute encephalopathy Active Problems:   Hypertension   Hypercholesterolemia   Unsteady gait   Altered mental state   Headache   Hypokalemia   Anemia  52 y.o. female admitted with AMS, end gaze nystagmus, dysarthria and decreased mental status and negative stroke imaging (MRI brain negative) thought likely secondary to complicated migraine. Migraine now resolved.  1) If migraines persists, consider MRI w/wo contrast(only had diffusion images on first study)  2) EEG pending 3) Plan for d/c home today.  Pt will need PCP.     LOS: 1 day   Manuela Schwartz, MD 08/11/2013, 11:53 AM  Wyatt Portela, MD

## 2013-08-11 NOTE — Procedures (Signed)
EEG report.  Brief clinical history: 52  years old with altered mental status. No prior history of frank epileptic seizures.  Technique: this is a 17 channel routine scalp EEG performed at the bedside with bipolar and monopolar montages arranged in accordance to the international 10/20 system of electrode placement. One channel was dedicated to EKG recording.  The study was performed during wakefulness and drowsiness drowsiness. No activating procedures performed.  Description:In the wakeful state, the best background consisted of a medium amplitude, posterior dominant, well sustained, symmetric and reactive 9 Hz rhythm. Drowsiness demonstrated dropout of the alpha rhythm. No focal or generalized epileptiform discharges noted.  No slowing seen.  EKG showed sinus rhythm.  Impression: this is a normal awake and drowsy EEG. Please, be aware that a normal EEG does not exclude the possibility of epilepsy.  Clinical correlation is advised.   Wyatt Portela, MD

## 2013-08-11 NOTE — Progress Notes (Signed)
UR completed 

## 2013-08-11 NOTE — Care Management Note (Signed)
    Page 1 of 1   08/11/2013     2:19:47 PM   CARE MANAGEMENT NOTE 08/11/2013  Patient:  Kendra Valenzuela, Kendra Valenzuela   Account Number:  [id number]  Date Initiated:  08/11/2013  Documentation initiated by:  GRAVES-BIGELOW,Ishmel Acevedo  Subjective/Objective Assessment:   Pt admitted for AMS. Pt has support of boyfriend. No insurance or PCP at this time.     Action/Plan:   CM did call the [facility] to see availability for PCP/hospital f/u. [facility] to call me back. CM did call [facility] for them to fax medication list. Pt has not had meds filled since May.   Anticipated DC Date:  08/11/2013   Anticipated DC Plan:  HOME/SELF CARE      DC Planning Services  CM consult  Medication Assistance  [facility]      Choice offered to / List presented to:             Status of service:  Completed, signed off Medicare Important Message given?   (If response is "NO", the following Medicare IM given date fields will be blank) Date Medicare IM given:   Date Additional Medicare IM given:    Discharge Disposition:  HOME/SELF CARE  Per UR Regulation:  Reviewed for med. necessity/level of care/duration of stay  If discussed at Long Length of Stay Meetings, dates discussed:    Comments:  08-11-13 1407 Tomi Bamberger, RN,BSN [phone] CM received medication list. Pt was taking metoprolol 25 mg tab and pravastatin- 29.00 at [facility]. MD please change that statin to Lovastatin 20 mg if possible to be obtained via [facility] $4.00 med list along with metoprolol. Will continue to monitor.

## 2013-08-11 NOTE — Progress Notes (Signed)
EEG Completed; Results Pending  

## 2013-08-11 NOTE — Discharge Summary (Signed)
Physician Discharge Summary  Kendra Valenzuela ZOX:[id number] DOB: March 01, 1961 DOA: 08/10/2013  PCP: No PCP Per Patient  Admit date: 08/10/2013 Discharge date: 08/11/2013  Time spent: Less than 30 minutes  Recommendations for Outpatient Follow-up:  1. [facility] dept: will call patient with FU appointment. Will need CBC at follow up.  Discharge Diagnoses:  Principal Problem:   Acute encephalopathy Active Problems:   Hypertension   Hypercholesterolemia   Unsteady gait   Altered mental state   Headache   Hypokalemia   Anemia   Discharge Condition: Improved & Stable  Diet recommendation: Heart Healthy diet.  Filed Weights   08/10/13 1947  Weight: 78.336 kg (172 lb 11.2 oz)    History of present illness:  52 y.o. female with history of hypertension, hyperlipidemia, chronic headaches, medication noncompliance for 2 months, presented to the ED with altered mental status. History is provided by patient's boyfriend who is at bedside and by the patient. 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 According to boyfriend, she was last heard speaking normally on the telephone at 8:30 PM on 08/09/13 at which time she complained of a headache and stated that she was going to sleep early. On 08/10/13, when the boyfriend tried to reach her on the phone at approximately 8:30 AM, he was unable to get her. He states that he went down to the place that she works and alerted the staff. He then found her sitting slumped on a chair, drowsy, slurred speech and her shirt worn backwards. Patient was brought to the ED as a code stroke. Neurology evaluated the patient. CT head was negative. As per neurology, initial MRI head also negative for stroke. Patient was tachycardic and received IV fluid bolus with improvement. Since then, mental status was significantly improved. Patient denies any headaches currently. She states that she remembers waking up at 1:15 AM to let an inmate in and then  doesn't remember anything. She gives history of 2-3 times per week of frontal headache, unclear quality, nonradiating, precipitated by stress and hunger with no clear relieving factors. No history of photophobia. Patient denies overdosing on any medications or doing drugs. Hospitalist admission requested for further evaluation.  Hospital Course:   Patient was admitted to the hospital. Neurology consulted. Imaging/MRI was negative for stroke or cerebellitis. Neurology felt that her presentation was secondary to complicated migraine. It could have also been secondary to Benadryl (subsequent history) which she apparently took the night before. She however did not complain of any further headaches in the hospital. Her mental status had started improving in the ED and after overnight observation, returned to baseline according to family/friends. EEG was performed which did not reveal any seizure like activity. Case management was able to call her pharmacy and obtain information about her previous medications. She was started back on metoprolol for hypertension, pravastatin that she was on before was changed to lovastatin. She was advised by case management that these medications could be obtained on $ 4 med list. Case management also contacted the [facility] health Department for followup appointment. Hypokalemia was corrected. Anemia is chronic and will need followup of CBC as outpatient. Patient has history of chronic headaches-? Migraine.   Consultations:  Neurology  Procedures:  EEG: Impression: this is a normal awake and drowsy EEG. Please, be aware that a normal EEG does not exclude the possibility of epilepsy.      Discharge Exam:  Complaints:  None. As per boyfriend, MS has returned to normal/baseline.  Filed Vitals:   08/11/13 0400 08/11/13 0600 08/11/13 1140 08/11/13 1621  BP: 107/67 108/75 139/91 122/83  Pulse: 77 83 98 109  Temp:  98.4 F (36.9 C) 98.6 F (37 C)   TempSrc:   Oral Oral   Resp: 16 18 20    Height:      Weight:      SpO2: 96% 96% 100%     General exam: Pleasant and comfortable Respiratory system: Clear. No increased work of breathing. Cardiovascular system: S1 & S2 heard, RRR. No JVD, murmurs, gallops, clicks or pedal edema. Tele: SR 90's-ST in 100's Gastrointestinal system: Abdomen is nondistended, soft and nontender. Normal bowel sounds heard. Central nervous system: Alert and oriented. No focal neurological deficits. Extremities: Symmetric 5 x 5 power.  Discharge Instructions      Discharge Orders   Future Orders Complete By Expires   Call MD for:  As directed    Comments:     Confusion/altered mental state.   Diet - low sodium heart healthy  As directed    Increase activity slowly  As directed        Medication List    STOP taking these medications       ALEVE 220 MG tablet  Generic drug:  naproxen sodium     diphenhydrAMINE 25 MG tablet  Commonly known as:  BENADRYL      TAKE these medications       acetaminophen 500 MG tablet  Commonly known as:  TYLENOL  Take 1,000 mg by mouth every 6 (six) hours as needed for pain.     aspirin EC 81 MG tablet  Take 81 mg by mouth daily.     lovastatin 10 MG tablet  Commonly known as:  MEVACOR  Take 1 tablet (10 mg total) by mouth at bedtime.     metoprolol tartrate 25 MG tablet  Commonly known as:  LOPRESSOR  Take 1 tablet (25 mg total) by mouth 2 (two) times daily.     multivitamin with minerals Tabs tablet  Take 1 tablet by mouth daily.     naproxen 500 MG tablet  Commonly known as:  NAPROSYN  Take 1 tablet (500 mg total) by mouth 2 (two) times daily as needed for headache.       Follow-up Information   Call [facility] Department. [facility] department has your number and they will f/u with you as well. )    Contact information:   [phone] [address]  [place], [place] [address]        The results of significant diagnostics from this  hospitalization (including imaging, microbiology, ancillary and laboratory) are listed below for reference.    Significant Diagnostic Studies: Dg Chest 2 View  07/21/2013   CLINICAL DATA:  Chest pain.  EXAM: CHEST  2 VIEW  COMPARISON:  03/02/2013.  FINDINGS: The cardiac silhouette, mediastinal and hilar contours are within normal limits and stable. The lungs are clear. No pleural effusion. The bony thorax is intact.  IMPRESSION: No acute cardiopulmonary findings.   Electronically Signed   By: Loralie Champagne M.D.   On: 07/21/2013 17:13   Ct Head Wo Contrast  08/10/2013   CLINICAL DATA:  Patient not oriented last seen normal at 8:30 a.m.  EXAM: CT HEAD WITHOUT CONTRAST  TECHNIQUE: Contiguous axial images were obtained from the base of the skull through the vertex without intravenous contrast.  COMPARISON:  None.  FINDINGS: There is no evidence of mass effect, midline shift or  extra-axial fluid collections. There is no evidence of a space-occupying lesion or intracranial hemorrhage. There is no evidence of a cortical-based area of acute infarction.  The ventricles and sulci are appropriate for the patient's age. The basal cisterns are patent.  Visualized portions of the orbits are unremarkable. The visualized portions of the paranasal sinuses and mastoid air cells are unremarkable.  The osseous structures are unremarkable.  IMPRESSION: No acute intracranial pathology. These results were called by telephone at the time of interpretation on 08/10/2013 at 12:26 PM to Dr. Amada Jupiter, who verbally acknowledged these results.   Electronically Signed   By: Elige Ko   On: 08/10/2013 12:27   Mr Maxine Glenn Head Wo Contrast  08/10/2013   CLINICAL DATA:  Cerebellar symptoms. Rule out encephalitis or stroke.  EXAM: MRI HEAD WITHOUT AND WITH CONTRAST  MRA HEAD WITHOUT CONTRAST  MRA NECK WITHOUT AND WITH CONTRAST  TECHNIQUE: Multiplanar, multiecho pulse sequences of the brain and surrounding structures were obtained without  and with intravenous contrast. Angiographic images of the Circle of Willis were obtained using MRA technique without intravenous contrast. Angiographic images of the neck were obtained using MRA technique without and with intravenous contrast. Carotid stenosis measurements (when applicable) are obtained utilizing NASCET criteria, using the distal internal carotid diameter as the denominator.  CONTRAST:  18mL MULTIHANCE GADOBENATE DIMEGLUMINE 529 MG/ML IV SOLN  COMPARISON:  MRI brain limited 08/10/2013, CT head 08/10/2013  FINDINGS: MRI HEAD FINDINGS  Ventricle size is normal. Pituitary normal in size. Craniocervical junction is normal.  Small hyperintensity in the right frontal operculum has a chronic appearance. No other white matter lesions. Brainstem and cerebellum appear normal.  Diffusion-weighted imaging is negative. The questioned abnormality in the left cerebellar tonsil on the prior diffusion-weighted imaging no longer visualized.  Negative for hemorrhage or fluid collection.  No mass or edema.  Postcontrast imaging reveals normal enhancement. Normal enhancement of the cerebellum.  MRA HEAD FINDINGS  Both vertebral arteries are widely patent. Posterior inferior cerebellar artery is patent bilaterally. Basilar artery is widely patent. Superior cerebellar and posterior cerebral arteries are patent without stenosis.  Internal carotid artery widely patent bilaterally. Anterior and middle cerebral arteries are patent bilaterally without stenosis  Negative for cerebral aneurysm.  MRA NECK FINDINGS  Two vessel arch. Kinking of the proximal left common carotid artery.  The carotid bifurcation is widely patent bilaterally without significant stenosis.  Both vertebral arteries are patent with anterograde flow and no significant stenosis. Negative for vertebral stenosis or dissection.  IMPRESSION: No acute intracranial abnormality. No acute infarct or mass. Normal enhancement. No evidence of cerebellar abnormality.   Negative MRA of the circle Willis.  Negative MRA of the neck.   Electronically Signed   By: Marlan Palau M.D.   On: 08/10/2013 17:26   Mr Angiogram Neck W Wo Contrast  08/10/2013   CLINICAL DATA:  Cerebellar symptoms. Rule out encephalitis or stroke.  EXAM: MRI HEAD WITHOUT AND WITH CONTRAST  MRA HEAD WITHOUT CONTRAST  MRA NECK WITHOUT AND WITH CONTRAST  TECHNIQUE: Multiplanar, multiecho pulse sequences of the brain and surrounding structures were obtained without and with intravenous contrast. Angiographic images of the Circle of Willis were obtained using MRA technique without intravenous contrast. Angiographic images of the neck were obtained using MRA technique without and with intravenous contrast. Carotid stenosis measurements (when applicable) are obtained utilizing NASCET criteria, using the distal internal carotid diameter as the denominator.  CONTRAST:  18mL MULTIHANCE GADOBENATE DIMEGLUMINE 529 MG/ML IV SOLN  COMPARISON:  MRI brain limited 08/10/2013, CT head 08/10/2013  FINDINGS: MRI HEAD FINDINGS  Ventricle size is normal. Pituitary normal in size. Craniocervical junction is normal.  Small hyperintensity in the right frontal operculum has a chronic appearance. No other white matter lesions. Brainstem and cerebellum appear normal.  Diffusion-weighted imaging is negative. The questioned abnormality in the left cerebellar tonsil on the prior diffusion-weighted imaging no longer visualized.  Negative for hemorrhage or fluid collection.  No mass or edema.  Postcontrast imaging reveals normal enhancement. Normal enhancement of the cerebellum.  MRA HEAD FINDINGS  Both vertebral arteries are widely patent. Posterior inferior cerebellar artery is patent bilaterally. Basilar artery is widely patent. Superior cerebellar and posterior cerebral arteries are patent without stenosis.  Internal carotid artery widely patent bilaterally. Anterior and middle cerebral arteries are patent bilaterally without stenosis   Negative for cerebral aneurysm.  MRA NECK FINDINGS  Two vessel arch. Kinking of the proximal left common carotid artery.  The carotid bifurcation is widely patent bilaterally without significant stenosis.  Both vertebral arteries are patent with anterograde flow and no significant stenosis. Negative for vertebral stenosis or dissection.  IMPRESSION: No acute intracranial abnormality. No acute infarct or mass. Normal enhancement. No evidence of cerebellar abnormality.  Negative MRA of the circle Willis.  Negative MRA of the neck.   Electronically Signed   By: Marlan Palau M.D.   On: 08/10/2013 17:26   Mr Brain Wo Contrast  08/10/2013   CLINICAL DATA:  Code stroke  EXAM: MRI HEAD WITHOUT CONTRAST  TECHNIQUE: Multiplanar, multiecho pulse sequences of the brain and surrounding structures were obtained without intravenous contrast.  COMPARISON:  CT 08/10/2013  FINDINGS: Axial and coronal diffusion-weighted imaging only were obtained. No other sequences of the brain were obtained.  On coronal weighted imaging, there is a 6mm hyperintensity in the left cerebellar tonsil. This is not identified on the axial view and may be artifact related to adjacent skullbase. Small acute infarct less likely. No other areas of restricted diffusion are present. Ventricle size is normal.  IMPRESSION: Questionable area of restricted diffusion in the left cerebellar tonsil which is most likely artifact. This measures approximately 6 mm. No other areas of restricted diffusion.   Electronically Signed   By: Marlan Palau M.D.   On: 08/10/2013 12:59   Mr Laqueta Jean ZO Contrast  08/10/2013   CLINICAL DATA:  Cerebellar symptoms. Rule out encephalitis or stroke.  EXAM: MRI HEAD WITHOUT AND WITH CONTRAST  MRA HEAD WITHOUT CONTRAST  MRA NECK WITHOUT AND WITH CONTRAST  TECHNIQUE: Multiplanar, multiecho pulse sequences of the brain and surrounding structures were obtained without and with intravenous contrast. Angiographic images of the Circle of  Willis were obtained using MRA technique without intravenous contrast. Angiographic images of the neck were obtained using MRA technique without and with intravenous contrast. Carotid stenosis measurements (when applicable) are obtained utilizing NASCET criteria, using the distal internal carotid diameter as the denominator.  CONTRAST:  18mL MULTIHANCE GADOBENATE DIMEGLUMINE 529 MG/ML IV SOLN  COMPARISON:  MRI brain limited 08/10/2013, CT head 08/10/2013  FINDINGS: MRI HEAD FINDINGS  Ventricle size is normal. Pituitary normal in size. Craniocervical junction is normal.  Small hyperintensity in the right frontal operculum has a chronic appearance. No other white matter lesions. Brainstem and cerebellum appear normal.  Diffusion-weighted imaging is negative. The questioned abnormality in the left cerebellar tonsil on the prior diffusion-weighted imaging no longer visualized.  Negative for hemorrhage or fluid collection.  No mass or edema.  Postcontrast imaging  reveals normal enhancement. Normal enhancement of the cerebellum.  MRA HEAD FINDINGS  Both vertebral arteries are widely patent. Posterior inferior cerebellar artery is patent bilaterally. Basilar artery is widely patent. Superior cerebellar and posterior cerebral arteries are patent without stenosis.  Internal carotid artery widely patent bilaterally. Anterior and middle cerebral arteries are patent bilaterally without stenosis  Negative for cerebral aneurysm.  MRA NECK FINDINGS  Two vessel arch. Kinking of the proximal left common carotid artery.  The carotid bifurcation is widely patent bilaterally without significant stenosis.  Both vertebral arteries are patent with anterograde flow and no significant stenosis. Negative for vertebral stenosis or dissection.  IMPRESSION: No acute intracranial abnormality. No acute infarct or mass. Normal enhancement. No evidence of cerebellar abnormality.  Negative MRA of the circle Willis.  Negative MRA of the neck.    Electronically Signed   By: Marlan Palau M.D.   On: 08/10/2013 17:26    Microbiology: Recent Results (from the past 240 hour(s))  URINE CULTURE     Status: None   Collection Time    08/10/13  1:05 PM      Result Value Range Status   Specimen Description URINE, CLEAN CATCH   Final   Special Requests NONE   Final   Culture  Setup Time     Final   Value: 08/10/2013 17:00     Performed at [facility] Count     Final   Value: NO GROWTH     Performed at [facility]   Culture     Final   Value: NO GROWTH     Performed at [facility]   Report Status 08/11/2013 FINAL   Final     Labs: Basic Metabolic Panel:  Recent Labs Lab 08/10/13 1200 08/10/13 1208 08/11/13 0435  NA 144 144 145  K 3.3* 3.3* 3.8  CL 103 103 109  CO2 26  --  26  GLUCOSE 187* 193* 112*  BUN 16 16 13   CREATININE 0.62 0.90 0.59  CALCIUM 9.6  --  8.7  MG  --   --  2.3   Liver Function Tests:  Recent Labs Lab 08/10/13 1200  AST 14  ALT 11  ALKPHOS 104  BILITOT 0.3  PROT 7.8  ALBUMIN 4.2   No results found for this basename: LIPASE, AMYLASE,  in the last 168 hours No results found for this basename: AMMONIA,  in the last 168 hours CBC:  Recent Labs Lab 08/10/13 1200 08/10/13 1208 08/11/13 0435  WBC 11.7*  --  8.2  NEUTROABS 9.9*  --   --   HGB 11.4* 12.9 9.9*  HCT 34.6* 38.0 30.5*  MCV 85.9  --  85.7  PLT 289  --  297   Cardiac Enzymes:  Recent Labs Lab 08/10/13 1200  TROPONINI <0.30   BNP: BNP (last 3 results)  Recent Labs  01/13/13 0745 07/21/13 1645  PROBNP 44.6 28.2   CBG:  Recent Labs Lab 08/10/13 1312  GLUCAP 144*    Additional labs: 1. Fasting lipids: Cholesterol 213, triglycerides 221, HDL 65, LDL 104 and VLDL 44 2. Salicylate level 2.1 3. Acetaminophen level <15 4. Urine pregnancy test: Negative 5. UDS: Negative 6. Blood alcohol level: <11   Signed:  Marcellus Scott, MD, FACP, FHM. [facility] Pager  [phone]  If 7PM-7AM, please contact night-coverage [email or web address] Password [facility] 08/11/2013, 4:52 PM

## 2013-10-02 ENCOUNTER — Emergency Department (HOSPITAL_COMMUNITY): Payer: BC Managed Care – PPO

## 2013-10-02 ENCOUNTER — Emergency Department (HOSPITAL_COMMUNITY)
Admission: EM | Admit: 2013-10-02 | Discharge: 2013-10-02 | Disposition: A | Discharge status: Home / Self Care | Payer: BC Managed Care – PPO | Attending: Emergency Medicine | Admitting: Emergency Medicine

## 2013-10-02 ENCOUNTER — Encounter (HOSPITAL_COMMUNITY): Payer: Self-pay | Admitting: Emergency Medicine

## 2013-10-02 DIAGNOSIS — Z3202 Encounter for pregnancy test, result negative: Secondary | ICD-10-CM | POA: Insufficient documentation

## 2013-10-02 DIAGNOSIS — Z7982 Long term (current) use of aspirin: Secondary | ICD-10-CM | POA: Insufficient documentation

## 2013-10-02 DIAGNOSIS — D219 Benign neoplasm of connective and other soft tissue, unspecified: Secondary | ICD-10-CM

## 2013-10-02 DIAGNOSIS — Z79899 Other long term (current) drug therapy: Secondary | ICD-10-CM | POA: Insufficient documentation

## 2013-10-02 DIAGNOSIS — D259 Leiomyoma of uterus, unspecified: Secondary | ICD-10-CM | POA: Insufficient documentation

## 2013-10-02 DIAGNOSIS — M549 Dorsalgia, unspecified: Secondary | ICD-10-CM | POA: Insufficient documentation

## 2013-10-02 DIAGNOSIS — I10 Essential (primary) hypertension: Secondary | ICD-10-CM | POA: Insufficient documentation

## 2013-10-02 LAB — URINE MICROSCOPIC-ADD ON

## 2013-10-02 LAB — CBC WITH DIFFERENTIAL/PLATELET
Basophils Absolute: 0 10*3/uL (ref 0.0–0.1)
Basophils Relative: 0 % (ref 0–1)
Eosinophils Absolute: 0.2 10*3/uL (ref 0.0–0.7)
Eosinophils Relative: 2 % (ref 0–5)
HEMATOCRIT: 33.2 % — AB (ref 36.0–46.0)
Hemoglobin: 10.8 g/dL — ABNORMAL LOW (ref 12.0–15.0)
LYMPHS ABS: 3.1 10*3/uL (ref 0.7–4.0)
LYMPHS PCT: 35 % (ref 12–46)
MCH: 28.6 pg (ref 26.0–34.0)
MCHC: 32.5 g/dL (ref 30.0–36.0)
MCV: 87.8 fL (ref 78.0–100.0)
MONO ABS: 0.5 10*3/uL (ref 0.1–1.0)
MONOS PCT: 6 % (ref 3–12)
Neutro Abs: 4.9 10*3/uL (ref 1.7–7.7)
Neutrophils Relative %: 56 % (ref 43–77)
Platelets: 257 10*3/uL (ref 150–400)
RBC: 3.78 MIL/uL — ABNORMAL LOW (ref 3.87–5.11)
RDW: 15 % (ref 11.5–15.5)
WBC: 8.6 10*3/uL (ref 4.0–10.5)

## 2013-10-02 LAB — COMPREHENSIVE METABOLIC PANEL
ALT: 14 U/L (ref 0–35)
AST: 15 U/L (ref 0–37)
Albumin: 4 g/dL (ref 3.5–5.2)
Alkaline Phosphatase: 96 U/L (ref 39–117)
BUN: 15 mg/dL (ref 6–23)
CALCIUM: 9.1 mg/dL (ref 8.4–10.5)
CO2: 26 meq/L (ref 19–32)
CREATININE: 0.62 mg/dL (ref 0.50–1.10)
Chloride: 101 mEq/L (ref 96–112)
GFR calc Af Amer: >90 mL/min (ref >90)
GFR calc non Af Amer: >90 mL/min (ref >90)
Glucose, Bld: 94 mg/dL (ref 70–99)
Potassium: 3.5 mEq/L — ABNORMAL LOW (ref 3.7–5.3)
Sodium: 140 mEq/L (ref 137–147)
Total Bilirubin: 0.2 mg/dL — ABNORMAL LOW (ref 0.3–1.2)
Total Protein: 7.8 g/dL (ref 6.0–8.3)

## 2013-10-02 LAB — URINALYSIS, ROUTINE W REFLEX MICROSCOPIC
Bilirubin Urine: NEGATIVE
GLUCOSE, UA: NEGATIVE mg/dL
HGB URINE DIPSTICK: NEGATIVE
Ketones, ur: NEGATIVE mg/dL
Nitrite: NEGATIVE
Protein, ur: NEGATIVE mg/dL
Specific Gravity, Urine: >1.03 — ABNORMAL HIGH (ref 1.005–1.030)
Urobilinogen, UA: 0.2 mg/dL (ref 0.0–1.0)
pH: 5.5 (ref 5.0–8.0)

## 2013-10-02 LAB — LIPASE, BLOOD: Lipase: 74 U/L — ABNORMAL HIGH (ref 11–59)

## 2013-10-02 LAB — PREGNANCY, URINE: PREG TEST UR: NEGATIVE

## 2013-10-02 MED ORDER — TRAMADOL HCL 50 MG PO TABS: 50.0000 mg | ORAL_TABLET | Freq: Four times a day (QID) | ORAL | Status: DC | PRN

## 2013-10-02 MED ORDER — SODIUM CHLORIDE 0.9 % IJ SOLN
INTRAMUSCULAR | Status: AC
  Filled 2013-10-02: qty 300

## 2013-10-02 MED ORDER — HYDROMORPHONE HCL PF 1 MG/ML IJ SOLN
1.0000 mg | Freq: Once | INTRAMUSCULAR | Status: AC
  Administered 2013-10-02: 1 mg via INTRAVENOUS
  Filled 2013-10-02: qty 1

## 2013-10-02 MED ORDER — IOHEXOL 300 MG/ML  SOLN
50.0000 mL | Freq: Once | INTRAMUSCULAR | Status: AC | PRN
  Administered 2013-10-02: 50 mL via ORAL

## 2013-10-02 MED ORDER — ONDANSETRON HCL 4 MG/2ML IJ SOLN
4.0000 mg | Freq: Once | INTRAMUSCULAR | Status: AC
  Administered 2013-10-02: 4 mg via INTRAVENOUS
  Filled 2013-10-02: qty 2

## 2013-10-02 MED ORDER — IOHEXOL 300 MG/ML  SOLN
100.0000 mL | Freq: Once | INTRAMUSCULAR | Status: AC | PRN
  Administered 2013-10-02: 100 mL via INTRAVENOUS

## 2013-10-02 NOTE — ED Notes (Signed)
Pt c/o pelvic pain that radiates to back x2 days. Pt denies n/v/d and denies dysuria.

## 2013-10-02 NOTE — Discharge Instructions (Signed)
Follow up with dr. Ferguson in 1-2 weeks °

## 2013-10-02 NOTE — ED Notes (Signed)
Patient with no complaints at this time. Respirations even and unlabored. Skin warm/dry. Discharge instructions reviewed with patient at this time. Patient given opportunity to voice concerns/ask questions. IV removed per policy and band-aid applied to site. Patient discharged at this time and left Emergency Department with steady gait.  

## 2013-10-02 NOTE — ED Provider Notes (Signed)
CSN: [id number]     Arrival date & time 10/02/13  1440 History   First MD Initiated Contact with Patient 10/02/13 1505     Chief Complaint  Patient presents with  . Pelvic Pain  . Back Pain   (Consider location/radiation/quality/duration/timing/severity/associated sxs/prior Treatment) Patient is a 53 y.o. female presenting with pelvic pain and back pain. The history is provided by the patient (the pt complains of abd pain).  Pelvic Pain This is a new problem. The current episode started more than 2 days ago. The problem occurs constantly. The problem has not changed since onset.Associated symptoms include abdominal pain. Pertinent negatives include no chest pain and no headaches. Nothing aggravates the symptoms. Nothing relieves the symptoms.  Back Pain Associated symptoms: abdominal pain and pelvic pain   Associated symptoms: no chest pain and no headaches     Past Medical History  Diagnosis Date  . Environmental allergies   . Back pain   . Chest pain 01/2013    Admitted to [facility] in 01/2013  . Hypertension 01/13/2013  . [id number](299.3)    Past Surgical History  Procedure Laterality Date  . Dilation and curettage of uterus    . Cardiac catheterization     Family History  Problem Relation Age of Onset  . Hypertension Father    History  Substance Use Topics  . Smoking status: Never Smoker   . Smokeless tobacco: Never Used  . Alcohol Use: No   OB History   Grav Para Term Preterm Abortions TAB SAB Ect Mult Living                 Review of Systems  Constitutional: Negative for appetite change and fatigue.  HENT: Negative for congestion, ear discharge and sinus pressure.   Eyes: Negative for discharge.  Respiratory: Negative for cough.   Cardiovascular: Negative for chest pain.  Gastrointestinal: Positive for abdominal pain. Negative for diarrhea.  Genitourinary: Positive for pelvic pain. Negative for frequency and hematuria.  Musculoskeletal: Positive for back pain.  Skin:  Negative for rash.  Neurological: Negative for seizures and headaches.  Psychiatric/Behavioral: Negative for hallucinations.    Allergies  Review of patient's allergies indicates no known allergies.  Home Medications   Current Outpatient Rx  Name  Route  Sig  Dispense  Refill  . acetaminophen (TYLENOL) 500 MG tablet   Oral   Take 1,000 mg by mouth every 6 (six) hours as needed for pain.         Marland Kitchen aspirin EC 81 MG tablet   Oral   Take 81 mg by mouth daily.         Marland Kitchen lovastatin (MEVACOR) 10 MG tablet   Oral   Take 1 tablet (10 mg total) by mouth at bedtime.   30 tablet   0   . metoprolol tartrate (LOPRESSOR) 25 MG tablet   Oral   Take 1 tablet (25 mg total) by mouth 2 (two) times daily.   60 tablet   0   . Multiple Vitamin (MULTIVITAMIN WITH MINERALS) TABS tablet   Oral   Take 1 tablet by mouth daily.         . traMADol (ULTRAM) 50 MG tablet   Oral   Take 1 tablet (50 mg total) by mouth every 6 (six) hours as needed.   30 tablet   0    BP 146/79  Pulse 66  Temp(Src) 97.5 F (36.4 C) (Oral)  Resp 20  SpO2 100% Physical Exam  Constitutional: She  is oriented to person, place, and time. She appears well-developed.  HENT:  Head: Normocephalic.  Eyes: Conjunctivae and EOM are normal. No scleral icterus.  Neck: Neck supple. No thyromegaly present.  Cardiovascular: Normal rate and regular rhythm.  Exam reveals no gallop and no friction rub.   No murmur heard. Pulmonary/Chest: No stridor. She has no wheezes. She has no rales. She exhibits no tenderness.  Abdominal: She exhibits no distension. There is tenderness. There is no rebound.  Minor llq and rlq pain and tenderness  Musculoskeletal: Normal range of motion. She exhibits no edema.  Lymphadenopathy:    She has no cervical adenopathy.  Neurological: She is oriented to person, place, and time. She exhibits normal muscle tone. Coordination normal.  Skin: No rash noted. No erythema.  Psychiatric: She has a  normal mood and affect. Her behavior is normal.    ED Course  Procedures (including critical care time) Labs Review Labs Reviewed  URINALYSIS, ROUTINE W REFLEX MICROSCOPIC - Abnormal; Notable for the following:    Specific Gravity, Urine >1.030 (*)    Leukocytes, UA TRACE (*)    All other components within normal limits  CBC WITH DIFFERENTIAL - Abnormal; Notable for the following:    RBC 3.78 (*)    Hemoglobin 10.8 (*)    HCT 33.2 (*)    All other components within normal limits  COMPREHENSIVE METABOLIC PANEL - Abnormal; Notable for the following:    Potassium 3.5 (*)    Total Bilirubin 0.2 (*)    All other components within normal limits  LIPASE, BLOOD - Abnormal; Notable for the following:    Lipase 74 (*)    All other components within normal limits  URINE MICROSCOPIC-ADD ON - Abnormal; Notable for the following:    Squamous Epithelial / LPF MANY (*)    Bacteria, UA FEW (*)    All other components within normal limits  URINE CULTURE  PREGNANCY, URINE   Imaging Review Ct Abdomen Pelvis W Contrast  10/02/2013   CLINICAL DATA:  Right lower quadrant abdominal pain for few days.  EXAM: CT ABDOMEN AND PELVIS WITH CONTRAST  TECHNIQUE: Multidetector CT imaging of the abdomen and pelvis was performed using the standard protocol following bolus administration of intravenous contrast.  CONTRAST:  4mL OMNIPAQUE IOHEXOL 300 MG/ML SOLN, 151mL OMNIPAQUE IOHEXOL 300 MG/ML SOLN  COMPARISON:  CT ABD/PELV WO CM dated 01/18/2012  FINDINGS: Lung bases are clear. Heart size at upper limits of normal where visualized. The stomach is distended by ingested oral contrast but otherwise unremarkable. Dependent gallstones are identified within the otherwise normal-appearing gallbladder. Liver, adrenal glands, kidneys, and spleen are unremarkable.  Calcified fibroid uterus reidentified. Ovaries are normal, image 58. The appendix is normal. No bowel wall thickening or focal segmental dilatation. 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 No radiopaque renal, ureteral, or bladder calculus. No ascites, lymphadenopathy, or free air.  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 No acute osseous abnormality.  IMPRESSION: No acute intra-abdominal or pelvic pathology.  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   Electronically Signed   By: Conchita Paris M.D.   On: 10/02/2013 17:25    EKG Interpretation   None       MDM   1. Fibroids  Maudry Diego, MD 10/02/13 [phone]

## 2013-10-03 LAB — URINE CULTURE: Colony Count: 35000

## 2014-01-18 ENCOUNTER — Emergency Department (HOSPITAL_COMMUNITY)
Admission: EM | Admit: 2014-01-18 | Discharge: 2014-01-19 | Disposition: A | Discharge status: Home / Self Care | Payer: BC Managed Care – PPO | Attending: Emergency Medicine | Admitting: Emergency Medicine

## 2014-01-18 ENCOUNTER — Encounter (HOSPITAL_COMMUNITY): Payer: Self-pay | Admitting: Emergency Medicine

## 2014-01-18 DIAGNOSIS — R1013 Epigastric pain: Secondary | ICD-10-CM | POA: Insufficient documentation

## 2014-01-18 DIAGNOSIS — Z79899 Other long term (current) drug therapy: Secondary | ICD-10-CM | POA: Insufficient documentation

## 2014-01-18 DIAGNOSIS — R1110 Vomiting, unspecified: Secondary | ICD-10-CM | POA: Insufficient documentation

## 2014-01-18 DIAGNOSIS — I10 Essential (primary) hypertension: Secondary | ICD-10-CM | POA: Insufficient documentation

## 2014-01-18 DIAGNOSIS — Z9889 Other specified postprocedural states: Secondary | ICD-10-CM | POA: Insufficient documentation

## 2014-01-18 DIAGNOSIS — R109 Unspecified abdominal pain: Secondary | ICD-10-CM

## 2014-01-18 DIAGNOSIS — Z7982 Long term (current) use of aspirin: Secondary | ICD-10-CM | POA: Insufficient documentation

## 2014-01-18 DIAGNOSIS — R748 Abnormal levels of other serum enzymes: Secondary | ICD-10-CM | POA: Insufficient documentation

## 2014-01-18 LAB — COMPREHENSIVE METABOLIC PANEL
ALBUMIN: 4.1 g/dL (ref 3.5–5.2)
ALT: 68 U/L — AB (ref 0–35)
AST: 187 U/L — AB (ref 0–37)
Alkaline Phosphatase: 102 U/L (ref 39–117)
BUN: 19 mg/dL (ref 6–23)
CALCIUM: 9.2 mg/dL (ref 8.4–10.5)
CO2: 25 mEq/L (ref 19–32)
CREATININE: 0.6 mg/dL (ref 0.50–1.10)
Chloride: 104 mEq/L (ref 96–112)
GFR calc Af Amer: >90 mL/min (ref >90)
GFR calc non Af Amer: >90 mL/min (ref >90)
Glucose, Bld: 104 mg/dL — ABNORMAL HIGH (ref 70–99)
Potassium: 3.8 mEq/L (ref 3.7–5.3)
Sodium: 142 mEq/L (ref 137–147)
TOTAL PROTEIN: 7.4 g/dL (ref 6.0–8.3)
Total Bilirubin: 0.4 mg/dL (ref 0.3–1.2)

## 2014-01-18 LAB — CBC WITH DIFFERENTIAL/PLATELET
BASOS ABS: 0 10*3/uL (ref 0.0–0.1)
BASOS PCT: 0 % (ref 0–1)
EOS PCT: 3 % (ref 0–5)
Eosinophils Absolute: 0.2 10*3/uL (ref 0.0–0.7)
HCT: 34.7 % — ABNORMAL LOW (ref 36.0–46.0)
Hemoglobin: 11.3 g/dL — ABNORMAL LOW (ref 12.0–15.0)
LYMPHS PCT: 46 % (ref 12–46)
Lymphs Abs: 2.5 10*3/uL (ref 0.7–4.0)
MCH: 28.6 pg (ref 26.0–34.0)
MCHC: 32.6 g/dL (ref 30.0–36.0)
MCV: 87.8 fL (ref 78.0–100.0)
MONO ABS: 0.3 10*3/uL (ref 0.1–1.0)
Monocytes Relative: 6 % (ref 3–12)
Neutro Abs: 2.5 10*3/uL (ref 1.7–7.7)
Neutrophils Relative %: 45 % (ref 43–77)
Platelets: 320 10*3/uL (ref 150–400)
RBC: 3.95 MIL/uL (ref 3.87–5.11)
RDW: 15.5 % (ref 11.5–15.5)
WBC: 5.5 10*3/uL (ref 4.0–10.5)

## 2014-01-18 LAB — URINALYSIS, ROUTINE W REFLEX MICROSCOPIC
Bilirubin Urine: NEGATIVE
Glucose, UA: NEGATIVE mg/dL
HGB URINE DIPSTICK: NEGATIVE
Ketones, ur: NEGATIVE mg/dL
Nitrite: NEGATIVE
PROTEIN: NEGATIVE mg/dL
Specific Gravity, Urine: 1.02 (ref 1.005–1.030)
UROBILINOGEN UA: 0.2 mg/dL (ref 0.0–1.0)
pH: 5.5 (ref 5.0–8.0)

## 2014-01-18 LAB — URINE MICROSCOPIC-ADD ON

## 2014-01-18 LAB — LIPASE, BLOOD: Lipase: 31 U/L (ref 11–59)

## 2014-01-18 MED ORDER — ONDANSETRON HCL 4 MG/2ML IJ SOLN
4.0000 mg | Freq: Once | INTRAMUSCULAR | Status: AC
  Administered 2014-01-18: 4 mg via INTRAVENOUS
  Filled 2014-01-18: qty 2

## 2014-01-18 MED ORDER — HYDROMORPHONE HCL PF 1 MG/ML IJ SOLN
0.5000 mg | Freq: Once | INTRAMUSCULAR | Status: AC
Start: 2014-01-19 — End: 2014-01-19
  Administered 2014-01-19: 0.5 mg via INTRAVENOUS
  Filled 2014-01-18: qty 1

## 2014-01-18 MED ORDER — HYDROMORPHONE HCL PF 1 MG/ML IJ SOLN
0.5000 mg | Freq: Once | INTRAMUSCULAR | Status: AC
  Administered 2014-01-18: 0.5 mg via INTRAVENOUS
  Filled 2014-01-18: qty 1

## 2014-01-18 MED ORDER — ONDANSETRON HCL 4 MG/2ML IJ SOLN: 4.0000 mg | Freq: Once | INTRAMUSCULAR | Status: AC

## 2014-01-18 MED ORDER — PANTOPRAZOLE SODIUM 40 MG IV SOLR
40.0000 mg | Freq: Once | INTRAVENOUS | Status: AC
  Administered 2014-01-18: 40 mg via INTRAVENOUS
  Filled 2014-01-18: qty 40

## 2014-01-18 MED ORDER — ONDANSETRON HCL 4 MG/2ML IJ SOLN
4.0000 mg | Freq: Once | INTRAMUSCULAR | Status: AC
  Administered 2014-01-19: 4 mg via INTRAVENOUS
  Filled 2014-01-18: qty 2

## 2014-01-18 NOTE — ED Provider Notes (Signed)
CSN: [id number]     Arrival date & time 01/18/14  2100 History   First MD Initiated Contact with Patient 01/18/14 2257 This chart was scribed for Maudry Diego, MD by Anastasia Pall, ED Scribe. This patient was seen in room [facility] and the patient's care was started at 11:03 PM.    Chief Complaint  Patient presents with  . Abdominal Pain   (Consider location/radiation/quality/duration/timing/severity/associated sxs/prior Treatment) Patient is a 53 y.o. female presenting with abdominal pain. The history is provided by the patient. No language interpreter was used.  Abdominal Pain Pain location:  Epigastric Pain radiates to:  Does not radiate Pain severity:  Moderate Duration: earlier this evening. Timing:  Constant Chronicity:  New Ineffective treatments:  None tried Associated symptoms: vomiting (2 episodes)   Associated symptoms: no diarrhea and no fever   Vomiting:    Number of occurrences:  2   Timing:  Intermittent  HPI Comments: LARENE ASCENCIO is a 53 y.o. female who presents to the Emergency Department complaining of constant, epigastric abdominal pain, onset earlier this evening, with associated 2 episodes of vomiting. She denies having taken any medication. She denies diarrhea, and any other associated symptoms. She denies h/o abdominal surgeries.   PCP  - No PCP Per Patient  Past Medical History  Diagnosis Date  . Environmental allergies   . Back pain   . Chest pain 01/2013    Admitted to [facility] in 01/2013  . Hypertension 01/13/2013  . [id number](474.2)    Past Surgical History  Procedure Laterality Date  . Dilation and curettage of uterus    . Cardiac catheterization     Family History  Problem Relation Age of Onset  . Hypertension Father    History  Substance Use Topics  . Smoking status: Never Smoker   . Smokeless tobacco: Never Used  . Alcohol Use: No   OB History   Grav Para Term Preterm Abortions TAB SAB Ect Mult Living                 Review  of Systems  Constitutional: Negative for fever.  Gastrointestinal: Positive for vomiting (2 episodes) and abdominal pain (epigastric). Negative for diarrhea.   Allergies  Review of patient's allergies indicates no known allergies.  Home Medications   Prior to Admission medications   Medication Sig Start Date End Date Taking? Authorizing Provider  acetaminophen (TYLENOL) 500 MG tablet Take 1,000 mg by mouth every 6 (six) hours as needed for pain.   Yes Historical Provider, MD  aspirin EC 81 MG tablet Take 81 mg by mouth daily.   Yes Historical Provider, MD  lovastatin (MEVACOR) 10 MG tablet Take 1 tablet (10 mg total) by mouth at bedtime. 08/11/13  Yes Modena Jansky, MD  metoprolol tartrate (LOPRESSOR) 25 MG tablet Take 1 tablet (25 mg total) by mouth 2 (two) times daily. 08/11/13  Yes Modena Jansky, MD   BP 155/88  Pulse 100  Temp(Src) 98.5 F (36.9 C) (Oral)  Resp 16  Wt 177 lb 3.2 oz (80.377 kg)  SpO2 99%  Physical Exam  Constitutional: She is oriented to person, place, and time. She appears well-developed and well-nourished. No distress.  HENT:  Head: Normocephalic and atraumatic.  Right Ear: External ear normal.  Left Ear: External ear normal.  Eyes: Conjunctivae and EOM are normal. No scleral icterus.  Neck: Neck supple.  Cardiovascular: Normal rate.   Pulmonary/Chest: Effort normal.  Abdominal: Soft. She exhibits no distension. There is tenderness (  moderate epigastric tenderness). There is no rebound.  Musculoskeletal: Normal range of motion. She exhibits no edema.  Neurological: She is oriented to person, place, and time. She exhibits normal muscle tone. Coordination normal.  Skin: No rash noted. No erythema.  Psychiatric: She has a normal mood and affect. Her behavior is normal.    ED Course  Procedures (including critical care time)  DIAGNOSTIC STUDIES: Oxygen Saturation is 99% on room air, normal by my interpretation.    COORDINATION OF CARE: 11:06  PM-Discussed treatment plan which includes Zofran with pt at bedside and pt agreed to plan.   Results for orders placed during the hospital encounter of 01/18/14  URINALYSIS, ROUTINE W REFLEX MICROSCOPIC      Result Value Ref Range   Color, Urine YELLOW  YELLOW   APPearance CLEAR  CLEAR   Specific Gravity, Urine 1.020  1.005 - 1.030   pH 5.5  5.0 - 8.0   Glucose, UA NEGATIVE  NEGATIVE mg/dL   Hgb urine dipstick NEGATIVE  NEGATIVE   Bilirubin Urine NEGATIVE  NEGATIVE   Ketones, ur NEGATIVE  NEGATIVE mg/dL   Protein, ur NEGATIVE  NEGATIVE mg/dL   Urobilinogen, UA 0.2  0.0 - 1.0 mg/dL   Nitrite NEGATIVE  NEGATIVE   Leukocytes, UA SMALL (*) NEGATIVE  CBC WITH DIFFERENTIAL      Result Value Ref Range   WBC 5.5  4.0 - 10.5 K/uL   RBC 3.95  3.87 - 5.11 MIL/uL   Hemoglobin 11.3 (*) 12.0 - 15.0 g/dL   HCT 34.7 (*) 36.0 - 46.0 %   MCV 87.8  78.0 - 100.0 fL   MCH 28.6  26.0 - 34.0 pg   MCHC 32.6  30.0 - 36.0 g/dL   RDW 15.5  11.5 - 15.5 %   Platelets 320  150 - 400 K/uL   Neutrophils Relative % 45  43 - 77 %   Neutro Abs 2.5  1.7 - 7.7 K/uL   Lymphocytes Relative 46  12 - 46 %   Lymphs Abs 2.5  0.7 - 4.0 K/uL   Monocytes Relative 6  3 - 12 %   Monocytes Absolute 0.3  0.1 - 1.0 K/uL   Eosinophils Relative 3  0 - 5 %   Eosinophils Absolute 0.2  0.0 - 0.7 K/uL   Basophils Relative 0  0 - 1 %   Basophils Absolute 0.0  0.0 - 0.1 K/uL  COMPREHENSIVE METABOLIC PANEL      Result Value Ref Range   Sodium 142  137 - 147 mEq/L   Potassium 3.8  3.7 - 5.3 mEq/L   Chloride 104  96 - 112 mEq/L   CO2 25  19 - 32 mEq/L   Glucose, Bld 104 (*) 70 - 99 mg/dL   BUN 19  6 - 23 mg/dL   Creatinine, Ser 0.60  0.50 - 1.10 mg/dL   Calcium 9.2  8.4 - 10.5 mg/dL   Total Protein 7.4  6.0 - 8.3 g/dL   Albumin 4.1  3.5 - 5.2 g/dL   AST 187 (*) 0 - 37 U/L   ALT 68 (*) 0 - 35 U/L   Alkaline Phosphatase 102  39 - 117 U/L   Total Bilirubin 0.4  0.3 - 1.2 mg/dL   GFR calc non Af Amer >90  >90 mL/min   GFR  calc Af Amer >90  >90 mL/min  LIPASE, BLOOD      Result Value Ref Range   Lipase 31  11 - 59 U/L  URINE MICROSCOPIC-ADD ON      Result Value Ref Range   Squamous Epithelial / LPF MANY (*) RARE   WBC, UA 0-2  <3 WBC/hpf   RBC / HPF 0-2  <3 RBC/hpf   Bacteria, UA MANY (*) RARE   Urine-Other TRICHOMONAS PRESENT     No results found.   EKG Interpretation None     Medications  ondansetron (ZOFRAN) injection 4 mg (4 mg Intravenous Given 01/18/14 2252)  pantoprazole (PROTONIX) injection 40 mg (40 mg Intravenous Given 01/18/14 2324)  ondansetron (ZOFRAN) injection 4 mg (0 mg Intravenous Duplicate 05/26/90 5056)  HYDROmorphone (DILAUDID) injection 0.5 mg (0.5 mg Intravenous Given 01/18/14 2322)   MDM   Final diagnoses:  None   Pt with epigastric pain and vomiting.  She does not want admission.  Will return in am for US gallbladder. Gall stones seen on ct   The chart was scribed for me under my direct supervision.  I personally performed the history, physical, and medical decision making and all procedures in the evaluation of this patient.Maudry Diego, MD 01/19/14 0157

## 2014-01-18 NOTE — ED Notes (Signed)
Patient reports upper abdominal pain that started this evening. Reports emesis x 1. Denies diarrhea. Last bowel movement today.

## 2014-01-19 ENCOUNTER — Encounter (HOSPITAL_COMMUNITY): Payer: Self-pay | Admitting: Emergency Medicine

## 2014-01-19 ENCOUNTER — Ambulatory Visit (HOSPITAL_COMMUNITY)
Admit: 2014-01-19 | Discharge: 2014-01-19 | Disposition: A | Discharge status: Home / Self Care | Payer: BC Managed Care – PPO | Source: Ambulatory Visit | Attending: Emergency Medicine | Admitting: Emergency Medicine

## 2014-01-19 ENCOUNTER — Emergency Department (HOSPITAL_COMMUNITY): Payer: BC Managed Care – PPO

## 2014-01-19 ENCOUNTER — Emergency Department (HOSPITAL_COMMUNITY)
Admission: EM | Admit: 2014-01-19 | Discharge: 2014-01-19 | Disposition: A | Discharge status: Home / Self Care | Payer: BC Managed Care – PPO | Attending: Emergency Medicine | Admitting: Emergency Medicine

## 2014-01-19 DIAGNOSIS — I10 Essential (primary) hypertension: Secondary | ICD-10-CM | POA: Insufficient documentation

## 2014-01-19 DIAGNOSIS — Z7982 Long term (current) use of aspirin: Secondary | ICD-10-CM | POA: Insufficient documentation

## 2014-01-19 DIAGNOSIS — K8000 Calculus of gallbladder with acute cholecystitis without obstruction: Secondary | ICD-10-CM | POA: Insufficient documentation

## 2014-01-19 DIAGNOSIS — Z9889 Other specified postprocedural states: Secondary | ICD-10-CM | POA: Insufficient documentation

## 2014-01-19 DIAGNOSIS — K8010 Calculus of gallbladder with chronic cholecystitis without obstruction: Secondary | ICD-10-CM | POA: Insufficient documentation

## 2014-01-19 DIAGNOSIS — K819 Cholecystitis, unspecified: Secondary | ICD-10-CM

## 2014-01-19 DIAGNOSIS — Z79899 Other long term (current) drug therapy: Secondary | ICD-10-CM | POA: Insufficient documentation

## 2014-01-19 LAB — COMPREHENSIVE METABOLIC PANEL
ALBUMIN: 4.7 g/dL (ref 3.5–5.2)
ALK PHOS: 108 U/L (ref 39–117)
ALT: 51 U/L — ABNORMAL HIGH (ref 0–35)
AST: 50 U/L — ABNORMAL HIGH (ref 0–37)
BILIRUBIN TOTAL: 0.7 mg/dL (ref 0.3–1.2)
BUN: 15 mg/dL (ref 6–23)
CO2: 27 mEq/L (ref 19–32)
Calcium: 9.6 mg/dL (ref 8.4–10.5)
Chloride: 100 mEq/L (ref 96–112)
Creatinine, Ser: 0.58 mg/dL (ref 0.50–1.10)
GFR calc non Af Amer: >90 mL/min (ref >90)
Glucose, Bld: 84 mg/dL (ref 70–99)
POTASSIUM: 3.6 meq/L — AB (ref 3.7–5.3)
Sodium: 141 mEq/L (ref 137–147)
TOTAL PROTEIN: 8.3 g/dL (ref 6.0–8.3)

## 2014-01-19 LAB — URINE MICROSCOPIC-ADD ON

## 2014-01-19 LAB — URINALYSIS, ROUTINE W REFLEX MICROSCOPIC
BILIRUBIN URINE: NEGATIVE
Glucose, UA: NEGATIVE mg/dL
Hgb urine dipstick: NEGATIVE
KETONES UR: NEGATIVE mg/dL
Nitrite: NEGATIVE
Protein, ur: NEGATIVE mg/dL
SPECIFIC GRAVITY, URINE: 1.02 (ref 1.005–1.030)
UROBILINOGEN UA: 0.2 mg/dL (ref 0.0–1.0)
pH: 5.5 (ref 5.0–8.0)

## 2014-01-19 LAB — CBC WITH DIFFERENTIAL/PLATELET
BASOS PCT: 0 % (ref 0–1)
Basophils Absolute: 0 10*3/uL (ref 0.0–0.1)
EOS ABS: 0.2 10*3/uL (ref 0.0–0.7)
Eosinophils Relative: 2 % (ref 0–5)
HEMATOCRIT: 37.9 % (ref 36.0–46.0)
HEMOGLOBIN: 12.4 g/dL (ref 12.0–15.0)
Lymphocytes Relative: 29 % (ref 12–46)
Lymphs Abs: 3.2 10*3/uL (ref 0.7–4.0)
MCH: 28.6 pg (ref 26.0–34.0)
MCHC: 32.7 g/dL (ref 30.0–36.0)
MCV: 87.5 fL (ref 78.0–100.0)
MONO ABS: 0.8 10*3/uL (ref 0.1–1.0)
Monocytes Relative: 7 % (ref 3–12)
Neutro Abs: 6.7 10*3/uL (ref 1.7–7.7)
Neutrophils Relative %: 62 % (ref 43–77)
Platelets: 385 10*3/uL (ref 150–400)
RBC: 4.33 MIL/uL (ref 3.87–5.11)
RDW: 15.4 % (ref 11.5–15.5)
WBC: 10.9 10*3/uL — ABNORMAL HIGH (ref 4.0–10.5)

## 2014-01-19 LAB — LIPASE, BLOOD: Lipase: 20 U/L (ref 11–59)

## 2014-01-19 MED ORDER — RANITIDINE HCL 150 MG PO CAPS: 150.0000 mg | ORAL_CAPSULE | Freq: Every day | ORAL | Status: DC

## 2014-01-19 MED ORDER — METOCLOPRAMIDE HCL 5 MG/ML IJ SOLN
10.0000 mg | Freq: Once | INTRAMUSCULAR | Status: AC
  Administered 2014-01-19: 10 mg via INTRAVENOUS
  Filled 2014-01-19: qty 2

## 2014-01-19 MED ORDER — TRAMADOL HCL 50 MG PO TABS: 50.0000 mg | ORAL_TABLET | Freq: Four times a day (QID) | ORAL | Status: DC | PRN

## 2014-01-19 MED ORDER — PROMETHAZINE HCL 25 MG PO TABS: 25.0000 mg | ORAL_TABLET | Freq: Four times a day (QID) | ORAL | Status: DC | PRN

## 2014-01-19 MED ORDER — IOHEXOL 300 MG/ML  SOLN
100.0000 mL | Freq: Once | INTRAMUSCULAR | Status: AC | PRN
  Administered 2014-01-19: 100 mL via INTRAVENOUS

## 2014-01-19 MED ORDER — IOHEXOL 300 MG/ML  SOLN
50.0000 mL | Freq: Once | INTRAMUSCULAR | Status: AC | PRN
  Administered 2014-01-19: 50 mL via ORAL

## 2014-01-19 MED ORDER — HYDROMORPHONE HCL PF 1 MG/ML IJ SOLN
0.5000 mg | Freq: Once | INTRAMUSCULAR | Status: AC
  Administered 2014-01-19: 0.5 mg via INTRAVENOUS
  Filled 2014-01-19: qty 1

## 2014-01-19 NOTE — ED Provider Notes (Signed)
CSN: [id number]     Arrival date & time 01/19/14  1527 History   First MD Initiated Contact with Patient 01/19/14 1520     Chief Complaint  Patient presents with  . Abdominal Pain     (Consider location/radiation/quality/duration/timing/severity/associated sxs/prior Treatment) HPI Comments: Patient returns for ultrasound of gallbladder. She was seen last night with 2 days of epigastric and right upper quadrant pain with vomiting. Multiple gallstones are seen on CT scan. Ultrasound today shows gallstones and cholecystitis. She complains of epigastric pain and right upper quadrant pain with vomiting. She's not drinking since she returned home. She denies any chest pain, shortness of breath, fever or chills. No diarrhea.  The history is provided by the patient.    Past Medical History  Diagnosis Date  . Environmental allergies   . Back pain   . Chest pain 01/2013    Admitted to [facility] in 01/2013  . Hypertension 01/13/2013  . [id number](734.1)    Past Surgical History  Procedure Laterality Date  . Dilation and curettage of uterus    . Cardiac catheterization     Family History  Problem Relation Age of Onset  . Hypertension Father    History  Substance Use Topics  . Smoking status: Never Smoker   . Smokeless tobacco: Never Used  . Alcohol Use: No   OB History   Grav Para Term Preterm Abortions TAB SAB Ect Mult Living                 Review of Systems  Constitutional: Positive for activity change and appetite change. Negative for fever.  Respiratory: Negative for chest tightness and shortness of breath.   Gastrointestinal: Positive for nausea, vomiting and abdominal pain.  Genitourinary: Negative for dysuria, vaginal bleeding and vaginal discharge.  Musculoskeletal: Negative for back pain.  Skin: Negative for rash.  Neurological: Negative for dizziness and weakness.  A complete 10 system review of systems was obtained and all systems are negative except as noted in the HPI and  PMH.      Allergies  Review of patient's allergies indicates no known allergies.  Home Medications   Prior to Admission medications   Medication Sig Start Date End Date Taking? Authorizing Provider  acetaminophen (TYLENOL) 500 MG tablet Take 1,000 mg by mouth every 6 (six) hours as needed for pain.    Historical Provider, MD  aspirin EC 81 MG tablet Take 81 mg by mouth daily.    Historical Provider, MD  lovastatin (MEVACOR) 10 MG tablet Take 1 tablet (10 mg total) by mouth at bedtime. 08/11/13   Modena Jansky, MD  metoprolol tartrate (LOPRESSOR) 25 MG tablet Take 1 tablet (25 mg total) by mouth 2 (two) times daily. 08/11/13   Modena Jansky, MD  promethazine (PHENERGAN) 25 MG tablet Take 1 tablet (25 mg total) by mouth every 6 (six) hours as needed for nausea or vomiting. 01/19/14   Maudry Diego, MD  ranitidine (ZANTAC) 150 MG capsule Take 1 capsule (150 mg total) by mouth daily. 01/19/14   Maudry Diego, MD  traMADol (ULTRAM) 50 MG tablet Take 1 tablet (50 mg total) by mouth every 6 (six) hours as needed. 01/19/14   Maudry Diego, MD   BP 144/94  Pulse 83  Temp(Src) 98.3 F (36.8 C) (Oral)  Resp 18  Ht 5\' 5"  (1.651 m)  Wt 177 lb (80.287 kg)  BMI 29.45 kg/m2  SpO2 100% Physical Exam  Constitutional: She is oriented to person, place,  and time. She appears well-developed. No distress.  HENT:  Head: Normocephalic and atraumatic.  Mouth/Throat: Oropharynx is clear and moist. No oropharyngeal exudate.  Eyes: Conjunctivae and EOM are normal. Pupils are equal, round, and reactive to light.  Neck: Normal range of motion. Neck supple.  Cardiovascular: Normal rate, regular rhythm and normal heart sounds.   No murmur heard. Pulmonary/Chest: Effort normal. No respiratory distress.  Abdominal: Soft. There is tenderness. There is no rebound and no guarding.  Epigastric and RUQ tenderness  Musculoskeletal: Normal range of motion. She exhibits no edema and no tenderness.   Neurological: She is alert and oriented to person, place, and time. No cranial nerve deficit. She exhibits normal muscle tone. Coordination normal.  Skin: Skin is warm.    ED Course  Procedures (including critical care time) Labs Review Labs Reviewed  CBC WITH DIFFERENTIAL - Abnormal; Notable for the following:    WBC 10.9 (*)    All other components within normal limits  COMPREHENSIVE METABOLIC PANEL - Abnormal; Notable for the following:    Potassium 3.6 (*)    AST 50 (*)    ALT 51 (*)    All other components within normal limits  URINALYSIS, ROUTINE W REFLEX MICROSCOPIC - Abnormal; Notable for the following:    Leukocytes, UA MODERATE (*)    All other components within normal limits  URINE MICROSCOPIC-ADD ON - Abnormal; Notable for the following:    Squamous Epithelial / LPF FEW (*)    All other components within normal limits  LIPASE, BLOOD    Imaging Review US Abdomen Complete  01/19/2014   CLINICAL DATA:  Upper abdominal pain with vomiting ; earlier CT scans suggest gallstones  EXAM: ULTRASOUND ABDOMEN COMPLETE  COMPARISON:  CT ABD - PELV W/ CM dated 01/19/2014  FINDINGS: Gallbladder:  The gallbladder is adequately distended and contains multiple echogenic mobile shadowing stones. The gallbladder wall. Measures under 2 mm in thickness. There is a positive sonographic Murphy's sign.  Common bile duct:  Diameter: 3.8 mm  Liver:  No focal lesion identified. Within normal limits in parenchymal echogenicity.  IVC:  No abnormality visualized.  Pancreas:  Bowel gas limits evaluation of the pancreas.  Spleen:  Size and appearance within normal limits.  Right Kidney:  Length: 12.2 cm. Echogenicity within normal limits. No mass or hydronephrosis visualized.  Left Kidney:  Length: 10.2 cm. Echogenicity within normal limits. No mass or hydronephrosis visualized.  Abdominal aorta:  No aneurysm visualized.  Other findings:  None.  IMPRESSION: 1. There are multiple mobile gallstones present. There  is a positive sonographic Murphy's sign. The findings suggest acute cholecystitis associated with cholelithiasis. 2. The liver and common bile duct and spleen are normal in appearance. The visualized portions of the pancreas are normal but bowel gas severely limits the study.   Electronically Signed   By: David  Martinique   On: 01/19/2014 14:26   Ct Abdomen Pelvis W Contrast  01/19/2014   CLINICAL DATA:  Upper abdominal pain and emesis today.  EXAM: CT ABDOMEN AND PELVIS WITH CONTRAST  TECHNIQUE: Multidetector CT imaging of the abdomen and pelvis was performed using the standard protocol following bolus administration of intravenous contrast.  CONTRAST:  55mL OMNIPAQUE IOHEXOL 300 MG/ML SOLN, 188mL OMNIPAQUE IOHEXOL 300 MG/ML SOLN  COMPARISON:  CT ABD - PELV W/ CM dated 10/02/2013  FINDINGS: Atelectasis in the lung bases. Mild cardiac enlargement. Residual contrast material in the lower esophagus could indicate reflux or dysmotility. No esophageal dilatation.  Cholelithiasis without  inflammatory changes in the gallbladder. The liver, spleen, pancreas, adrenal glands, abdominal aorta, inferior vena cava, and retroperitoneal lymph nodes are unremarkable. 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 Left kidney is unremarkable. Gastric wall is not thickened. Small bowel are decompressed. Stool in the colon without distention. No free air or free fluid in the abdomen.  Pelvis: The appendix is normal. Calcified fibroids in the uterus. No pelvic mass or lymphadenopathy. No diverticulitis. Bladder wall is not thickened. No free or loculated pelvic fluid collections. No destructive bone lesions.  IMPRESSION: No acute process demonstrated in the abdomen or pelvis. Cholelithiasis without evidence of cholecystitis. Calcified uterine fibroids. 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 No change since prior study.   Electronically Signed   By: Lucienne Capers M.D.   On: 01/19/2014 01:27      EKG Interpretation None      MDM   Final diagnoses:  Cholecystitis    Return visit for right upper quadrant pain and epigastric pain. Cholecystitis seen on outpatient ultrasound. Agents abdomen is soft without peritoneal signs.  Labs reviewed from yesterday. Mild LFT elevation but improved from yesterday. White count and lipase normal.  Discussed with Dr. Arnoldo Morale of surgery. He will see patient in office at 10 AM. Patient does not want to be admitted.  Patient agrees to see Dr. Arnoldo Morale tomorrow at 10 AM. She does not want to be admitted. She understands that she is likely to have ongoing pain at home. Dr. Arnoldo Morale does not recommend antibiotics. Return precautions are discussed with patient.  Ezequiel Essex, MD 01/19/14 2215

## 2014-01-19 NOTE — Discharge Instructions (Signed)
Return for your ultrasound tomorrow.  If your ultrasound is ok then follow up with a family md or follow up with DR. Fields

## 2014-01-19 NOTE — ED Notes (Signed)
Pt vomiting large amt clear liquid upon discharge. Pt wants to try and go home. To return 1pm for sono. I encouraged pt to return to ED sooner if worsening pain or intractable vomiting

## 2014-01-19 NOTE — ED Notes (Signed)
Patient arrives via waiting room. Returned this afternoon for ultrasound of gallbladder. Cholecystitis found per MD.

## 2014-01-19 NOTE — Discharge Instructions (Signed)
Cholecystitis  followup with Dr. Arnoldo Morale tomorrow at 10 AM. Return to the ED if you develop new or worsening symptoms. Cholecystitis is an inflammation of your gallbladder. It is usually caused by a buildup of gallstones or sludge (cholelithiasis) in your gallbladder. The gallbladder stores a fluid that helps digest fats (bile). Cholecystitis is serious and needs treatment right away.  CAUSES   Gallstones. Gallstones can block the tube that leads to your gallbladder, causing bile to build up. As bile builds up, the gallbladder becomes inflamed.  Bile duct problems, such as blockage from scarring or kinking.  Tumors. Tumors can stop bile from leaving your gallbladder correctly, causing bile to build up. As bile builds up, the gallbladder becomes inflamed. SYMPTOMS   Nausea.  Vomiting.  Abdominal pain, especially in the upper right area of your abdomen.  Abdominal tenderness or bloating.  Sweating.  Chills.  Fever.  Yellowing of the skin and the whites of the eyes (jaundice). DIAGNOSIS  Your caregiver may order blood tests to look for infection or gallbladder problems. Your caregiver may also order imaging tests, such as an ultrasound or computed tomography (CT) scan. Further tests may include a hepatobiliary iminodiacetic acid (HIDA) scan. This scan allows your caregiver to see your bile move from the liver to the gallbladder and to the small intestine. TREATMENT  A hospital stay is usually necessary to lessen the inflammation of your gallbladder. You may be required to not eat or drink (fast) for a certain amount of time. You may be given medicine to treat pain or an antibiotic medicine to treat an infection. Surgery may be needed to remove your gallbladder (cholecystectomy) once the inflammation has gone down. Surgery may be needed right away if you develop complications such as death of gallbladder tissue (gangrene) or a tear (perforation) of the gallbladder.  [place] care will depend on your treatment. In general:  If you were given antibiotics, take them as directed. Finish them even if you start to feel better.  Only take over-the-counter or prescription medicines for pain, discomfort, or fever as directed by your caregiver.  Follow a low-fat diet until you see your caregiver again.  Keep all follow-up visits as directed by your caregiver. SEEK IMMEDIATE MEDICAL CARE IF:   Your pain is increasing and not controlled by medicines.  Your pain moves to another part of your abdomen or to your back.  You have a fever.  You have nausea and vomiting. MAKE SURE YOU:  Understand these instructions.  Will watch your condition.  Will get help right away if you are not doing well or get worse. Document Released: 08/26/2005 Document Revised: 11/18/2011 Document Reviewed: 07/12/2011 [facility] Patient Information 2014 [place], [place].

## 2014-01-20 ENCOUNTER — Encounter (HOSPITAL_COMMUNITY)
Admission: RE | Admit: 2014-01-20 | Discharge: 2014-01-20 | Disposition: A | Discharge status: Home / Self Care | Payer: BC Managed Care – PPO | Source: Ambulatory Visit | Attending: General Surgery | Admitting: General Surgery

## 2014-01-20 ENCOUNTER — Encounter (HOSPITAL_COMMUNITY): Payer: Self-pay | Admitting: Pharmacy Technician

## 2014-01-20 ENCOUNTER — Encounter (HOSPITAL_COMMUNITY): Payer: Self-pay

## 2014-01-20 HISTORY — DX: Gastro-esophageal reflux disease without esophagitis: K21.9

## 2014-01-20 HISTORY — DX: Pure hypercholesterolemia, unspecified: E78.00

## 2014-01-20 LAB — URINE CULTURE
Colony Count: NO GROWTH
Culture: NO GROWTH

## 2014-01-20 NOTE — H&P (Signed)
  NTS SOAP Note  Vital Signs:  Vitals as of: 6/59/9357: Systolic 017: Diastolic 89: Heart Rate 82: Temp 97.72F: Height 5ft 6in: Weight 176Lbs 0 Ounces: Pain Level 8: BMI 28.41  BMI : 28.41 kg/m2  Subjective: This 53 Years 29 Months old Female presents for of biliary colic.  Was seen in ER this week.  Has been having intermittent right upper quadrant abdoominal pain with radiation to the right flank, nausea, and fatty food intolerance.  No fever, chills, jaundice.  U/S of gallbladder shows cholelithiasis, normal common bile duct.  LFT's improving, normal bilirubin, WBC, lipase.  Review of Symptoms:  Constitutional:unremarkable   Head:unremarkable    Eyes:unremarkable   Nose/Mouth/Throat:unremarkable Cardiovascular:  unremarkable   Respiratory:unremarkable   Gastrointestin    abdominal pain,nausea Genitourinary:unremarkable     Musculoskeletal:unremarkable   Skin:unremarkable Hematolgic/Lymphatic:unremarkable       hay fever   Past Medical History:    Reviewed  Past Medical History  Surgical History: pelvic surgery in remote past for ectopic pregnancy Medical Problems: high cholesterol, HTN, reflux Allergies: nkda Medications: lovastatin, promethazine, tramadol, ranitidine, metoprolol   Social History:Reviewed  Social History  Preferred Language: English Race:  Black or African American Ethnicity: Not Hispanic / Latino Age: 53 Years 54 Months Marital Status:  S   Smoking Status: Never smoker reviewed on 01/20/2014 Functional Status reviewed on 01/20/2014 ------------------------------------------------ Bathing: Normal Cooking: Normal Dressing: Normal Driving: Normal Eating: Normal Managing Meds: Normal Oral Care: Normal Shopping: Normal Toileting: Normal Transferring: Normal Walking: Normal Cognitive Status reviewed on 01/20/2014 ------------------------------------------------ Attention: Normal Decision Making:  Normal Language: Normal Memory: Normal Motor: Normal Perception: Normal Problem Solving: Normal Visual and Spatial: Normal   Family History:  Reviewed  Family Health History Mother, Deceased; Healthy; healthy Father, Living; Asthma;     Objective Information: General:  Well appearing, well nourished in no distress.   no scleral icterus Heart:  RRR, no murmur or gallop.  Normal S1, S2.  No S3, S4.  Lungs:    CTA bilaterally, no wheezes, rhonchi, rales.  Breathing unlabored. Abdomen:Soft, slightly tender in right upper quadrant to palpation, ND, normal bowel sounds, no HSM, no masses.  No peritoneal signs.  Assessment:Cholecystitis, cholelithiasis  Diagnoses: 574.20 Gallstone (Calculus of gallbladder without cholecystitis without obstruction)  Procedures: 79390 - OFFICE OUTPATIENT NEW 30 MINUTES    Plan:  Scheduled for laparoscopic cholecystectomy on 01/21/14.   Patient Education:Alternative treatments to surgery were discussed with patient (and family).  Risks and benefits  of procedure including bleeding, infection, hepatobiliary injury, and the possibility of an open procedure were fully explained to the patient (and family) who gave informed consent. Patient/family questions were addressed.  Follow-up:Pending Surgery

## 2014-01-20 NOTE — Patient Instructions (Addendum)
Your procedure is scheduled on: 01/21/2014  Report to [facility] at 11:00    AM.  Call this number if you have problems the morning of surgery: [phone]   Remember:   Do not drink or eat food:After Midnight.  :  Take these medicines the morning of surgery with A SIP OF WATER: Metoprolol and Ranitidine. You may take your Tramadol and Phenergan if needed.   Do not wear jewelry, make-up or nail polish.  Do not wear lotions, powders, or perfumes. You may wear deodorant.  Do not shave 48 hours prior to surgery. Men may shave face and neck.  Do not bring valuables to the hospital.  Contacts, dentures or bridgework may not be worn into surgery.  Leave suitcase in the car. After surgery it may be brought to your room.  For patients admitted to the hospital, checkout time is 11:00 AM the day of discharge.   Patients discharged the day of surgery will not be allowed to drive home.    Special Instructions: Shower using CHG night before surgery and shower the day of surgery use CHG.  Use special wash - you have one bottle of CHG for all showers.  You should use approximately 1/2 of the bottle for each shower.   Please read over the following fact sheets that you were given: Pain Booklet, MRSA Information, Surgical Site Infection Prevention and Care and Recovery After Surgery  Laparoscopic Cholecystectomy, Care After Refer to this sheet in the next few weeks. These instructions provide you with information on caring for yourself after your procedure. Your health care provider may also give you more specific instructions. Your treatment has been planned according to current medical practices, but problems sometimes occur. Call your health care provider if you have any problems or questions after your procedure. WHAT TO EXPECT AFTER THE PROCEDURE After your procedure, it is typical to have the following:  Pain at your incision sites. You will be given pain medicines to control the pain.  Mild nausea  or vomiting. This should improve after the first 24 hours.  Bloating and possibly shoulder pain from the gas used during the procedure. This will improve after the first 24 hours. HOME CARE INSTRUCTIONS   Change bandages (dressings) as directed by your health care provider.  Keep the wound dry and clean. You may wash the wound gently with soap and water. Gently blot or dab the area dry.  Do not take baths or use swimming pools or hot tubs for 2 weeks or until your health care provider approves.  Only take over-the-counter or prescription medicines as directed by your health care provider.  Continue your normal diet as directed by your health care provider.  Do not lift anything heavier than 10 pounds (4.5 kg) until your health care provider approves.  Do not play contact sports for 1 week or until your health care provider approves. SEEK MEDICAL CARE IF:   You have redness, swelling, or increasing pain in the wound.  You notice yellowish-Reshawn Ostlund fluid (pus) coming from the wound.  You have drainage from the wound that lasts longer than 1 day.  You notice a bad smell coming from the wound or dressing.  Your surgical cuts (incisions) break open. SEEK IMMEDIATE MEDICAL CARE IF:   You develop a rash.  You have difficulty breathing.  You have chest pain.  You have a fever.  You have increasing pain in the shoulders (shoulder strap areas).  You have dizzy episodes or faint  while standing.  You have severe abdominal pain.  You feel sick to your stomach (nauseous) or throw up (vomit) and this lasts for more than 1 day. Document Released: 08/26/2005 Document Revised: 06/16/2013 Document Reviewed: 04/07/2013 [facility] Patient Information 2014 [place]. General Anesthesia, Adult, Care After Refer to this sheet in the next few weeks. These instructions provide you with information on caring for yourself after your procedure. Your health care provider may also give you more  specific instructions. Your treatment has been planned according to current medical practices, but problems sometimes occur. Call your health care provider if you have any problems or questions after your procedure. WHAT TO EXPECT AFTER THE PROCEDURE After the procedure, it is typical to experience:  Sleepiness.  Nausea and vomiting. HOME CARE INSTRUCTIONS  For the first 24 hours after general anesthesia:  Have a responsible person with you.  Do not drive a car. If you are alone, do not take public transportation.  Do not drink alcohol.  Do not take medicine that has not been prescribed by your health care provider.  Do not sign important papers or make important decisions.  You may resume a normal diet and activities as directed by your health care provider.  Change bandages (dressings) as directed.  If you have questions or problems that seem related to general anesthesia, call the hospital and ask for the anesthetist or anesthesiologist on call. SEEK MEDICAL CARE IF:  You have nausea and vomiting that continue the day after anesthesia.  You develop a rash. SEEK IMMEDIATE MEDICAL CARE IF:   You have difficulty breathing.  You have chest pain.  You have any allergic problems. Document Released: 12/02/2000 Document Revised: 04/28/2013 Document Reviewed: 03/11/2013 [facility] Patient Information 2014 [place], [place].

## 2014-01-21 ENCOUNTER — Ambulatory Visit (HOSPITAL_COMMUNITY)
Admission: RE | Admit: 2014-01-21 | Discharge: 2014-01-21 | Disposition: A | Discharge status: Home / Self Care | Payer: BC Managed Care – PPO | Source: Ambulatory Visit | Attending: General Surgery | Admitting: General Surgery

## 2014-01-21 ENCOUNTER — Encounter (HOSPITAL_COMMUNITY): Payer: BC Managed Care – PPO | Admitting: Anesthesiology

## 2014-01-21 ENCOUNTER — Encounter (HOSPITAL_COMMUNITY): Payer: Self-pay | Admitting: *Deleted

## 2014-01-21 ENCOUNTER — Encounter (HOSPITAL_COMMUNITY)
Admission: RE | Disposition: A | Discharge status: Home / Self Care | Payer: Self-pay | Source: Ambulatory Visit | Attending: General Surgery

## 2014-01-21 ENCOUNTER — Ambulatory Visit (HOSPITAL_COMMUNITY): Payer: BC Managed Care – PPO | Admitting: Anesthesiology

## 2014-01-21 DIAGNOSIS — Z79899 Other long term (current) drug therapy: Secondary | ICD-10-CM | POA: Insufficient documentation

## 2014-01-21 DIAGNOSIS — I10 Essential (primary) hypertension: Secondary | ICD-10-CM | POA: Insufficient documentation

## 2014-01-21 DIAGNOSIS — K8010 Calculus of gallbladder with chronic cholecystitis without obstruction: Secondary | ICD-10-CM | POA: Insufficient documentation

## 2014-01-21 DIAGNOSIS — K219 Gastro-esophageal reflux disease without esophagitis: Secondary | ICD-10-CM | POA: Insufficient documentation

## 2014-01-21 HISTORY — PX: CHOLECYSTECTOMY: SHX55

## 2014-01-21 SURGERY — LAPAROSCOPIC CHOLECYSTECTOMY
Anesthesia: General

## 2014-01-21 MED ORDER — ROCURONIUM BROMIDE 100 MG/10ML IV SOLN
INTRAVENOUS | Status: DC | PRN
  Administered 2014-01-21: 10 mg via INTRAVENOUS
  Administered 2014-01-21: 20 mg via INTRAVENOUS

## 2014-01-21 MED ORDER — ONDANSETRON HCL 4 MG/2ML IJ SOLN
INTRAMUSCULAR | Status: AC
  Filled 2014-01-21: qty 2

## 2014-01-21 MED ORDER — CIPROFLOXACIN IN D5W 400 MG/200ML IV SOLN
INTRAVENOUS | Status: AC
  Filled 2014-01-21: qty 200

## 2014-01-21 MED ORDER — ONDANSETRON HCL 4 MG/2ML IJ SOLN
4.0000 mg | Freq: Once | INTRAMUSCULAR | Status: AC | PRN
  Administered 2014-01-21: 4 mg via INTRAVENOUS

## 2014-01-21 MED ORDER — FENTANYL CITRATE 0.05 MG/ML IJ SOLN
INTRAMUSCULAR | Status: DC | PRN
  Administered 2014-01-21 (×2): 50 ug via INTRAVENOUS
  Administered 2014-01-21: 100 ug via INTRAVENOUS
  Administered 2014-01-21: 50 ug via INTRAVENOUS

## 2014-01-21 MED ORDER — BUPIVACAINE HCL (PF) 0.5 % IJ SOLN
INTRAMUSCULAR | Status: DC | PRN
  Administered 2014-01-21: 10 mL

## 2014-01-21 MED ORDER — LACTATED RINGERS IV SOLN
INTRAVENOUS | Status: DC
  Administered 2014-01-21: 11:00:00 via INTRAVENOUS

## 2014-01-21 MED ORDER — FENTANYL CITRATE 0.05 MG/ML IJ SOLN
INTRAMUSCULAR | Status: AC
  Filled 2014-01-21: qty 2

## 2014-01-21 MED ORDER — CHLORHEXIDINE GLUCONATE 4 % EX LIQD: 1.0000 "application " | Freq: Once | CUTANEOUS | Status: DC

## 2014-01-21 MED ORDER — GLYCOPYRROLATE 0.2 MG/ML IJ SOLN
INTRAMUSCULAR | Status: AC
  Filled 2014-01-21: qty 3

## 2014-01-21 MED ORDER — LIDOCAINE HCL (CARDIAC) 20 MG/ML IV SOLN
INTRAVENOUS | Status: DC | PRN
  Administered 2014-01-21: 50 mg via INTRAVENOUS

## 2014-01-21 MED ORDER — CIPROFLOXACIN IN D5W 400 MG/200ML IV SOLN
400.0000 mg | INTRAVENOUS | Status: AC
  Administered 2014-01-21: 400 mg via INTRAVENOUS

## 2014-01-21 MED ORDER — ROCURONIUM BROMIDE 50 MG/5ML IV SOLN
INTRAVENOUS | Status: AC
  Filled 2014-01-21: qty 1

## 2014-01-21 MED ORDER — GLYCOPYRROLATE 0.2 MG/ML IJ SOLN
0.2000 mg | Freq: Once | INTRAMUSCULAR | Status: AC
  Administered 2014-01-21: 0.2 mg via INTRAVENOUS

## 2014-01-21 MED ORDER — MIDAZOLAM HCL 2 MG/2ML IJ SOLN
INTRAMUSCULAR | Status: AC
  Filled 2014-01-21: qty 2

## 2014-01-21 MED ORDER — MIDAZOLAM HCL 2 MG/2ML IJ SOLN
1.0000 mg | INTRAMUSCULAR | Status: DC | PRN
  Administered 2014-01-21 (×2): 2 mg via INTRAVENOUS

## 2014-01-21 MED ORDER — LIDOCAINE HCL (PF) 1 % IJ SOLN
INTRAMUSCULAR | Status: AC
  Filled 2014-01-21: qty 5

## 2014-01-21 MED ORDER — ARTIFICIAL TEARS OP OINT
TOPICAL_OINTMENT | OPHTHALMIC | Status: AC
  Filled 2014-01-21: qty 3.5

## 2014-01-21 MED ORDER — PROPOFOL 10 MG/ML IV BOLUS
INTRAVENOUS | Status: DC | PRN
  Administered 2014-01-21: 150 mg via INTRAVENOUS

## 2014-01-21 MED ORDER — HYDROCODONE-ACETAMINOPHEN 5-325 MG PO TABS: 1.0000 | ORAL_TABLET | Freq: Four times a day (QID) | ORAL | Status: DC | PRN

## 2014-01-21 MED ORDER — ONDANSETRON HCL 4 MG/2ML IJ SOLN
4.0000 mg | Freq: Once | INTRAMUSCULAR | Status: AC
  Administered 2014-01-21: 4 mg via INTRAVENOUS

## 2014-01-21 MED ORDER — FENTANYL CITRATE 0.05 MG/ML IJ SOLN
25.0000 ug | INTRAMUSCULAR | Status: DC | PRN
Start: 2014-01-21 — End: 2014-01-21
  Administered 2014-01-21: 50 ug via INTRAVENOUS
  Administered 2014-01-21 (×2): 25 ug via INTRAVENOUS
  Administered 2014-01-21 (×2): 50 ug via INTRAVENOUS

## 2014-01-21 MED ORDER — BUPIVACAINE HCL (PF) 0.5 % IJ SOLN
INTRAMUSCULAR | Status: AC
  Filled 2014-01-21: qty 30

## 2014-01-21 MED ORDER — NEOSTIGMINE METHYLSULFATE 10 MG/10ML IV SOLN
INTRAVENOUS | Status: DC | PRN
  Administered 2014-01-21: 4 mg via INTRAVENOUS

## 2014-01-21 MED ORDER — SUCCINYLCHOLINE CHLORIDE 20 MG/ML IJ SOLN
INTRAMUSCULAR | Status: AC
  Filled 2014-01-21: qty 1

## 2014-01-21 MED ORDER — SUCCINYLCHOLINE CHLORIDE 20 MG/ML IJ SOLN
INTRAMUSCULAR | Status: DC | PRN
  Administered 2014-01-21: 120 mg via INTRAVENOUS

## 2014-01-21 MED ORDER — KETOROLAC TROMETHAMINE 30 MG/ML IJ SOLN
30.0000 mg | Freq: Once | INTRAMUSCULAR | Status: AC
  Administered 2014-01-21: 30 mg via INTRAVENOUS
  Filled 2014-01-21: qty 1

## 2014-01-21 MED ORDER — HEMOSTATIC AGENTS (NO CHARGE) OPTIME
TOPICAL | Status: DC | PRN
  Administered 2014-01-21: 1

## 2014-01-21 MED ORDER — GLYCOPYRROLATE 0.2 MG/ML IJ SOLN
INTRAMUSCULAR | Status: DC | PRN
  Administered 2014-01-21: 0.6 mg via INTRAVENOUS

## 2014-01-21 MED ORDER — GLYCOPYRROLATE 0.2 MG/ML IJ SOLN
INTRAMUSCULAR | Status: AC
  Filled 2014-01-21: qty 1

## 2014-01-21 MED ORDER — LACTATED RINGERS IV SOLN
INTRAVENOUS | Status: DC | PRN
  Administered 2014-01-21 (×2): via INTRAVENOUS

## 2014-01-21 MED ORDER — PROPOFOL 10 MG/ML IV BOLUS
INTRAVENOUS | Status: AC
  Filled 2014-01-21: qty 20

## 2014-01-21 MED ORDER — POVIDONE-IODINE 10 % EX OINT
TOPICAL_OINTMENT | CUTANEOUS | Status: AC
  Filled 2014-01-21: qty 1

## 2014-01-21 MED ORDER — FENTANYL CITRATE 0.05 MG/ML IJ SOLN
INTRAMUSCULAR | Status: AC
  Filled 2014-01-21: qty 5

## 2014-01-21 MED ORDER — POVIDONE-IODINE 10 % OINT PACKET
TOPICAL_OINTMENT | CUTANEOUS | Status: DC | PRN
  Administered 2014-01-21: 1 via TOPICAL

## 2014-01-21 MED ORDER — SODIUM CHLORIDE 0.9 % IR SOLN
Status: DC | PRN
  Administered 2014-01-21: 1000 mL

## 2014-01-21 SURGICAL SUPPLY — 40 items
APPLIER CLIP LAPSCP 10X32 DD (CLIP) ×3 IMPLANT
BAG HAMPER (MISCELLANEOUS) ×3 IMPLANT
BAG SPEC RTRVL LRG 6X4 10 (ENDOMECHANICALS) ×1
CLOTH BEACON ORANGE TIMEOUT ST (SAFETY) ×3 IMPLANT
COVER LIGHT HANDLE STERIS (MISCELLANEOUS) ×6 IMPLANT
DECANTER SPIKE VIAL GLASS SM (MISCELLANEOUS) ×3 IMPLANT
DURAPREP 26ML APPLICATOR (WOUND CARE) ×3 IMPLANT
ELECT REM PT RETURN 9FT ADLT (ELECTROSURGICAL) ×3
ELECTRODE REM PT RTRN 9FT ADLT (ELECTROSURGICAL) ×1 IMPLANT
FILTER SMOKE EVAC LAPAROSHD (FILTER) ×3 IMPLANT
FORMALIN 10 PREFIL 120ML (MISCELLANEOUS) ×3 IMPLANT
GLOVE BIOGEL PI IND STRL 7.0 (GLOVE) IMPLANT
GLOVE BIOGEL PI INDICATOR 7.0 (GLOVE) ×6
GLOVE ECLIPSE 6.5 STRL STRAW (GLOVE) ×3 IMPLANT
GLOVE ECLIPSE 7.0 STRL STRAW (GLOVE) ×2 IMPLANT
GLOVE SURG SS PI 7.5 STRL IVOR (GLOVE) ×3 IMPLANT
GOWN STRL REUS W/TWL LRG LVL3 (GOWN DISPOSABLE) ×9 IMPLANT
HEMOSTAT SNOW SURGICEL 2X4 (HEMOSTASIS) ×3 IMPLANT
INST SET LAPROSCOPIC AP (KITS) ×3 IMPLANT
KIT ROOM TURNOVER APOR (KITS) ×3 IMPLANT
MANIFOLD NEPTUNE II (INSTRUMENTS) ×3 IMPLANT
NDL INSUFFLATION 14GA 120MM (NEEDLE) ×1 IMPLANT
NEEDLE INSUFFLATION 14GA 120MM (NEEDLE) ×3 IMPLANT
NS IRRIG 1000ML POUR BTL (IV SOLUTION) ×3 IMPLANT
PACK LAP CHOLE LZT030E (CUSTOM PROCEDURE TRAY) ×3 IMPLANT
PAD ARMBOARD 7.5X6 YLW CONV (MISCELLANEOUS) ×3 IMPLANT
POUCH SPECIMEN RETRIEVAL 10MM (ENDOMECHANICALS) ×3 IMPLANT
SET BASIN LINEN APH (SET/KITS/TRAYS/PACK) ×3 IMPLANT
SLEEVE ENDOPATH XCEL 5M (ENDOMECHANICALS) ×3 IMPLANT
SPONGE GAUZE 2X2 8PLY STER LF (GAUZE/BANDAGES/DRESSINGS) ×4
SPONGE GAUZE 2X2 8PLY STRL LF (GAUZE/BANDAGES/DRESSINGS) ×8 IMPLANT
STAPLER VISISTAT (STAPLE) ×3 IMPLANT
SUT VICRYL 0 UR6 27IN ABS (SUTURE) ×3 IMPLANT
TAPE CLOTH SURG 4X10 WHT LF (GAUZE/BANDAGES/DRESSINGS) ×3 IMPLANT
TROCAR ENDO BLADELESS 11MM (ENDOMECHANICALS) ×3 IMPLANT
TROCAR XCEL NON-BLD 5MMX100MML (ENDOMECHANICALS) ×3 IMPLANT
TROCAR XCEL UNIV SLVE 11M 100M (ENDOMECHANICALS) ×3 IMPLANT
TUBING INSUFFLATION (TUBING) ×3 IMPLANT
WARMER LAPAROSCOPE (MISCELLANEOUS) ×3 IMPLANT
YANKAUER SUCT 12FT TUBE ARGYLE (SUCTIONS) ×3 IMPLANT

## 2014-01-21 NOTE — Discharge Instructions (Signed)
Laparoscopic Cholecystectomy, Care After °Refer to this sheet in the next few weeks. These instructions provide you with information on caring for yourself after your procedure. Your health care provider may also give you more specific instructions. Your treatment has been planned according to current medical practices, but problems sometimes occur. Call your health care provider if you have any problems or questions after your procedure. °WHAT TO EXPECT AFTER THE PROCEDURE °After your procedure, it is typical to have the following: °· Pain at your incision sites. You will be given pain medicines to control the pain. °· Mild nausea or vomiting. This should improve after the first 24 hours. °· Bloating and possibly shoulder pain from the gas used during the procedure. This will improve after the first 24 hours. °HOME CARE INSTRUCTIONS  °· Change bandages (dressings) as directed by your health care provider. °· Keep the wound dry and clean. You may wash the wound gently with soap and water. Gently blot or dab the area dry. °· Do not take baths or use swimming pools or hot tubs for 2 weeks or until your health care provider approves. °· Only take over-the-counter or prescription medicines as directed by your health care provider. °· Continue your normal diet as directed by your health care provider. °· Do not lift anything heavier than 10 pounds (4.5 kg) until your health care provider approves. °· Do not play contact sports for 1 week or until your health care provider approves. °SEEK MEDICAL CARE IF:  °· You have redness, swelling, or increasing pain in the wound. °· You notice yellowish-white fluid (pus) coming from the wound. °· You have drainage from the wound that lasts longer than 1 day. °· You notice a bad smell coming from the wound or dressing. °· Your surgical cuts (incisions) break open. °SEEK IMMEDIATE MEDICAL CARE IF:  °· You develop a rash. °· You have difficulty breathing. °· You have chest pain. °· You  have a fever. °· You have increasing pain in the shoulders (shoulder strap areas). °· You have dizzy episodes or faint while standing. °· You have severe abdominal pain. °· You feel sick to your stomach (nauseous) or throw up (vomit) and this lasts for more than 1 day. °Document Released: 08/26/2005 Document Revised: 06/16/2013 Document Reviewed: 04/07/2013 °[employer]® Patient Information ©2014 [employer]. ° °

## 2014-01-21 NOTE — Anesthesia Postprocedure Evaluation (Signed)
  Anesthesia Post-op Note  Patient: Kendra Valenzuela  Procedure(s) Performed: Procedure(s): LAPAROSCOPIC CHOLECYSTECTOMY (N/A)  Patient Location: PACU  Anesthesia Type:General  Level of Consciousness: awake, alert , oriented and patient cooperative  Airway and Oxygen Therapy: Patient Spontanous Breathing and Patient connected to face mask oxygen  Post-op Pain: none  Post-op Assessment: Post-op Vital signs reviewed, Patient's Cardiovascular Status Stable, Respiratory Function Stable, Patent Airway, No signs of Nausea or vomiting and Pain level controlled  Post-op Vital Signs: Reviewed and stable  Last Vitals:  Filed Vitals:   01/21/14 1245  BP: 138/81  Pulse: 80  Temp: 36.4 C  Resp: 14    Complications: No apparent anesthesia complications

## 2014-01-21 NOTE — Anesthesia Procedure Notes (Signed)
Procedure Name: Intubation Date/Time: 01/21/2014 12:03 PM Performed by: Andree Elk, Sheehan Stacey A Pre-anesthesia Checklist: Patient identified, Patient being monitored, Timeout performed, Emergency Drugs available and Suction available Patient Re-evaluated:Patient Re-evaluated prior to inductionOxygen Delivery Method: Circle System Utilized Preoxygenation: Pre-oxygenation with 100% oxygen Intubation Type: IV induction Ventilation: Mask ventilation without difficulty Laryngoscope Size: 3 and Miller Grade View: Grade I Tube type: Oral Tube size: 7.0 mm Number of attempts: 1 Airway Equipment and Method: stylet Placement Confirmation: ETT inserted through vocal cords under direct vision,  positive ETCO2 and breath sounds checked- equal and bilateral Secured at: 21 cm Tube secured with: Tape Dental Injury: Teeth and Oropharynx as per pre-operative assessment

## 2014-01-21 NOTE — Transfer of Care (Signed)
Immediate Anesthesia Transfer of Care Note  Patient: Kendra Valenzuela  Procedure(s) Performed: Procedure(s): LAPAROSCOPIC CHOLECYSTECTOMY (N/A)  Patient Location: PACU  Anesthesia Type:General  Level of Consciousness: awake, alert , oriented and patient cooperative  Airway & Oxygen Therapy: Patient Spontanous Breathing and Patient connected to face mask oxygen  Post-op Assessment: Report given to PACU RN and Post -op Vital signs reviewed and stable  Post vital signs: Reviewed and stable  Complications: No apparent anesthesia complications

## 2014-01-21 NOTE — Interval H&P Note (Signed)
History and Physical Interval Note:  01/21/2014 11:46 AM  Kendra Valenzuela  has presented today for surgery, with the diagnosis of cholelithiasis  The various methods of treatment have been discussed with the patient and family. After consideration of risks, benefits and other options for treatment, the patient has consented to  Procedure(s): LAPAROSCOPIC CHOLECYSTECTOMY (N/A) as a surgical intervention .  The patient's history has been reviewed, patient examined, no change in status, stable for surgery.  I have reviewed the patient's chart and labs.  Questions were answered to the patient's satisfaction.     Jamesetta So

## 2014-01-21 NOTE — Op Note (Signed)
Patient:  Kendra Valenzuela  DOB:  1960/10/25  MRN:  [id number]   Preop Diagnosis:  Cholecystitis, cholelithiasis  Postop Diagnosis:  Same  Procedure:  Laparoscopic cholecystectomy  Surgeon:  Aviva Signs, M.D.  Anes:  General endotracheal  Indications:  Patient is a 53 year old black female presents with cholecystitis secondary to cholelithiasis. The risks and benefits of the procedure including bleeding, infection, hepatobiliary, and the possibility of an open procedure were fully explained to the patient, who gave informed consent.  Procedure note:  The patient is placed the supine position. After induction of general endotracheal anesthesia, the abdomen was prepped and draped using usual sterile technique with DuraPrep. Surgical site confirmation was performed.  A supraumbilical incision was made down to the fascia. A Veress needle was introduced into the abdominal cavity and confirmation of placement was done using the saline drop test. The abdomen was then insufflated to 16 mm mercury pressure. An 11 mm trocar was introduced into the abdominal cavity under direct visualization without difficulty. The patient was placed in reverse Trendelenburg position and additional 11 mm trocar was placed the epigastric region and 5 mm trochars were placed the right upper quadrant and right flank regions. The liver was inspected and had some Fitz-Hugh Curtis adhesions present. These were lysed with Endo Shears without difficulty. The gallbladder was retracted in a dynamic fashion in order to expose the triangle of Calot. The cystic duct was first identified. Its junction to the infundibulum was fully identified. Endoclips were placed proximally and distally on the cystic duct, and the cystic duct was divided. This was likewise done to the cystic artery. The gallbladder was then freed away from the gallbladder fossa using Bovie electrocautery. The gallbladder was delivered through the epigastric are site  using an Endo Catch bag. The gallbladder fossa was inspected no abnormal bleeding or bile leakage was noted. Surgicel is placed the gallbladder fossa. All fluid and air were then evacuated from the abdominal cavity prior to removal of the trochars.  All wounds were irrigated with normal saline. All wounds were injected with 0.5% Sensorcaine. The skin incisions were closed using staples. Betadine ointment and dry sterile dressings were applied.  All tape and needle counts were correct at the end of the procedure. Patient was extubated in the operating room and transferred to PACU in stable condition.  Complications:  None  EBL:  Minimal  Specimen:  Gallbladder

## 2014-01-21 NOTE — Anesthesia Preprocedure Evaluation (Addendum)
Anesthesia Evaluation  Patient identified by MRN, date of birth, ID band Patient awake    Reviewed: Allergy & Precautions, H&P , NPO status , Patient's Chart, lab work & pertinent test results, reviewed documented beta blocker date and time   Airway Mallampati: I TM Distance: >3 FB Neck ROM: Full    Dental  (+) Teeth Intact   Pulmonary  breath sounds clear to auscultation        Cardiovascular hypertension, Pt. on medications and Pt. on home beta blockers Rhythm:Regular Rate:Normal     Neuro/Psych  Headaches, Hx  encephalopathy 2014    GI/Hepatic GERD-  Medicated and Controlled,Elevated LFT's    Endo/Other    Renal/GU      Musculoskeletal   Abdominal   Peds  Hematology  (+) anemia ,   Anesthesia Other Findings   Reproductive/Obstetrics                          Anesthesia Physical Anesthesia Plan  ASA: III  Anesthesia Plan: General   Post-op Pain Management:    Induction: Intravenous, Rapid sequence and Cricoid pressure planned  Airway Management Planned: Oral ETT  Additional Equipment:   Intra-op Plan:   Post-operative Plan: Extubation in OR  Informed Consent: I have reviewed the patients History and Physical, chart, labs and discussed the procedure including the risks, benefits and alternatives for the proposed anesthesia with the patient or authorized representative who has indicated his/her understanding and acceptance.     Plan Discussed with:   Anesthesia Plan Comments:         Anesthesia Quick Evaluation

## 2014-01-24 ENCOUNTER — Encounter (HOSPITAL_COMMUNITY): Payer: Self-pay | Admitting: General Surgery

## 2014-05-22 ENCOUNTER — Encounter (HOSPITAL_COMMUNITY): Payer: Self-pay | Admitting: Emergency Medicine

## 2014-05-22 ENCOUNTER — Emergency Department (HOSPITAL_COMMUNITY)
Admission: EM | Admit: 2014-05-22 | Discharge: 2014-05-22 | Disposition: A | Discharge status: Home / Self Care | Payer: BC Managed Care – PPO | Attending: Emergency Medicine | Admitting: Emergency Medicine

## 2014-05-22 ENCOUNTER — Emergency Department (HOSPITAL_COMMUNITY): Payer: BC Managed Care – PPO

## 2014-05-22 DIAGNOSIS — Z23 Encounter for immunization: Secondary | ICD-10-CM | POA: Diagnosis not present

## 2014-05-22 DIAGNOSIS — Z7982 Long term (current) use of aspirin: Secondary | ICD-10-CM | POA: Diagnosis not present

## 2014-05-22 DIAGNOSIS — Y93E9 Activity, other interior property and clothing maintenance: Secondary | ICD-10-CM | POA: Insufficient documentation

## 2014-05-22 DIAGNOSIS — E7800 Pure hypercholesterolemia, unspecified: Secondary | ICD-10-CM | POA: Diagnosis not present

## 2014-05-22 DIAGNOSIS — IMO0002 Reserved for concepts with insufficient information to code with codable children: Secondary | ICD-10-CM

## 2014-05-22 DIAGNOSIS — Y990 Civilian activity done for income or pay: Secondary | ICD-10-CM | POA: Insufficient documentation

## 2014-05-22 DIAGNOSIS — Z79899 Other long term (current) drug therapy: Secondary | ICD-10-CM | POA: Insufficient documentation

## 2014-05-22 DIAGNOSIS — K219 Gastro-esophageal reflux disease without esophagitis: Secondary | ICD-10-CM | POA: Diagnosis not present

## 2014-05-22 DIAGNOSIS — Y9289 Other specified places as the place of occurrence of the external cause: Secondary | ICD-10-CM | POA: Diagnosis not present

## 2014-05-22 DIAGNOSIS — S61209A Unspecified open wound of unspecified finger without damage to nail, initial encounter: Secondary | ICD-10-CM | POA: Insufficient documentation

## 2014-05-22 DIAGNOSIS — Z9889 Other specified postprocedural states: Secondary | ICD-10-CM | POA: Diagnosis not present

## 2014-05-22 DIAGNOSIS — W268XXA Contact with other sharp object(s), not elsewhere classified, initial encounter: Secondary | ICD-10-CM | POA: Insufficient documentation

## 2014-05-22 DIAGNOSIS — I10 Essential (primary) hypertension: Secondary | ICD-10-CM | POA: Diagnosis not present

## 2014-05-22 DIAGNOSIS — Z791 Long term (current) use of non-steroidal anti-inflammatories (NSAID): Secondary | ICD-10-CM | POA: Diagnosis not present

## 2014-05-22 MED ORDER — TETANUS-DIPHTH-ACELL PERTUSSIS 5-2.5-18.5 LF-MCG/0.5 IM SUSP
0.5000 mL | Freq: Once | INTRAMUSCULAR | Status: AC
  Administered 2014-05-22: 0.5 mL via INTRAMUSCULAR
  Filled 2014-05-22: qty 0.5

## 2014-05-22 MED ORDER — POVIDONE-IODINE 10 % EX SOLN
CUTANEOUS | Status: AC
  Administered 2014-05-22: 15:00:00
  Filled 2014-05-22: qty 118

## 2014-05-22 MED ORDER — LIDOCAINE HCL (PF) 2 % IJ SOLN
2.0000 mL | Freq: Once | INTRAMUSCULAR | Status: AC
  Administered 2014-05-22: 2 mL
  Filled 2014-05-22: qty 10

## 2014-05-22 NOTE — ED Notes (Signed)
Julie PA at bedside,  

## 2014-05-22 NOTE — ED Notes (Signed)
Almyra Free PA at bedside suturing,

## 2014-05-22 NOTE — ED Notes (Signed)
Finger splint placed on right hand, right index finger, hand dressed with telfa, 4X4's, kling, pt tolerated well,

## 2014-05-22 NOTE — ED Provider Notes (Signed)
CSN: [id number]     Arrival date & time 05/22/14  1304 History  This chart was scribed for non-physician practitioner, Evalee Jefferson, PA-C,working with Virgel Manifold, MD, by Marlowe Kays, ED Scribe. This patient was seen in room [facility] and the patient's care was started at 3:03 PM.  Chief Complaint  Patient presents with  . Extremity Laceration   The history is provided by the patient. No language interpreter was used.   HPI Comments:  Kendra Valenzuela is a 53 y.o. female who presents to the Emergency Department complaining of a laceration to the base of her index finger on the dorsal aspect that she sustained washing a glass at work about 3 hours ago. She reports associated decreased sensation to the lateral side of the index finger. She reports associated bleeding which is now controlled. She has wrapped the wound in gauze and applied pressure. She denies loss of ROM of the finger or weakness of the finger. She is unaware of her last tetanus vaccination. Dr. Cindie Laroche is her PCP.  Past Medical History  Diagnosis Date  . Environmental allergies   . Back pain   . Chest pain 01/2013    Admitted to [facility] in 01/2013  . Hypertension 01/13/2013  . Headache(784.0)   . Hypercholesteremia   . GERD (gastroesophageal reflux disease)    Past Surgical History  Procedure Laterality Date  . Dilation and curettage of uterus    . Cardiac catheterization    . Unilateral salpingectomy Right     due to ectopic pregnancy  . Cholecystectomy N/A 01/21/2014    Procedure: LAPAROSCOPIC CHOLECYSTECTOMY;  Surgeon: Jamesetta So, MD;  Location: [facility];  Service: General;  Laterality: N/A;   Family History  Problem Relation Age of Onset  . Hypertension Father    History  Substance Use Topics  . Smoking status: Never Smoker   . Smokeless tobacco: Never Used  . Alcohol Use: 8.4 oz/week    14 Cans of beer per week     Comment: 2 cans of beer daily    OB History   Grav Para Term Preterm Abortions TAB  SAB Ect Mult Living                 Review of Systems  Constitutional: Negative for fever.  HENT: Negative for congestion and sore throat.   Eyes: Negative.   Respiratory: Negative for chest tightness and shortness of breath.   Cardiovascular: Negative for chest pain.  Gastrointestinal: Negative for nausea and abdominal pain.  Genitourinary: Negative.   Musculoskeletal: Negative for arthralgias, joint swelling and neck pain.  Skin: Positive for wound. Negative for rash.  Neurological: Negative for dizziness, weakness, light-headedness and headaches.  Psychiatric/Behavioral: Negative.     Allergies  Review of patient's allergies indicates no known allergies.  Home Medications   Prior to Admission medications   Medication Sig Start Date End Date Taking? Authorizing Provider  acetaminophen (TYLENOL) 500 MG tablet Take 1,000 mg by mouth every 6 (six) hours as needed for pain.   Yes Historical Provider, MD  aspirin EC 81 MG tablet Take 81 mg by mouth daily.   Yes Historical Provider, MD  diphenhydrAMINE (BENADRYL) 25 MG tablet Take 50 mg by mouth 2 (two) times daily as needed for allergies.   Yes Historical Provider, MD  HYDROcodone-acetaminophen (NORCO/VICODIN) 5-325 MG per tablet Take 1-2 tablets by mouth every 6 (six) hours as needed for moderate pain. 01/21/14 01/21/15 Yes Jamesetta So, MD  lisinopril-hydrochlorothiazide (PRINZIDE,ZESTORETIC) 20-12.5 MG  per tablet Take 1 tablet by mouth daily. 04/26/14  Yes Historical Provider, MD  lovastatin (MEVACOR) 10 MG tablet Take 1 tablet (10 mg total) by mouth at bedtime. 08/11/13  Yes Modena Jansky, MD  naproxen (NAPROSYN) 500 MG tablet Take 1 tablet by mouth 2 (two) times daily. 04/26/14  Yes Historical Provider, MD  pravastatin (PRAVACHOL) 40 MG tablet Take 1 tablet by mouth daily. 04/26/14  Yes Historical Provider, MD   Triage Vitals: BP 146/85  Pulse 100  Temp(Src) 98.7 F (37.1 C) (Oral)  Resp 18  Ht 5\' 6"  (1.676 m)  Wt 183 lb  (83.008 kg)  BMI 29.55 kg/m2  SpO2 99% Physical Exam  Nursing note and vitals reviewed. Constitutional: She appears well-developed and well-nourished.  HENT:  Head: Normocephalic and atraumatic.  Eyes: Conjunctivae are normal.  Neck: Normal range of motion.  Cardiovascular: Normal rate.   Pulmonary/Chest: Effort normal.  Musculoskeletal: Normal range of motion.  Full ROM of the right index finger including resisted flexion and extension. Decreased sensation to fine touch on lateral aspect of finger.  Neurological: She is alert.  Skin: Skin is warm and dry.  2 cm subcutaneous, hemastatic laceration to the dorsal right hand at base of index finger.  Psychiatric: She has a normal mood and affect.    ED Course  Procedures (including critical care time) DIAGNOSTIC STUDIES: Oxygen Saturation is 99% on RA, normal by my interpretation.   COORDINATION OF CARE: 3:06 PM- Will update tetanus vaccination and suture laceration. Pt verbalizes understanding and agrees to plan.  LACERATION REPAIR PROCEDURE NOTE The patient's identification was confirmed and consent was obtained. This procedure was performed by Evalee Jefferson, PA-C at 3:27 PM. Site: dorsal aspect of base of right index finger Sterile procedures observed Anesthetic used (type and amt): Lidocaine 2% without Epinephrine (1.5 mLs) Suture type/size: 4-0 Prolene Length: 2 cm # of Sutures: 5 Technique: simple, interrupted Complexity: simple Tetanus ordered Site anesthetized, irrigated with NS, explored without evidence of foreign body, wound well approximated, site covered with dry, sterile dressing.  Patient tolerated procedure well without complications. Instructions for care discussed verbally and patient provided with additional written instructions for homecare and f/u.   Medications  lidocaine (XYLOCAINE) 2 % injection 2 mL (2 mLs Other Given by Other 05/22/14 1538)  Tdap (BOOSTRIX) injection 0.5 mL (0.5 mLs Intramuscular  Given 05/22/14 1518)  povidone-iodine (BETADINE) 10 % external solution (  Given 05/22/14 1519)    Labs Review Labs Reviewed - No data to display  Imaging Review No results found.   EKG Interpretation None      MDM   Final diagnoses:  Laceration    Wound care instructions given.  Pt advised to have sutures removed in 10 days,  Return here sooner for any signs of infection including redness, swelling, worse pain or drainage of pus.  Tetanus given.  The patient appears reasonably screened and/or stabilized for discharge and I doubt any other medical condition or other [facility] requiring further screening, evaluation, or treatment in the ED at this time prior to discharge.    I personally performed the services described in this documentation, which was scribed in my presence. The recorded information has been reviewed and is accurate.    Evalee Jefferson, PA-C 05/24/14 2240

## 2014-05-22 NOTE — ED Notes (Signed)
Pt states that a glass broke while she was washing dishes, has laceration noted to knuckle on right hand, bleeding controlled at present, cms intact distal

## 2014-05-22 NOTE — ED Notes (Signed)
Pt with lac to right hand while washing dishes at work, glass had broken

## 2014-05-22 NOTE — Discharge Instructions (Signed)
Laceration Care, Adult °A laceration is a cut or lesion that goes through all layers of the skin and into the tissue just beneath the skin. °TREATMENT  °Some lacerations may not require closure. Some lacerations may not be able to be closed due to an increased risk of infection. It is important to see your caregiver as soon as possible after an injury to minimize the risk of infection and maximize the opportunity for successful closure. °If closure is appropriate, pain medicines may be given, if needed. The wound will be cleaned to help prevent infection. Your caregiver will use stitches (sutures), staples, wound glue (adhesive), or skin adhesive strips to repair the laceration. These tools bring the skin edges together to allow for faster healing and a better cosmetic outcome. However, all wounds will heal with a scar. Once the wound has healed, scarring can be minimized by covering the wound with sunscreen during the day for 1 full year. °HOME CARE INSTRUCTIONS  °For sutures or staples: °· Keep the wound clean and dry. °· If you were given a bandage (dressing), you should change it at least once a day. Also, change the dressing if it becomes wet or dirty, or as directed by your caregiver. °· Wash the wound with soap and water 2 times a day. Rinse the wound off with water to remove all soap. Pat the wound dry with a clean towel. °· After cleaning, apply a thin layer of the antibiotic ointment as recommended by your caregiver. This will help prevent infection and keep the dressing from sticking. °· You may shower as usual after the first 24 hours. Do not soak the wound in water until the sutures are removed. °· Only take over-the-counter or prescription medicines for pain, discomfort, or fever as directed by your caregiver. °· Get your sutures or staples removed as directed by your caregiver. °For skin adhesive strips: °· Keep the wound clean and dry. °· Do not get the skin adhesive strips wet. You may bathe  carefully, using caution to keep the wound dry. °· If the wound gets wet, pat it dry with a clean towel. °· Skin adhesive strips will fall off on their own. You may trim the strips as the wound heals. Do not remove skin adhesive strips that are still stuck to the wound. They will fall off in time. °For wound adhesive: °· You may briefly wet your wound in the shower or bath. Do not soak or scrub the wound. Do not swim. Avoid periods of heavy perspiration until the skin adhesive has fallen off on its own. After showering or bathing, gently pat the wound dry with a clean towel. °· Do not apply liquid medicine, cream medicine, or ointment medicine to your wound while the skin adhesive is in place. This may loosen the film before your wound is healed. °· If a dressing is placed over the wound, be careful not to apply tape directly over the skin adhesive. This may cause the adhesive to be pulled off before the wound is healed. °· Avoid prolonged exposure to sunlight or tanning lamps while the skin adhesive is in place. Exposure to ultraviolet light in the first year will darken the scar. °· The skin adhesive will usually remain in place for 5 to 10 days, then naturally fall off the skin. Do not pick at the adhesive film. °You may need a tetanus shot if: °· You cannot remember when you had your last tetanus shot. °· You have never had a tetanus   shot. If you get a tetanus shot, your arm may swell, get red, and feel warm to the touch. This is common and not a problem. If you need a tetanus shot and you choose not to have one, there is a rare chance of getting tetanus. Sickness from tetanus can be serious. SEEK MEDICAL CARE IF:   You have redness, swelling, or increasing pain in the wound.  You see a red line that goes away from the wound.  You have yellowish-white fluid (pus) coming from the wound.  You have a fever.  You notice a bad smell coming from the wound or dressing.  Your wound breaks open before or  after sutures have been removed.  You notice something coming out of the wound such as wood or glass.  Your wound is on your hand or foot and you cannot move a finger or toe. SEEK IMMEDIATE MEDICAL CARE IF:   Your pain is not controlled with prescribed medicine.  You have severe swelling around the wound causing pain and numbness or a change in color in your arm, hand, leg, or foot.  Your wound splits open and starts bleeding.  You have worsening numbness, weakness, or loss of function of any joint around or beyond the wound.  You develop painful lumps near the wound or on the skin anywhere on your body. MAKE SURE YOU:   Understand these instructions.  Will watch your condition.  Will get help right away if you are not doing well or get worse. Document Released: 08/26/2005 Document Revised: 11/18/2011 Document Reviewed: 02/19/2011 [facility] Patient Information 2015 [place], [place]. This information is not intended to replace advice given to you by your health care provider. Make sure you discuss any questions you have with your health care provider.   Have your sutures removed in 10 days.  Keep your wound clean and dry, until a good scab forms - you may then wash gently twice daily with mild soap and water, but dry completely after.  Get rechecked for any sign of infection (redness,  Swelling,  Increased pain or drainage of purulent fluid).

## 2014-05-25 NOTE — ED Provider Notes (Signed)
Medical screening examination/treatment/procedure(s) were performed by non-physician practitioner and as supervising physician I was immediately available for consultation/collaboration.   EKG Interpretation None       Virgel Manifold, MD 05/25/14 1236

## 2014-06-01 ENCOUNTER — Encounter (HOSPITAL_COMMUNITY): Payer: Self-pay | Admitting: Emergency Medicine

## 2014-06-01 ENCOUNTER — Emergency Department (HOSPITAL_COMMUNITY)
Admission: EM | Admit: 2014-06-01 | Discharge: 2014-06-01 | Disposition: A | Discharge status: Home / Self Care | Payer: BC Managed Care – PPO | Attending: Emergency Medicine | Admitting: Emergency Medicine

## 2014-06-01 DIAGNOSIS — Z7982 Long term (current) use of aspirin: Secondary | ICD-10-CM | POA: Insufficient documentation

## 2014-06-01 DIAGNOSIS — Z79899 Other long term (current) drug therapy: Secondary | ICD-10-CM | POA: Insufficient documentation

## 2014-06-01 DIAGNOSIS — E7800 Pure hypercholesterolemia, unspecified: Secondary | ICD-10-CM | POA: Diagnosis not present

## 2014-06-01 DIAGNOSIS — Z4802 Encounter for removal of sutures: Secondary | ICD-10-CM | POA: Diagnosis not present

## 2014-06-01 DIAGNOSIS — I10 Essential (primary) hypertension: Secondary | ICD-10-CM | POA: Insufficient documentation

## 2014-06-01 NOTE — ED Provider Notes (Signed)
CSN: [id number]     Arrival date & time 06/01/14  1131 History   First MD Initiated Contact with Patient 06/01/14 1202     Chief Complaint  Patient presents with  . Suture / Staple Removal     (Consider location/radiation/quality/duration/timing/severity/associated sxs/prior Treatment) Patient is a 53 y.o. female presenting with suture removal. The history is provided by the patient. No language interpreter was used.  Suture / Staple Removal Pertinent negatives include no fever or numbness. Associated symptoms comments: Sutures placed to right hand 05/22/14, here for removal. No interim complications..    Past Medical History  Diagnosis Date  . Environmental allergies   . Back pain   . Chest pain 01/2013    Admitted to [facility] in 01/2013  . Hypertension 01/13/2013  . Headache(784.0)   . Hypercholesteremia   . GERD (gastroesophageal reflux disease)    Past Surgical History  Procedure Laterality Date  . Dilation and curettage of uterus    . Cardiac catheterization    . Unilateral salpingectomy Right     due to ectopic pregnancy  . Cholecystectomy N/A 01/21/2014    Procedure: LAPAROSCOPIC CHOLECYSTECTOMY;  Surgeon: Jamesetta So, MD;  Location: [facility];  Service: General;  Laterality: N/A;   Family History  Problem Relation Age of Onset  . Hypertension Father    History  Substance Use Topics  . Smoking status: Never Smoker   . Smokeless tobacco: Never Used  . Alcohol Use: 8.4 oz/week    14 Cans of beer per week     Comment: 2 cans of beer daily    OB History   Grav Para Term Preterm Abortions TAB SAB Ect Mult Living                 Review of Systems  Constitutional: Negative for fever.  Musculoskeletal:       No pain or swelling or site.  Skin: Negative for color change.  Neurological: Negative for numbness.      Allergies  Review of patient's allergies indicates no known allergies.  Home Medications   Prior to Admission medications   Medication Sig Start Date  End Date Taking? Authorizing Provider  acetaminophen (TYLENOL) 500 MG tablet Take 1,000 mg by mouth every 6 (six) hours as needed for pain.    Historical Provider, MD  aspirin EC 81 MG tablet Take 81 mg by mouth daily.    Historical Provider, MD  diphenhydrAMINE (BENADRYL) 25 MG tablet Take 50 mg by mouth 2 (two) times daily as needed for allergies.    Historical Provider, MD  HYDROcodone-acetaminophen (NORCO/VICODIN) 5-325 MG per tablet Take 1-2 tablets by mouth every 6 (six) hours as needed for moderate pain. 01/21/14 01/21/15  Jamesetta So, MD  lisinopril-hydrochlorothiazide (PRINZIDE,ZESTORETIC) 20-12.5 MG per tablet Take 1 tablet by mouth daily. 04/26/14   Historical Provider, MD  lovastatin (MEVACOR) 10 MG tablet Take 1 tablet (10 mg total) by mouth at bedtime. 08/11/13   Modena Jansky, MD  naproxen (NAPROSYN) 500 MG tablet Take 1 tablet by mouth 2 (two) times daily. 04/26/14   Historical Provider, MD  pravastatin (PRAVACHOL) 40 MG tablet Take 1 tablet by mouth daily. 04/26/14   Historical Provider, MD   BP 121/82  Pulse 100  Temp(Src) 99.4 F (37.4 C) (Oral)  Resp 18  Ht 5\' 6"  (1.676 m)  Wt 183 lb (83.008 kg)  BMI 29.55 kg/m2  SpO2 99% Physical Exam  Constitutional: She is oriented to person, place, and time. She  appears well-developed and well-nourished.  Neck: Normal range of motion.  Pulmonary/Chest: Effort normal.  Musculoskeletal:  Right hand with well healed laceration over dorsal 2nd MCP. FROM. Non-tender.   Neurological: She is alert and oriented to person, place, and time.  Skin: Skin is warm and dry.    ED Course  Procedures (including critical care time) Labs Review Labs Reviewed - No data to display  Imaging Review No results found.   EKG Interpretation None      MDM   Final diagnoses:  None    1. Suture removal  Uncomplicated suture removal to well healed laceration.    Dewaine Oats, PA-C 06/01/14 1224

## 2014-06-01 NOTE — ED Provider Notes (Signed)
Medical screening examination/treatment/procedure(s) were performed by non-physician practitioner and as supervising physician I was immediately available for consultation/collaboration.   EKG Interpretation None        Maudry Diego, MD 06/01/14 [phone]

## 2014-06-01 NOTE — Discharge Instructions (Signed)

## 2014-06-01 NOTE — ED Notes (Signed)
PT had sutures to right hand on 05/22/14 and is here for suture removal. NO s/s of infection and denies pain.

## 2014-08-18 ENCOUNTER — Encounter (HOSPITAL_COMMUNITY): Payer: Self-pay | Admitting: Cardiovascular Disease

## 2014-09-19 IMAGING — CR DG CHEST 1V PORT
1 series · 1 of 1 positions shown · non-contrast
Comparison: None.

CLINICAL DATA: Chest pain

PORTABLE CHEST - 1 VIEW

[view not recorded]
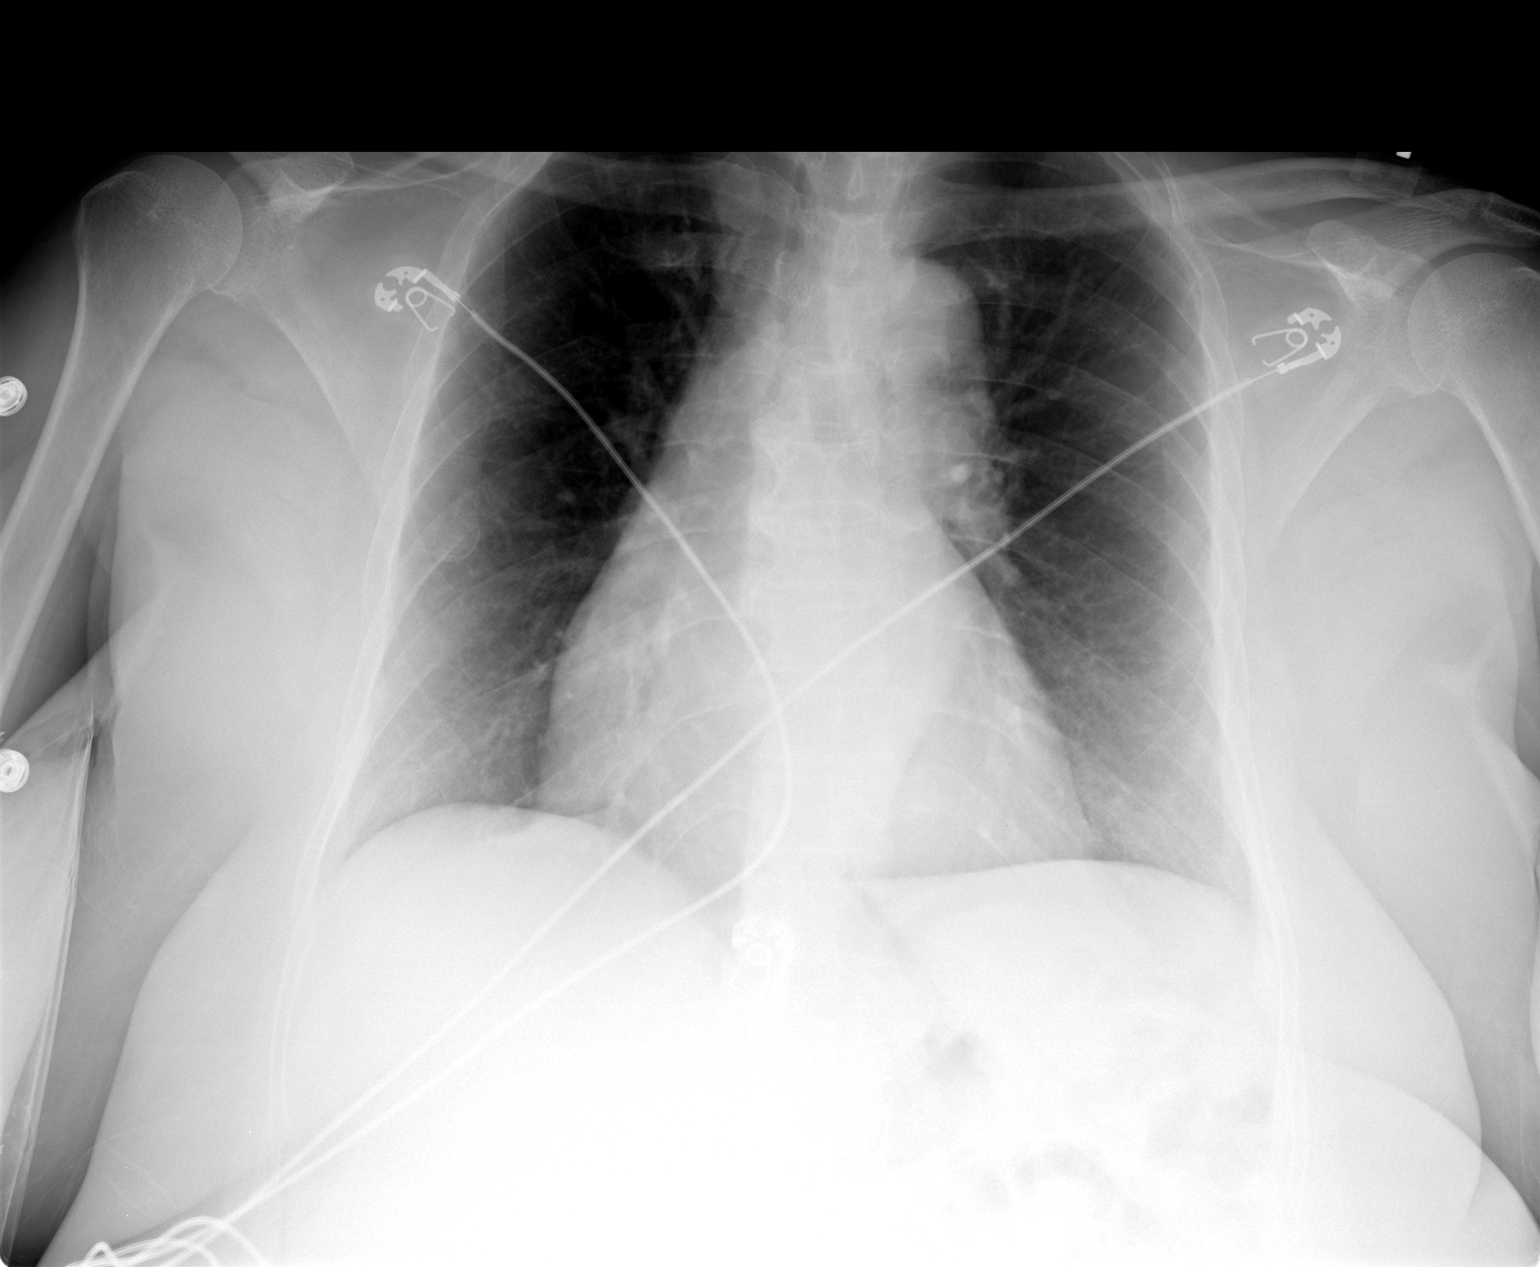

[1 of 1 positions shown; findings below may reference images not displayed]

FINDINGS: Borderline enlarged cardiac silhouette and mediastinal
contours, possibly accentuated due to AP projection and rotation.
Mild tortuosity of the thoracic aorta.  No focal airspace opacity.
No definite pleural effusion or pneumothorax.  No definite evidence
of edema.  No definite acute osseous abnormality.
IMPRESSION: 1.  No acute cardiopulmonary disease.
2.  Borderline enlarged cardiac silhouette and mediastinal
contours. No definite evidence of edema.

## 2014-09-24 ENCOUNTER — Emergency Department (HOSPITAL_COMMUNITY)
Admission: EM | Admit: 2014-09-24 | Discharge: 2014-09-24 | Disposition: A | Discharge status: Home / Self Care | Payer: 59 | Attending: Emergency Medicine | Admitting: Emergency Medicine

## 2014-09-24 ENCOUNTER — Encounter (HOSPITAL_COMMUNITY): Payer: Self-pay | Admitting: Emergency Medicine

## 2014-09-24 ENCOUNTER — Emergency Department (HOSPITAL_COMMUNITY): Payer: 59

## 2014-09-24 DIAGNOSIS — I10 Essential (primary) hypertension: Secondary | ICD-10-CM | POA: Diagnosis not present

## 2014-09-24 DIAGNOSIS — Z79899 Other long term (current) drug therapy: Secondary | ICD-10-CM | POA: Diagnosis not present

## 2014-09-24 DIAGNOSIS — Z8719 Personal history of other diseases of the digestive system: Secondary | ICD-10-CM | POA: Insufficient documentation

## 2014-09-24 DIAGNOSIS — Z9889 Other specified postprocedural states: Secondary | ICD-10-CM | POA: Diagnosis not present

## 2014-09-24 DIAGNOSIS — R079 Chest pain, unspecified: Secondary | ICD-10-CM | POA: Diagnosis present

## 2014-09-24 DIAGNOSIS — R0789 Other chest pain: Secondary | ICD-10-CM | POA: Insufficient documentation

## 2014-09-24 DIAGNOSIS — E780 Pure hypercholesterolemia: Secondary | ICD-10-CM | POA: Diagnosis not present

## 2014-09-24 DIAGNOSIS — Z7982 Long term (current) use of aspirin: Secondary | ICD-10-CM | POA: Insufficient documentation

## 2014-09-24 DIAGNOSIS — Z8739 Personal history of other diseases of the musculoskeletal system and connective tissue: Secondary | ICD-10-CM | POA: Insufficient documentation

## 2014-09-24 DIAGNOSIS — Z791 Long term (current) use of non-steroidal anti-inflammatories (NSAID): Secondary | ICD-10-CM | POA: Diagnosis not present

## 2014-09-24 LAB — CBC WITH DIFFERENTIAL/PLATELET
Basophils Absolute: 0 10*3/uL (ref 0.0–0.1)
Basophils Relative: 0 % (ref 0–1)
EOS ABS: 0.2 10*3/uL (ref 0.0–0.7)
Eosinophils Relative: 3 % (ref 0–5)
HCT: 33.2 % — ABNORMAL LOW (ref 36.0–46.0)
HEMOGLOBIN: 10.7 g/dL — AB (ref 12.0–15.0)
Lymphocytes Relative: 28 % (ref 12–46)
Lymphs Abs: 1.8 10*3/uL (ref 0.7–4.0)
MCH: 29 pg (ref 26.0–34.0)
MCHC: 32.2 g/dL (ref 30.0–36.0)
MCV: 90 fL (ref 78.0–100.0)
Monocytes Absolute: 0.5 10*3/uL (ref 0.1–1.0)
Monocytes Relative: 8 % (ref 3–12)
Neutro Abs: 4 10*3/uL (ref 1.7–7.7)
Neutrophils Relative %: 61 % (ref 43–77)
PLATELETS: 372 10*3/uL (ref 150–400)
RBC: 3.69 MIL/uL — ABNORMAL LOW (ref 3.87–5.11)
RDW: 14.1 % (ref 11.5–15.5)
WBC: 6.5 10*3/uL (ref 4.0–10.5)

## 2014-09-24 LAB — COMPREHENSIVE METABOLIC PANEL
ALK PHOS: 93 U/L (ref 39–117)
ALT: 15 U/L (ref 0–35)
AST: 15 U/L (ref 0–37)
Albumin: 4.5 g/dL (ref 3.5–5.2)
Anion gap: 8 (ref 5–15)
BUN: 23 mg/dL (ref 6–23)
CO2: 26 mmol/L (ref 19–32)
Calcium: 8.9 mg/dL (ref 8.4–10.5)
Chloride: 100 mEq/L (ref 96–112)
Creatinine, Ser: 0.72 mg/dL (ref 0.50–1.10)
Glucose, Bld: 110 mg/dL — ABNORMAL HIGH (ref 70–99)
POTASSIUM: 3.7 mmol/L (ref 3.5–5.1)
SODIUM: 134 mmol/L — AB (ref 135–145)
TOTAL PROTEIN: 7.5 g/dL (ref 6.0–8.3)
Total Bilirubin: 0.6 mg/dL (ref 0.3–1.2)

## 2014-09-24 LAB — TROPONIN I: Troponin I: <0.03 ng/mL (ref <0.031)

## 2014-09-24 MED ORDER — MORPHINE SULFATE 4 MG/ML IJ SOLN
INTRAMUSCULAR | Status: AC
  Filled 2014-09-24: qty 1

## 2014-09-24 MED ORDER — MORPHINE SULFATE 4 MG/ML IJ SOLN
4.0000 mg | Freq: Once | INTRAMUSCULAR | Status: AC
  Administered 2014-09-24: 4 mg via INTRAVENOUS

## 2014-09-24 MED ORDER — KETOROLAC TROMETHAMINE 30 MG/ML IJ SOLN
30.0000 mg | Freq: Once | INTRAMUSCULAR | Status: AC
  Administered 2014-09-24: 30 mg via INTRAVENOUS
  Filled 2014-09-24: qty 1

## 2014-09-24 MED ORDER — HYDROCODONE-ACETAMINOPHEN 5-325 MG PO TABS: 1.0000 | ORAL_TABLET | ORAL | Status: DC | PRN

## 2014-09-24 NOTE — ED Provider Notes (Signed)
CSN: [id number]     Arrival date & time 09/24/14  9794 History  This chart was scribed for Nat Christen, MD by Evelene Croon, ED Scribe. This patient was seen in room APA05/[facility] and the patient's care was started 8:40 AM.     Chief Complaint  Patient presents with  . Chest Pain     The history is provided by the patient. No language interpreter was used.     HPI Comments:  Kendra Valenzuela is a 54 y.o. female with a h/o HTN and HLD who presents to the Emergency Department complaining of constant non-radiating left sided CP with pain above her left breast that started yesterday evening about 1400 with associated nausea. She describes the pain as a pressing sensation.She also reports 1 bout of diarrhea and vomiting yesterday and lightheadedness which is still present at this time.  She denies SOB and diaphoresis but reports feeling hotter than usual and needing to got outside for fresh air. She also denies a h/o cardiac issues, diabetes, family hx of cardiac issues  and smoking hx. 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 No alleviating factors noted.   Past Medical History  Diagnosis Date  . Environmental allergies   . Back pain   . Chest pain 01/2013    Admitted to [facility] in 01/2013  . Hypertension 01/13/2013  . Headache(784.0)   . Hypercholesteremia   . GERD (gastroesophageal reflux disease)    Past Surgical History  Procedure Laterality Date  . Dilation and curettage of uterus    . Cardiac catheterization    . Unilateral salpingectomy Right     due to ectopic pregnancy  . Cholecystectomy N/A 01/21/2014    Procedure: LAPAROSCOPIC CHOLECYSTECTOMY;  Surgeon: Jamesetta So, MD;  Location: [facility];  Service: General;  Laterality: N/A;  . Left heart catheterization with coronary angiogram N/A 02/08/2013    Procedure: LEFT HEART CATHETERIZATION WITH CORONARY ANGIOGRAM;  Surgeon: Burnell Blanks, MD;  Location: [facility] CATH LAB;  Service: Cardiovascular;  Laterality: N/A;   Family History  Problem  Relation Age of Onset  . Hypertension Father    History  Substance Use Topics  . Smoking status: Never Smoker   . Smokeless tobacco: Never Used  . Alcohol Use: 8.4 oz/week    14 Cans of beer per week     Comment: 2 cans of beer daily    OB History    Gravida Para Term Preterm AB TAB SAB Ectopic Multiple Living            0     Review of Systems  At least 10pt or greater review of systems completed and are negative except where specified in the HPI.    Allergies  Review of patient's allergies indicates no known allergies.  Home Medications   Prior to Admission medications   Medication Sig Start Date End Date Taking? Authorizing Provider  acetaminophen (TYLENOL) 500 MG tablet Take 1,000 mg by mouth every 6 (six) hours as needed for pain.   Yes Historical Provider, MD  aspirin EC 81 MG tablet Take 81 mg by mouth daily.   Yes Historical Provider, MD  lisinopril-hydrochlorothiazide (PRINZIDE,ZESTORETIC) 20-12.5 MG per tablet Take 1 tablet by mouth daily. 04/26/14  Yes Historical Provider, MD  LORazepam (ATIVAN) 1 MG tablet 1 mg at bedtime. 08/25/14  Yes Historical Provider, MD  Multiple Vitamins-Minerals (MULTIVITAMIN WITH MINERALS) tablet Take 1 tablet by mouth daily.   Yes Historical Provider, MD  naproxen (NAPROSYN) 500 MG tablet Take  1 tablet by mouth 2 (two) times daily. 04/26/14  Yes Historical Provider, MD  pravastatin (PRAVACHOL) 40 MG tablet Take 1 tablet by mouth daily. 04/26/14  Yes Historical Provider, MD  diphenhydrAMINE (BENADRYL) 25 MG tablet Take 50 mg by mouth 2 (two) times daily as needed for allergies.    Historical Provider, MD  HYDROcodone-acetaminophen (NORCO) 5-325 MG per tablet Take 1-2 tablets by mouth every 4 (four) hours as needed. 09/24/14   Nat Christen, MD  lovastatin (MEVACOR) 10 MG tablet Take 1 tablet (10 mg total) by mouth at bedtime. Patient not taking: Reported on 09/24/2014 08/11/13   Modena Jansky, MD   BP 118/75 mmHg  Pulse 71  Temp(Src) 99 F  (37.2 C) (Oral)  Resp 17  Ht 5\' 6"  (1.676 m)  Wt 173 lb (78.472 kg)  BMI 27.94 kg/m2  SpO2 98% Physical Exam  Constitutional: She is oriented to person, place, and time. She appears well-developed and well-nourished.  HENT:  Head: Normocephalic and atraumatic.  Eyes: Conjunctivae and EOM are normal. Pupils are equal, round, and reactive to light.  Neck: Normal range of motion. Neck supple.  Cardiovascular: Normal rate and regular rhythm.   Pulmonary/Chest: Effort normal and breath sounds normal.  Abdominal: Soft. Bowel sounds are normal.  Musculoskeletal: Normal range of motion.  Neurological: She is alert and oriented to person, place, and time.  Skin: Skin is warm and dry.  Psychiatric: She has a normal mood and affect. Her behavior is normal.  Nursing note and vitals reviewed.   ED Course  Procedures   DIAGNOSTIC STUDIES:  Oxygen Saturation is 97% on RA, normal by my interpretation.    COORDINATION OF CARE:  8:48 AM Discussed cardiac risk factors and cardiac workup with pt at bedside and pt agreed to plan.   Labs Review Labs Reviewed  CBC WITH DIFFERENTIAL - Abnormal; Notable for the following:    RBC 3.69 (*)    Hemoglobin 10.7 (*)    HCT 33.2 (*)    All other components within normal limits  COMPREHENSIVE METABOLIC PANEL - Abnormal; Notable for the following:    Sodium 134 (*)    Glucose, Bld 110 (*)    All other components within normal limits  TROPONIN I  TROPONIN I    Imaging Review Dg Chest 2 View  09/24/2014   CLINICAL DATA:  54 year old female with chest pain and nausea  EXAM: CHEST  2 VIEW  COMPARISON:  Prior CT abdomen/ pelvis 01/19/2014; prior chest x-ray 07/21/2013  FINDINGS: Stable cardiac and mediastinal contours. There is a mild cardiomegaly. The aorta is atherosclerotic and tortuous. Mild pulmonary hyperinflation, central bronchitic change in coarsening of the interstitial markings are similar compared to prior and favored to be chronic. There is  slight pulmonary vascular congestion without overt edema. No focal airspace consolidation, pleural effusion or pneumothorax. No acute osseous abnormality.  IMPRESSION: 1. Stable chest x-ray without evidence of acute cardiopulmonary process. 2. Unchanged mild cardiomegaly. 3. Aortic and atherosclerotic thoracic aorta. 4. Pulmonary vascular congestion without overt edema.   Electronically Signed   By: Jacqulynn Cadet M.D.   On: 09/24/2014 09:15     EKG Interpretation   Date/Time:  Saturday September 24 2014 08:25:06 EST Ventricular Rate:  89 PR Interval:  171 QRS Duration: 116 QT Interval:  355 QTC Calculation: 432 R Axis:   8 Text Interpretation:  Sinus rhythm Nonspecific intraventricular conduction  delay Confirmed by Britian Jentz  MD, Stephfon Bovey ([id number]) on 09/24/2014 8:39:31 AM  MDM   Final diagnoses:  Chest pain, unspecified chest pain type    Patient presents with chest pain. EKG, troponin 2, chest x-ray all stable. Patient has primary care follow-up. Discharge medications Norco  I personally performed the services described in this documentation, which was scribed in my presence. The recorded information has been reviewed and is accurate.      Nat Christen, MD 09/24/14 1440

## 2014-09-24 NOTE — ED Notes (Signed)
Patient with no complaints at this time. Respirations even and unlabored. Skin warm/dry. Discharge instructions reviewed with patient at this time. Patient given opportunity to voice concerns/ask questions. IV removed per policy and band-aid applied to site. Patient discharged at this time and left Emergency Department with steady gait.  

## 2014-09-24 NOTE — Discharge Instructions (Signed)
Chest Pain (Nonspecific) °It is often hard to give a specific diagnosis for the cause of chest pain. There is always a chance that your pain could be related to something serious, such as a heart attack or a blood clot in the lungs. You need to follow up with your health care provider for further evaluation. °CAUSES  °· Heartburn. °· Pneumonia or bronchitis. °· Anxiety or stress. °· Inflammation around your heart (pericarditis) or lung (pleuritis or pleurisy). °· A blood clot in the lung. °· A collapsed lung (pneumothorax). It can develop suddenly on its own (spontaneous pneumothorax) or from trauma to the chest. °· Shingles infection (herpes zoster virus). °The chest wall is composed of bones, muscles, and cartilage. Any of these can be the source of the pain. °· The bones can be bruised by injury. °· The muscles or cartilage can be strained by coughing or overwork. °· The cartilage can be affected by inflammation and become sore (costochondritis). °DIAGNOSIS  °Lab tests or other studies may be needed to find the cause of your pain. Your health care provider may have you take a test called an ambulatory electrocardiogram (ECG). An ECG records your heartbeat patterns over a 24-hour period. You may also have other tests, such as: °· Transthoracic echocardiogram (TTE). During echocardiography, sound waves are used to evaluate how blood flows through your heart. °· Transesophageal echocardiogram (TEE). °· Cardiac monitoring. This allows your health care provider to monitor your heart rate and rhythm in real time. °· Holter monitor. This is a portable device that records your heartbeat and can help diagnose heart arrhythmias. It allows your health care provider to track your heart activity for several days, if needed. °· Stress tests by exercise or by giving medicine that makes the heart beat faster. °TREATMENT  °· Treatment depends on what may be causing your chest pain. Treatment may include: °¨ Acid blockers for  heartburn. °¨ Anti-inflammatory medicine. °¨ Pain medicine for inflammatory conditions. °¨ Antibiotics if an infection is present. °· You may be advised to change lifestyle habits. This includes stopping smoking and avoiding alcohol, caffeine, and chocolate. °· You may be advised to keep your head raised (elevated) when sleeping. This reduces the chance of acid going backward from your stomach into your esophagus. °Most of the time, nonspecific chest pain will improve within 2-3 days with rest and mild pain medicine.  °HOME CARE INSTRUCTIONS  °· If antibiotics were prescribed, take them as directed. Finish them even if you start to feel better. °· For the next few days, avoid physical activities that bring on chest pain. Continue physical activities as directed. °· Do not use any tobacco products, including cigarettes, chewing tobacco, or electronic cigarettes. °· Avoid drinking alcohol. °· Only take medicine as directed by your health care provider. °· Follow your health care provider's suggestions for further testing if your chest pain does not go away. °· Keep any follow-up appointments you made. If you do not go to an appointment, you could develop lasting (chronic) problems with pain. If there is any problem keeping an appointment, call to reschedule. °SEEK MEDICAL CARE IF:  °· Your chest pain does not go away, even after treatment. °· You have a rash with blisters on your chest. °· You have a fever. °SEEK IMMEDIATE MEDICAL CARE IF:  °· You have increased chest pain or pain that spreads to your arm, neck, jaw, back, or abdomen. °· You have shortness of breath. °· You have an increasing cough, or you cough   up blood.  You have severe back or abdominal pain.  You feel nauseous or vomit.  You have severe weakness.  You faint.  You have chills. This is an emergency. Do not wait to see if the pain will go away. Get medical help at once. Call your local emergency services (911 in [place] Do not drive  yourself to the hospital. MAKE SURE YOU:   Understand these instructions.  Will watch your condition.  Will get help right away if you are not doing well or get worse. Document Released: 06/05/2005 Document Revised: 08/31/2013 Document Reviewed: 03/31/2008 [facility] Patient Information 2015 [place], [place]. This information is not intended to replace advice given to you by your health care provider. Make sure you discuss any questions you have with your health care provider.   Tests were good. Follow-up your primary care doctor next week. Medication for pain.

## 2014-09-24 NOTE — ED Notes (Addendum)
Patient c/o left side chest pain that started yesterday. Per patient some nausea and "felt like she was going to pass out" yesterday but not today. Reports some shortness of breath. Has had cardiac cath in 2015. Took Bayer this morning.

## 2014-11-22 ENCOUNTER — Encounter (HOSPITAL_COMMUNITY): Payer: Self-pay | Admitting: Emergency Medicine

## 2014-11-22 ENCOUNTER — Emergency Department (HOSPITAL_COMMUNITY)
Admission: EM | Admit: 2014-11-22 | Discharge: 2014-11-22 | Disposition: A | Discharge status: Home / Self Care | Payer: 59 | Attending: Emergency Medicine | Admitting: Emergency Medicine

## 2014-11-22 ENCOUNTER — Emergency Department (HOSPITAL_COMMUNITY): Payer: 59

## 2014-11-22 DIAGNOSIS — E780 Pure hypercholesterolemia: Secondary | ICD-10-CM | POA: Diagnosis not present

## 2014-11-22 DIAGNOSIS — B349 Viral infection, unspecified: Secondary | ICD-10-CM | POA: Diagnosis not present

## 2014-11-22 DIAGNOSIS — Z7982 Long term (current) use of aspirin: Secondary | ICD-10-CM | POA: Insufficient documentation

## 2014-11-22 DIAGNOSIS — Z9889 Other specified postprocedural states: Secondary | ICD-10-CM | POA: Diagnosis not present

## 2014-11-22 DIAGNOSIS — Z79899 Other long term (current) drug therapy: Secondary | ICD-10-CM | POA: Diagnosis not present

## 2014-11-22 DIAGNOSIS — Z8709 Personal history of other diseases of the respiratory system: Secondary | ICD-10-CM | POA: Insufficient documentation

## 2014-11-22 DIAGNOSIS — Z8719 Personal history of other diseases of the digestive system: Secondary | ICD-10-CM | POA: Insufficient documentation

## 2014-11-22 DIAGNOSIS — M542 Cervicalgia: Secondary | ICD-10-CM | POA: Insufficient documentation

## 2014-11-22 DIAGNOSIS — R52 Pain, unspecified: Secondary | ICD-10-CM | POA: Diagnosis present

## 2014-11-22 LAB — CBC WITH DIFFERENTIAL/PLATELET
BASOS PCT: 0 % (ref 0–1)
Basophils Absolute: 0 10*3/uL (ref 0.0–0.1)
EOS ABS: 0.1 10*3/uL (ref 0.0–0.7)
Eosinophils Relative: 1 % (ref 0–5)
HCT: 36.1 % (ref 36.0–46.0)
HEMOGLOBIN: 11.6 g/dL — AB (ref 12.0–15.0)
Lymphocytes Relative: 12 % (ref 12–46)
Lymphs Abs: 2.1 10*3/uL (ref 0.7–4.0)
MCH: 28 pg (ref 26.0–34.0)
MCHC: 32.1 g/dL (ref 30.0–36.0)
MCV: 87.2 fL (ref 78.0–100.0)
MONO ABS: 0.6 10*3/uL (ref 0.1–1.0)
MONOS PCT: 3 % (ref 3–12)
NEUTROS PCT: 84 % — AB (ref 43–77)
Neutro Abs: 14.2 10*3/uL — ABNORMAL HIGH (ref 1.7–7.7)
Platelets: 299 10*3/uL (ref 150–400)
RBC: 4.14 MIL/uL (ref 3.87–5.11)
RDW: 14 % (ref 11.5–15.5)
WBC: 16.9 10*3/uL — ABNORMAL HIGH (ref 4.0–10.5)

## 2014-11-22 LAB — COMPREHENSIVE METABOLIC PANEL
ALK PHOS: 105 U/L (ref 39–117)
ALT: 17 U/L (ref 0–35)
AST: 28 U/L (ref 0–37)
Albumin: 4.2 g/dL (ref 3.5–5.2)
Anion gap: 9 (ref 5–15)
BILIRUBIN TOTAL: 0.8 mg/dL (ref 0.3–1.2)
BUN: 17 mg/dL (ref 6–23)
CHLORIDE: 107 mmol/L (ref 96–112)
CO2: 26 mmol/L (ref 19–32)
CREATININE: 0.79 mg/dL (ref 0.50–1.10)
Calcium: 8.8 mg/dL (ref 8.4–10.5)
GFR calc Af Amer: >90 mL/min (ref >90)
GFR calc non Af Amer: >90 mL/min (ref >90)
GLUCOSE: 77 mg/dL (ref 70–99)
Potassium: 3.5 mmol/L (ref 3.5–5.1)
Sodium: 142 mmol/L (ref 135–145)
Total Protein: 7.6 g/dL (ref 6.0–8.3)

## 2014-11-22 LAB — URINALYSIS, ROUTINE W REFLEX MICROSCOPIC
BILIRUBIN URINE: NEGATIVE
Glucose, UA: NEGATIVE mg/dL
Hgb urine dipstick: NEGATIVE
KETONES UR: NEGATIVE mg/dL
NITRITE: NEGATIVE
PH: 5.5 (ref 5.0–8.0)
Protein, ur: NEGATIVE mg/dL
Specific Gravity, Urine: >1.03 — ABNORMAL HIGH (ref 1.005–1.030)
Urobilinogen, UA: 0.2 mg/dL (ref 0.0–1.0)

## 2014-11-22 LAB — URINE MICROSCOPIC-ADD ON

## 2014-11-22 MED ORDER — SODIUM CHLORIDE 0.9 % IV BOLUS (SEPSIS)
1000.0000 mL | Freq: Once | INTRAVENOUS | Status: AC
Start: 2014-11-22 — End: 2014-11-22
  Administered 2014-11-22: 1000 mL via INTRAVENOUS

## 2014-11-22 MED ORDER — PROMETHAZINE-CODEINE 6.25-10 MG/5ML PO SYRP: 5.0000 mL | ORAL_SOLUTION | ORAL | Status: DC | PRN

## 2014-11-22 MED ORDER — IBUPROFEN 800 MG PO TABS
800.0000 mg | ORAL_TABLET | Freq: Once | ORAL | Status: AC
  Administered 2014-11-22: 800 mg via ORAL
  Filled 2014-11-22: qty 1

## 2014-11-22 MED ORDER — HYDROMORPHONE HCL 1 MG/ML IJ SOLN
1.0000 mg | Freq: Once | INTRAMUSCULAR | Status: AC
  Administered 2014-11-22: 1 mg via INTRAVENOUS
  Filled 2014-11-22: qty 1

## 2014-11-22 MED ORDER — ONDANSETRON HCL 4 MG/2ML IJ SOLN
4.0000 mg | Freq: Once | INTRAMUSCULAR | Status: AC
  Administered 2014-11-22: 4 mg via INTRAVENOUS
  Filled 2014-11-22: qty 2

## 2014-11-22 NOTE — ED Notes (Signed)
Pt unable to give clear history of why she is hear. States she has felt weak all day and has had fevers on and off. Denies any nausea/vomiting. NAD noted at this time.

## 2014-11-22 NOTE — ED Provider Notes (Signed)
CSN: [id number]     Arrival date & time 11/22/14  1454 History   First MD Initiated Contact with Patient 11/22/14 [phone]     Chief Complaint  Patient presents with  . Generalized Body Aches     (Consider location/radiation/quality/duration/timing/severity/associated sxs/prior Treatment) The history is provided by the patient.   Kendra Valenzuela is a 54 y.o. female with a history of hypertension and GERD presenting with generalized body aches, subjective fever, generalized weakness along with 2 episodes of brown liquid diarrhea without blood or mucus.  She denies nausea or vomiting, denies abdominal pain.  She does have generalized headache along with complaints of tightness in her neck.  She also endorses light sensitivity, denies visual changes.  No rash.  She denies recent antibiotic use or foreign travel.  She has had a mild nonproductive cough.  She denies shortness of breath, chest pain, sore throat.  She is taken Tylenol this morning around 9 AM.  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  She has had a flu vaccine this season.    Past Medical History  Diagnosis Date  . Environmental allergies   . Back pain   . Chest pain 01/2013    Admitted to [facility] in 01/2013  . Hypertension 01/13/2013  . Headache(784.0)   . Hypercholesteremia   . GERD (gastroesophageal reflux disease)    Past Surgical History  Procedure Laterality Date  . Dilation and curettage of uterus    . Cardiac catheterization    . Unilateral salpingectomy Right     due to ectopic pregnancy  . Cholecystectomy N/A 01/21/2014    Procedure: LAPAROSCOPIC CHOLECYSTECTOMY;  Surgeon: Jamesetta So, MD;  Location: [facility];  Service: General;  Laterality: N/A;  . Left heart catheterization with coronary angiogram N/A 02/08/2013    Procedure: LEFT HEART CATHETERIZATION WITH CORONARY ANGIOGRAM;  Surgeon: Burnell Blanks, MD;   Location: [facility] CATH LAB;  Service: Cardiovascular;  Laterality: N/A;   Family History  Problem Relation Age of Onset  . Hypertension Father    History  Substance Use Topics  . Smoking status: Never Smoker   . Smokeless tobacco: Never Used  . Alcohol Use: 8.4 oz/week    14 Cans of beer per week     Comment: 2 cans of beer daily    OB History    Gravida Para Term Preterm AB TAB SAB Ectopic Multiple Living            0     Review of Systems  Constitutional: Positive for fever and chills.  HENT: Positive for sore throat. Negative for congestion and rhinorrhea.   Eyes: Positive for photophobia.  Respiratory: Positive for cough. Negative for chest tightness and shortness of breath.   Cardiovascular: Negative for chest pain.  Gastrointestinal: Positive for diarrhea. Negative for nausea, vomiting and abdominal pain.  Genitourinary: Negative.   Musculoskeletal: Negative for joint swelling, arthralgias and neck pain.  Skin: Negative.  Negative for rash and wound.  Neurological: Negative for dizziness, weakness, light-headedness, numbness and headaches.  Psychiatric/Behavioral: Negative.       Allergies  Review of patient's allergies indicates no known allergies.  Home Medications   Prior to Admission medications   Medication Sig Start Date End Date Taking? Authorizing Provider  acetaminophen (TYLENOL) 500 MG tablet Take 1,000 mg by mouth every 6 (six) hours as needed for pain.   Yes Historical Provider, MD  aspirin EC 81 MG tablet Take 81 mg by mouth daily.   Yes Historical Provider, MD  diphenhydrAMINE (BENADRYL) 25 MG tablet Take 50 mg by mouth 2 (two) times daily as needed for allergies.   Yes Historical Provider, MD  Multiple Vitamins-Minerals (MULTIVITAMIN WITH MINERALS) tablet Take 1 tablet by mouth daily.   Yes Historical Provider, MD  HYDROcodone-acetaminophen (NORCO) 5-325 MG per tablet Take 1-2 tablets by mouth every 4 (four) hours as needed. Patient not taking: Reported  on 11/22/2014 09/24/14   Nat Christen, MD  lisinopril-hydrochlorothiazide (PRINZIDE,ZESTORETIC) 20-12.5 MG per tablet Take 1 tablet by mouth daily. 04/26/14   Historical Provider, MD  lovastatin (MEVACOR) 10 MG tablet Take 1 tablet (10 mg total) by mouth at bedtime. Patient not taking: Reported on 09/24/2014 08/11/13   Modena Jansky, MD  pravastatin (PRAVACHOL) 40 MG tablet Take 1 tablet by mouth daily. 04/26/14   Historical Provider, MD  promethazine-codeine (PHENERGAN WITH CODEINE) 6.25-10 MG/5ML syrup Take 5 mLs by mouth every 4 (four) hours as needed for cough (body aches and headache). 11/22/14   Evalee Jefferson, PA-C   BP 114/77 mmHg  Pulse 109  Temp(Src) 99.9 F (37.7 C) (Oral)  Resp 16  Ht 5\' 6"  (1.676 m)  Wt 173 lb (78.472 kg)  BMI 27.94 kg/m2  SpO2 96% Physical Exam  Constitutional: She is oriented to person, place, and time. She appears well-developed and well-nourished.  HENT:  Head: Normocephalic and atraumatic.  Right Ear: Tympanic membrane and ear canal normal.  Left Ear: Tympanic membrane and ear canal normal.  Nose: Mucosal edema present.  Mouth/Throat: No oropharyngeal exudate, posterior oropharyngeal edema, posterior oropharyngeal erythema or tonsillar abscesses.  Eyes: Conjunctivae and EOM are normal. Pupils are equal, round, and reactive to light.  Neck: Normal range of motion and full passive range of motion without pain. Neck supple. Muscular tenderness present. No Brudzinski's sign and no Kernig's sign noted.  Cardiovascular: Normal rate, regular rhythm, normal heart sounds and intact distal pulses.   Pulmonary/Chest: Effort normal and breath sounds normal. She has no decreased breath sounds. She has no wheezes. She has no rhonchi.  Abdominal: Soft. Bowel sounds are normal. There is no hepatosplenomegaly. There is no tenderness. There is no rebound and no CVA tenderness.  Musculoskeletal: Normal range of motion.  Lymphadenopathy:    She has no cervical adenopathy.   Neurological: She is alert and oriented to person, place, and time. She has normal strength. No cranial nerve deficit or sensory deficit.  Skin: Skin is warm and dry.  Psychiatric: She has a normal mood and affect.  Nursing note and vitals reviewed.   ED Course  Procedures (including critical care time) Labs Review Labs Reviewed  CBC WITH DIFFERENTIAL/PLATELET - Abnormal; Notable for the following:    WBC 16.9 (*)    Hemoglobin 11.6 (*)    Neutrophils Relative % 84 (*)    Neutro Abs 14.2 (*)    All other components within normal limits  URINALYSIS, ROUTINE W REFLEX MICROSCOPIC - Abnormal; Notable for the following:    Specific Gravity, Urine >1.030 (*)    Leukocytes, UA SMALL (*)    All other components within normal limits  URINE MICROSCOPIC-ADD ON - Abnormal; Notable for the following:    Squamous Epithelial / LPF FEW (*)    All other components within normal limits  COMPREHENSIVE METABOLIC PANEL    Imaging Review Dg Chest 2 View  11/22/2014   CLINICAL DATA:  Generalized weakness am body aches.  EXAM: CHEST  2 VIEW  COMPARISON:  09/24/2014  FINDINGS: Moderate cardiomegaly. Normal vascularity. No interstitial edema. Stable appearance of the upper mediastinum. No pleural effusion. No pneumothorax.  IMPRESSION: Cardiomegaly without decompensation.   Electronically Signed   By: Marybelle Killings M.D.   On: 11/22/2014 17:39     EKG Interpretation None      MDM   Final diagnoses:  Viral syndrome    Patients labs and/or radiological studies were reviewed and considered during the medical decision making and disposition process.  Results were also discussed with patient.  Patient with symptoms suggesting viral process.  She received IV fluids and ibuprofen here which improved her fever, still with mild headache but felt better.  She tolerated by mouth fluids.  She'll be prescribed home with Phenergan with codeine syrup, also discussed alternating ibuprofen and Tylenol every 3 hours  when necessary fever.  Rest, increase fluid intake.  Follow-up with her PCP this week for recheck for any worsening symptoms.  She had no further diarrhea while here.  Diarrhea being very mild symptoms, no treatment indicated at this time.  Encouraged bland diet.  Patient was seen by Dr. Roderic Palau during this ED visit.  On the patient has complaints of headache and neck tightness she has no meningeal signs on his exam.  Suspect viral process.    Evalee Jefferson, PA-C 11/22/14 [place], PA-C 11/22/14 1851  Milton Ferguson, MD 11/22/14 2023

## 2014-11-22 NOTE — Discharge Instructions (Signed)
Viral Infections A viral infection can be caused by different types of viruses.Most viral infections are not serious and resolve on their own. However, some infections may cause severe symptoms and may lead to further complications. SYMPTOMS Viruses can frequently cause:  Minor sore throat.  Aches and pains.  Headaches.  Runny nose.  Different types of rashes.  Watery eyes.  Tiredness.  Cough.  Loss of appetite.  Gastrointestinal infections, resulting in nausea, vomiting, and diarrhea. These symptoms do not respond to antibiotics because the infection is not caused by bacteria. However, you might catch a bacterial infection following the viral infection. This is sometimes called a "superinfection." Symptoms of such a bacterial infection may include:  Worsening sore throat with pus and difficulty swallowing.  Swollen neck glands.  Chills and a high or persistent fever.  Severe headache.  Tenderness over the sinuses.  Persistent overall ill feeling (malaise), muscle aches, and tiredness (fatigue).  Persistent cough.  Yellow, green, or brown mucus production with coughing. HOME CARE INSTRUCTIONS   Only take over-the-counter or prescription medicines for pain, discomfort, diarrhea, or fever as directed by your caregiver.  Drink enough water and fluids to keep your urine clear or pale yellow. Sports drinks can provide valuable electrolytes, sugars, and hydration.  Get plenty of rest and maintain proper nutrition. Soups and broths with crackers or rice are fine. SEEK IMMEDIATE MEDICAL CARE IF:   You have severe headaches, shortness of breath, chest pain, neck pain, or an unusual rash.  You have uncontrolled vomiting, diarrhea, or you are unable to keep down fluids.  You or your child has an oral temperature above 102 F (38.9 C), not controlled by medicine.  Your baby is older than 3 months with a rectal temperature of 102 F (38.9 C) or higher.  Your baby is 20  months old or younger with a rectal temperature of 100.4 F (38 C) or higher. MAKE SURE YOU:   Understand these instructions.  Will watch your condition.  Will get help right away if you are not doing well or get worse. Document Released: 06/05/2005 Document Revised: 11/18/2011 Document Reviewed: 12/31/2010 [id number] Patient Information 2015 [place], [place]. This information is not intended to replace advice given to you by your health care provider. Make sure you discuss any questions you have with your health care provider.   Rest and make sure he is drinking plenty of fluids.  You may alternate Tylenol and Motrin as discussed every 3 hours taken the opposite medicine which can help your fevers and body aches.  Use the prescription given for body aches and cough.  This should also help your headache.  Do not drive within 4's of taking this medicine as it will make you sleepy.  Follow-up with your primary doctor this week for recheck if symptoms are not improving.

## 2014-11-22 NOTE — ED Notes (Signed)
Generalized body aches stated this am.  Have not taken any medications.

## 2015-09-14 ENCOUNTER — Emergency Department (HOSPITAL_COMMUNITY): Payer: Self-pay

## 2015-09-14 ENCOUNTER — Encounter (HOSPITAL_COMMUNITY): Payer: Self-pay | Admitting: Emergency Medicine

## 2015-09-14 ENCOUNTER — Emergency Department (HOSPITAL_COMMUNITY)
Admission: EM | Admit: 2015-09-14 | Discharge: 2015-09-14 | Disposition: A | Discharge status: Home / Self Care | Payer: Self-pay | Attending: Emergency Medicine | Admitting: Emergency Medicine

## 2015-09-14 DIAGNOSIS — Z8719 Personal history of other diseases of the digestive system: Secondary | ICD-10-CM | POA: Insufficient documentation

## 2015-09-14 DIAGNOSIS — Z9889 Other specified postprocedural states: Secondary | ICD-10-CM | POA: Insufficient documentation

## 2015-09-14 DIAGNOSIS — M546 Pain in thoracic spine: Secondary | ICD-10-CM | POA: Insufficient documentation

## 2015-09-14 DIAGNOSIS — E7800 Pure hypercholesterolemia, unspecified: Secondary | ICD-10-CM | POA: Insufficient documentation

## 2015-09-14 DIAGNOSIS — M62838 Other muscle spasm: Secondary | ICD-10-CM

## 2015-09-14 DIAGNOSIS — R0789 Other chest pain: Secondary | ICD-10-CM | POA: Insufficient documentation

## 2015-09-14 DIAGNOSIS — Z79899 Other long term (current) drug therapy: Secondary | ICD-10-CM | POA: Insufficient documentation

## 2015-09-14 DIAGNOSIS — M62830 Muscle spasm of back: Secondary | ICD-10-CM | POA: Insufficient documentation

## 2015-09-14 DIAGNOSIS — I10 Essential (primary) hypertension: Secondary | ICD-10-CM | POA: Insufficient documentation

## 2015-09-14 DIAGNOSIS — Z7982 Long term (current) use of aspirin: Secondary | ICD-10-CM | POA: Insufficient documentation

## 2015-09-14 LAB — D-DIMER, QUANTITATIVE (NOT AT ARMC)

## 2015-09-14 LAB — BASIC METABOLIC PANEL
Anion gap: 8 (ref 5–15)
BUN: 14 mg/dL (ref 6–20)
CO2: 28 mmol/L (ref 22–32)
Calcium: 9 mg/dL (ref 8.9–10.3)
Chloride: 107 mmol/L (ref 101–111)
Creatinine, Ser: 0.66 mg/dL (ref 0.44–1.00)
GFR calc non Af Amer: >60 mL/min (ref >60)
Glucose, Bld: 139 mg/dL — ABNORMAL HIGH (ref 65–99)
Potassium: 3.6 mmol/L (ref 3.5–5.1)
SODIUM: 143 mmol/L (ref 135–145)

## 2015-09-14 LAB — URINE MICROSCOPIC-ADD ON: RBC / HPF: NONE SEEN RBC/hpf (ref 0–5)

## 2015-09-14 LAB — CBC WITH DIFFERENTIAL/PLATELET
BASOS ABS: 0 10*3/uL (ref 0.0–0.1)
BASOS PCT: 0 %
EOS ABS: 0.2 10*3/uL (ref 0.0–0.7)
Eosinophils Relative: 2 %
HCT: 34.8 % — ABNORMAL LOW (ref 36.0–46.0)
HEMOGLOBIN: 10.9 g/dL — AB (ref 12.0–15.0)
Lymphocytes Relative: 39 %
Lymphs Abs: 3.5 10*3/uL (ref 0.7–4.0)
MCH: 27.6 pg (ref 26.0–34.0)
MCHC: 31.3 g/dL (ref 30.0–36.0)
MCV: 88.1 fL (ref 78.0–100.0)
MONOS PCT: 7 %
Monocytes Absolute: 0.7 10*3/uL (ref 0.1–1.0)
Neutro Abs: 4.6 10*3/uL (ref 1.7–7.7)
Neutrophils Relative %: 52 %
Platelets: 304 10*3/uL (ref 150–400)
RBC: 3.95 MIL/uL (ref 3.87–5.11)
RDW: 14.5 % (ref 11.5–15.5)
WBC: 8.9 10*3/uL (ref 4.0–10.5)

## 2015-09-14 LAB — URINALYSIS, ROUTINE W REFLEX MICROSCOPIC
BILIRUBIN URINE: NEGATIVE
Glucose, UA: NEGATIVE mg/dL
HGB URINE DIPSTICK: NEGATIVE
Ketones, ur: NEGATIVE mg/dL
Nitrite: NEGATIVE
PROTEIN: NEGATIVE mg/dL
Specific Gravity, Urine: 1.01 (ref 1.005–1.030)
pH: 5.5 (ref 5.0–8.0)

## 2015-09-14 LAB — TROPONIN I

## 2015-09-14 MED ORDER — DIAZEPAM 2 MG PO TABS: 2.0000 mg | ORAL_TABLET | Freq: Two times a day (BID) | ORAL | Status: DC

## 2015-09-14 MED ORDER — DIAZEPAM 2 MG PO TABS
2.0000 mg | ORAL_TABLET | Freq: Once | ORAL | Status: AC
  Administered 2015-09-14: 2 mg via ORAL
  Filled 2015-09-14: qty 1

## 2015-09-14 MED ORDER — IBUPROFEN 800 MG PO TABS
800.0000 mg | ORAL_TABLET | Freq: Once | ORAL | Status: AC
  Administered 2015-09-14: 800 mg via ORAL
  Filled 2015-09-14: qty 1

## 2015-09-14 MED ORDER — METRONIDAZOLE 500 MG PO TABS
2000.0000 mg | ORAL_TABLET | Freq: Once | ORAL | Status: AC
  Administered 2015-09-14: 2000 mg via ORAL
  Filled 2015-09-14: qty 4

## 2015-09-14 MED ORDER — IBUPROFEN 800 MG PO TABS: 800.0000 mg | ORAL_TABLET | Freq: Three times a day (TID) | ORAL | Status: DC

## 2015-09-14 NOTE — ED Notes (Signed)
Scanner would not recognize pt's barcode. Name, DOB and MRN used to identify.

## 2015-09-14 NOTE — ED Notes (Signed)
Pt c/o left shoulder pain that started last night and pt denies injury.

## 2015-09-14 NOTE — ED Provider Notes (Signed)
CSN: [id number]     Arrival date & time 09/14/15  1953 History   None    Chief Complaint  Patient presents with  . Shoulder Pain     (Consider location/radiation/quality/duration/timing/severity/associated sxs/prior Treatment) HPI Comments: Patient reports pain to left mid back that radiates to chest since yesterday. It is constant. Nothing makes it better. It is worse with movement and palpation. She took Tylenol without relief. Denies any falls or injuries. Denies any lifting injury. Contrary to triage note she does not have shoulder pain. There is no fever, shortness of breath, nausea, vomiting or abdominal pain. No rash. No focal weakness, numbness or tingling. Patient has a history of hypertension, high cholesterol.  The history is provided by the patient.    Past Medical History  Diagnosis Date  . Environmental allergies   . Back pain   . Chest pain 01/2013    Admitted to [facility] in 01/2013  . Hypertension 01/13/2013  . Headache(784.0)   . Hypercholesteremia   . GERD (gastroesophageal reflux disease)    Past Surgical History  Procedure Laterality Date  . Dilation and curettage of uterus    . Cardiac catheterization    . Unilateral salpingectomy Right     due to ectopic pregnancy  . Cholecystectomy N/A 01/21/2014    Procedure: LAPAROSCOPIC CHOLECYSTECTOMY;  Surgeon: Jamesetta So, MD;  Location: [facility];  Service: General;  Laterality: N/A;  . Left heart catheterization with coronary angiogram N/A 02/08/2013    Procedure: LEFT HEART CATHETERIZATION WITH CORONARY ANGIOGRAM;  Surgeon: Burnell Blanks, MD;  Location: [facility] CATH LAB;  Service: Cardiovascular;  Laterality: N/A;   Family History  Problem Relation Age of Onset  . Hypertension Father    Social History  Substance Use Topics  . Smoking status: Never Smoker   . Smokeless tobacco: Never Used  . Alcohol Use: 8.4 oz/week    14 Cans of beer per week     Comment: 2 cans of beer daily    OB History    Gravida Para Term  Preterm AB TAB SAB Ectopic Multiple Living            0     Review of Systems  Constitutional: Negative for fever, activity change and appetite change.  HENT: Negative for congestion and rhinorrhea.   Eyes: Negative for visual disturbance.  Respiratory: Positive for chest tightness. Negative for cough and shortness of breath.   Cardiovascular: Negative for chest pain.  Gastrointestinal: Negative for vomiting and abdominal pain.  Genitourinary: Negative for dysuria, vaginal bleeding and vaginal discharge.  Musculoskeletal: Positive for myalgias, back pain and arthralgias.  Skin: Negative for wound.  Neurological: Negative for dizziness, weakness, light-headedness and headaches.  A complete 10 system review of systems was obtained and all systems are negative except as noted in the HPI and PMH.      Allergies  Review of patient's allergies indicates no known allergies.  Home Medications   Prior to Admission medications   Medication Sig Start Date End Date Taking? Authorizing Provider  aspirin EC 81 MG tablet Take 81 mg by mouth daily.   Yes Historical Provider, MD  Multiple Vitamins-Minerals (MULTIVITAMIN WITH MINERALS) tablet Take 1 tablet by mouth daily.   Yes Historical Provider, MD  acetaminophen (TYLENOL) 500 MG tablet Take 1,000 mg by mouth every 6 (six) hours as needed for pain.    Historical Provider, MD  diazepam (VALIUM) 2 MG tablet Take 1 tablet (2 mg total) by mouth 2 (two) times  daily. 09/14/15   Ezequiel Essex, MD  diphenhydrAMINE (BENADRYL) 25 MG tablet Take 50 mg by mouth 2 (two) times daily as needed for allergies.    Historical Provider, MD  HYDROcodone-acetaminophen (NORCO) 5-325 MG per tablet Take 1-2 tablets by mouth every 4 (four) hours as needed. Patient not taking: Reported on 11/22/2014 09/24/14   Nat Christen, MD  ibuprofen (ADVIL,MOTRIN) 800 MG tablet Take 1 tablet (800 mg total) by mouth 3 (three) times daily. 09/14/15   Ezequiel Essex, MD  lovastatin (MEVACOR)  10 MG tablet Take 1 tablet (10 mg total) by mouth at bedtime. Patient not taking: Reported on 09/24/2014 08/11/13   Modena Jansky, MD  promethazine-codeine [facility] WITH CODEINE) 6.25-10 MG/5ML syrup Take 5 mLs by mouth every 4 (four) hours as needed for cough (body aches and headache). Patient not taking: Reported on 09/14/2015 11/22/14   Evalee Jefferson, PA-C   BP 143/80 mmHg  Pulse 78  Temp(Src) 98 F (36.7 C) (Axillary)  Resp 16  Ht 5\' 6"  (1.676 m)  Wt 160 lb (72.576 kg)  BMI 25.84 kg/m2  SpO2 100% Physical Exam  Constitutional: She is oriented to person, place, and time. She appears well-developed and well-nourished. No distress.  Uncomfortable.  HENT:  Head: Normocephalic and atraumatic.  Mouth/Throat: Oropharynx is clear and moist. No oropharyngeal exudate.  Eyes: Conjunctivae and EOM are normal. Pupils are equal, round, and reactive to light.  Neck: Normal range of motion. Neck supple.  No meningismus.  Cardiovascular: Normal rate, regular rhythm, normal heart sounds and intact distal pulses.   No murmur heard. Pulmonary/Chest: Effort normal and breath sounds normal. No respiratory distress. She exhibits tenderness.  TTP L anterior chest wall  Abdominal: Soft. There is no tenderness. There is no rebound and no guarding.  Musculoskeletal: Normal range of motion. She exhibits tenderness. She exhibits no edema.  R paraspinal thoracic tenderness with spasm, no midline tenderness  Neurological: She is alert and oriented to person, place, and time. No cranial nerve deficit. She exhibits normal muscle tone. Coordination normal.  No ataxia on finger to nose bilaterally. No pronator drift. 5/5 strength throughout. CN 2-12 intact.Equal grip strength. Sensation intact.   Skin: Skin is warm.  Psychiatric: She has a normal mood and affect. Her behavior is normal.  Nursing note and vitals reviewed.   ED Course  Procedures (including critical care time) Labs Review Labs Reviewed  CBC  WITH DIFFERENTIAL/PLATELET - Abnormal; Notable for the following:    Hemoglobin 10.9 (*)    HCT 34.8 (*)    All other components within normal limits  BASIC METABOLIC PANEL - Abnormal; Notable for the following:    Glucose, Bld 139 (*)    All other components within normal limits  URINALYSIS, ROUTINE W REFLEX MICROSCOPIC (NOT AT [facility]) - Abnormal; Notable for the following:    Leukocytes, UA SMALL (*)    All other components within normal limits  URINE MICROSCOPIC-ADD ON - Abnormal; Notable for the following:    Squamous Epithelial / LPF 0-5 (*)    Bacteria, UA RARE (*)    All other components within normal limits  TROPONIN I  D-DIMER, QUANTITATIVE (NOT AT [facility])    Imaging Review Dg Chest 2 View  09/14/2015  CLINICAL DATA:  Posterior LEFT shoulder pain since last night, back pain, history hypertension EXAM: CHEST  2 VIEW COMPARISON:  11/22/2014 FINDINGS: Enlargement of cardiac silhouette. Mediastinal contours and pulmonary vascularity normal. Minimal chronic peribronchial thickening. Lungs otherwise clear. No pulmonary infiltrate, pleural  effusion or pneumothorax. Bones demineralized. IMPRESSION: Enlargement of cardiac silhouette. Minimal chronic bronchitic changes without infiltrate. Electronically Signed   By: Lavonia Dana M.D.   On: 09/14/2015 21:11   I have personally reviewed and evaluated these images and lab results as part of my medical decision-making.   EKG Interpretation   Date/Time:  Thursday September 14 2015 20:05:38 EST Ventricular Rate:  79 PR Interval:  174 QRS Duration: 118 QT Interval:  390 QTC Calculation: 447 R Axis:   19 Text Interpretation:  Sinus rhythm Nonspecific intraventricular conduction  delay Baseline wander in lead(s) V1 No significant change was found  Confirmed by Wyvonnia Dusky  MD, Kyara Boxer [phone]) on 09/14/2015 8:17:05 PM      MDM   Final diagnoses:  Muscle spasm  Left-sided thoracic back pain   ongoing left paraspinal midback pain since yesterday  without injury or trauma. Pain worse with palpation. Patient with no cardiac history. She had a negative catheterization in 2014. EKG is normal sinus rhythm.  Pain is atypical for ACS. EKG is unchanged. Troponin is negative. D-dimer is negative.  No rash seen on chest or back. Pain seems to be musculoskeletal. Possibility of shingles also considered. PE unlikely with negative d-dimer. Doubt ACS or aortic dissection.  Pain improved with muscle relaxers and NSAIDs. Patient cautioned to watch for development of shingles rash. Treated trichomonas and advised to have sexual partners treated as well. Return precautions discussed.  Ezequiel Essex, MD 09/14/15 2330

## 2015-09-14 NOTE — Discharge Instructions (Signed)
Muscle Cramps and Spasms There is no evidence of heart attack or blood clot in the lung. Take the medications as prescribed. Followup with your doctor. Return to the ED if you develop new or worsening symptoms. Muscle cramps and spasms occur when a muscle or muscles tighten and you have no control over this tightening (involuntary muscle contraction). They are a common problem and can develop in any muscle. The most common place is in the calf muscles of the leg. Both muscle cramps and muscle spasms are involuntary muscle contractions, but they also have differences:   Muscle cramps are sporadic and painful. They may last a few seconds to a quarter of an hour. Muscle cramps are often more forceful and last longer than muscle spasms.  Muscle spasms may or may not be painful. They may also last just a few seconds or much longer. CAUSES  It is uncommon for cramps or spasms to be due to a serious underlying problem. In many cases, the cause of cramps or spasms is unknown. Some common causes are:   Overexertion.   Overuse from repetitive motions (doing the same thing over and over).   Remaining in a certain position for a long period of time.   Improper preparation, form, or technique while performing a sport or activity.   Dehydration.   Injury.   Side effects of some medicines.   Abnormally low levels of the salts and ions in your blood (electrolytes), especially potassium and calcium. This could happen if you are taking water pills (diuretics) or you are pregnant.  Some underlying medical problems can make it more likely to develop cramps or spasms. These include, but are not limited to:   Diabetes.   Parkinson disease.   Hormone disorders, such as thyroid problems.   Alcohol abuse.   Diseases specific to muscles, joints, and bones.   Blood vessel disease where not enough blood is getting to the muscles.  HOME CARE INSTRUCTIONS   Stay well hydrated. Drink enough  water and fluids to keep your urine clear or pale yellow.  It may be helpful to massage, stretch, and relax the affected muscle.  For tight or tense muscles, use a warm towel, heating pad, or hot shower water directed to the affected area.  If you are sore or have pain after a cramp or spasm, applying ice to the affected area may relieve discomfort.  Put ice in a plastic bag.  Place a towel between your skin and the bag.  Leave the ice on for 15-20 minutes, 03-04 times a day.  Medicines used to treat a known cause of cramps or spasms may help reduce their frequency or severity. Only take over-the-counter or prescription medicines as directed by your caregiver. SEEK MEDICAL CARE IF:  Your cramps or spasms get more severe, more frequent, or do not improve over time.  MAKE SURE YOU:   Understand these instructions.  Will watch your condition.  Will get help right away if you are not doing well or get worse.   This information is not intended to replace advice given to you by your health care provider. Make sure you discuss any questions you have with your health care provider.   Document Released: 02/15/2002 Document Revised: 12/21/2012 Document Reviewed: 08/12/2012 Elsevier Interactive Patient Education [employer].

## 2015-09-25 IMAGING — CT CT ABD-PELV W/ CM
2 of 4 series · 16 of 46 positions shown, 18 images · IV contrast (Omnipaque 300)
Comparison: CT ABD - PELV W/ CM dated 10/02/2013

CLINICAL DATA: Upper abdominal pain and emesis today.

EXAM:
CT ABDOMEN AND PELVIS WITH CONTRAST
TECHNIQUE: Multidetector CT imaging of the abdomen and pelvis was performed
using the standard protocol following bolus administration of
intravenous contrast.
CONTRAST:  50mL OMNIPAQUE IOHEXOL 300 MG/ML SOLN, 100mL OMNIPAQUE
IOHEXOL 300 MG/ML SOLN

[Series 2: abd_pel_with 5.0 b40f · axial · 0.74mm/px · z∈[-482,-82]mm · 13 of 90 slices shown, 15 images]
[im 5/90  soft-tissue]
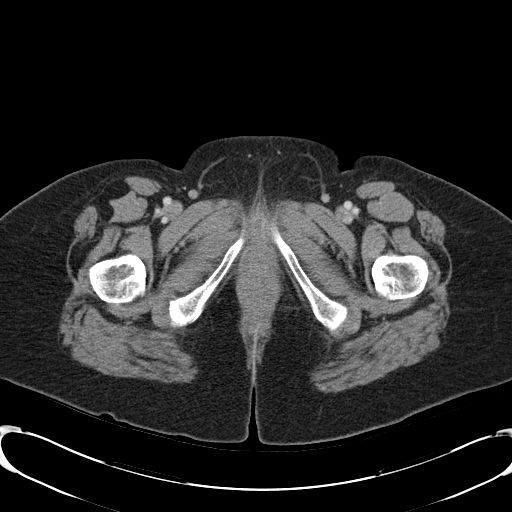
[im 5/90  bone]
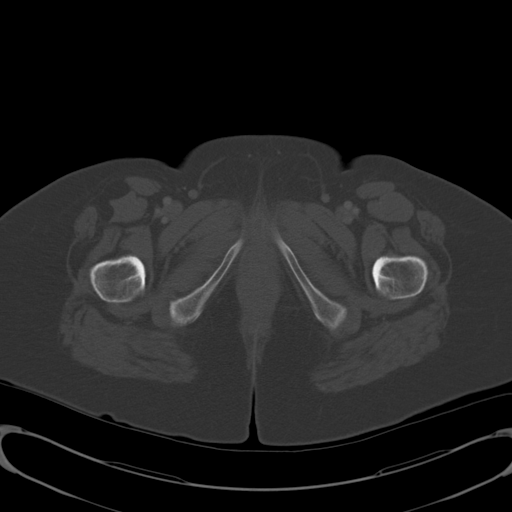
[im 13/90  soft-tissue]
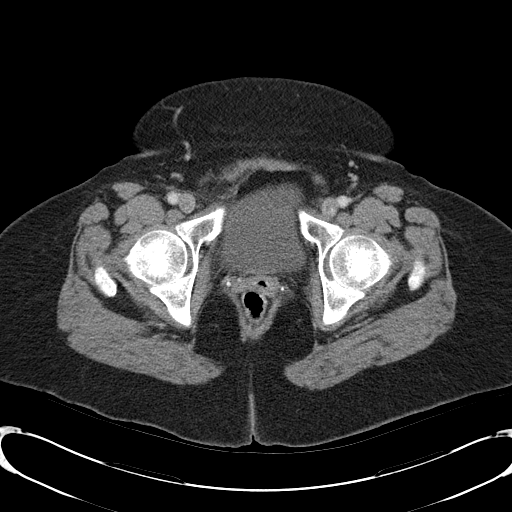
[im 21/90  soft-tissue]
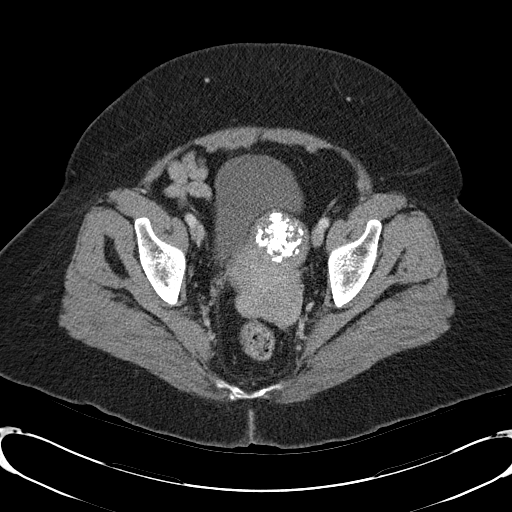
[im 25/90  soft-tissue]
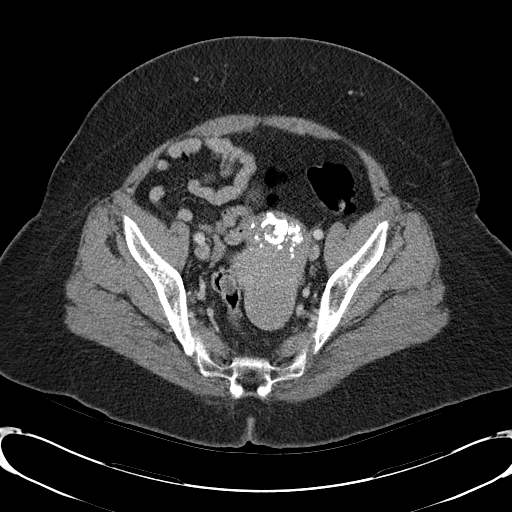
[im 33/90  soft-tissue]
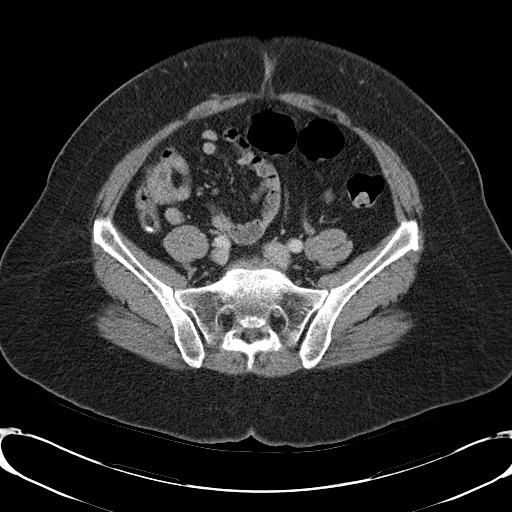
[im 37/90  soft-tissue]
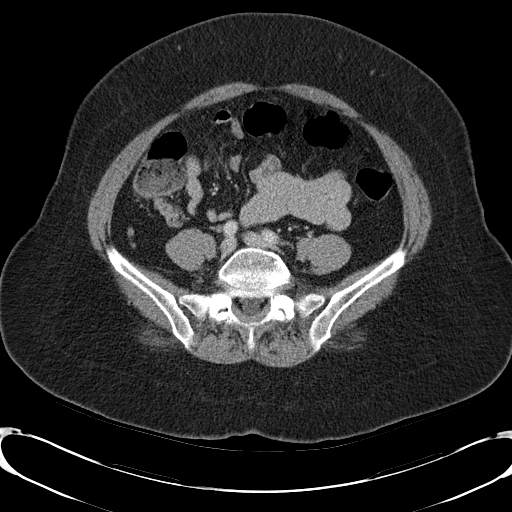
[im 45/90  soft-tissue]
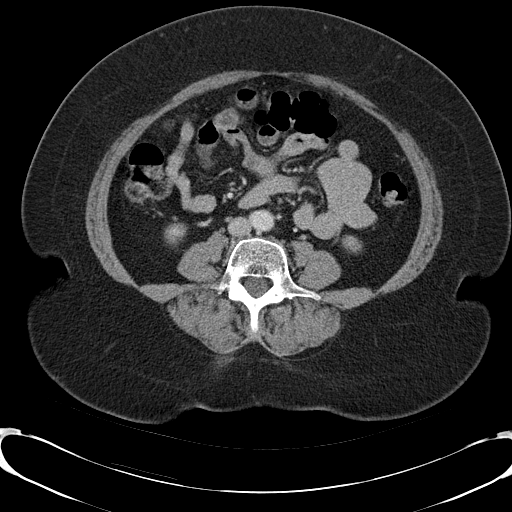
[im 53/90  soft-tissue]
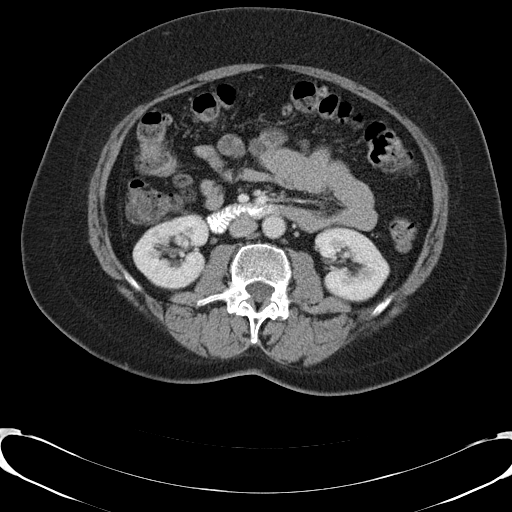
[im 57/90  soft-tissue]
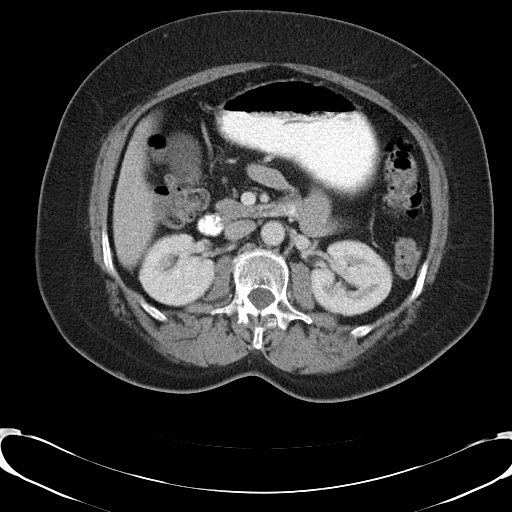
[im 57/90  bone]
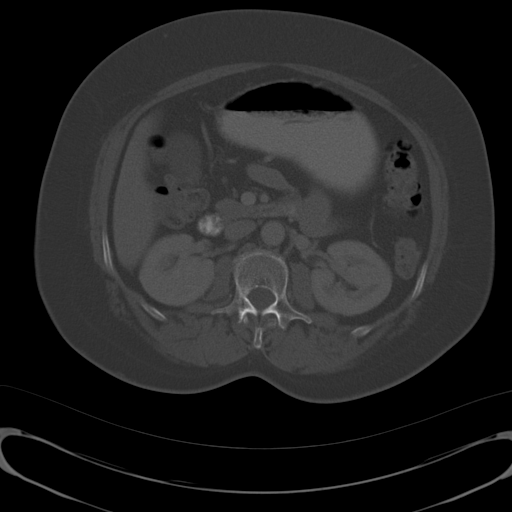
[im 65/90  soft-tissue]
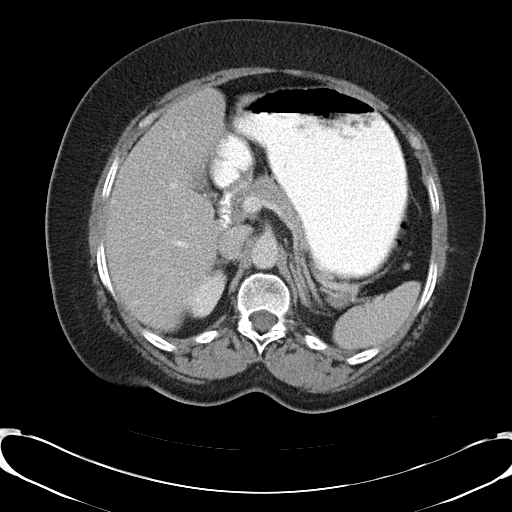
[im 69/90  soft-tissue]
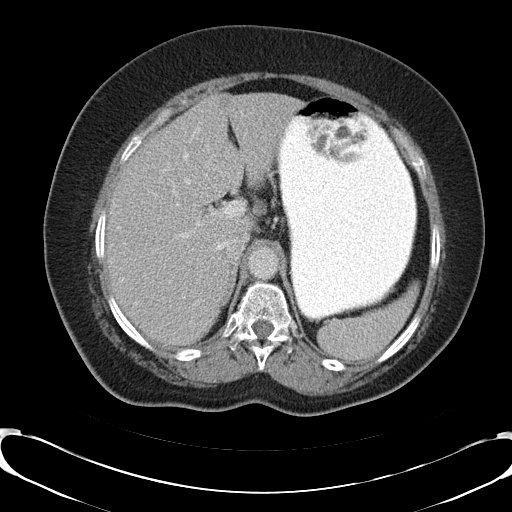
[im 77/90  soft-tissue]
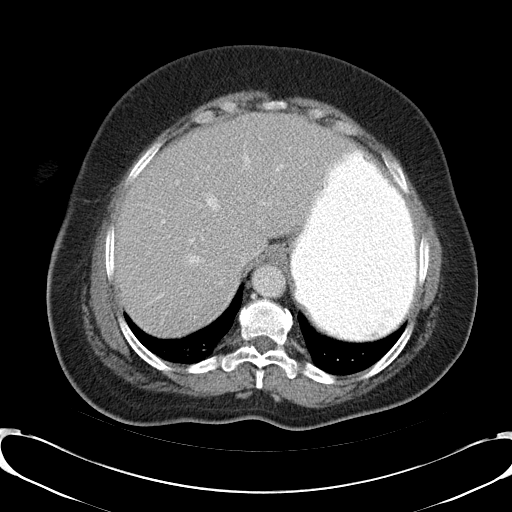
[im 85/90  soft-tissue]
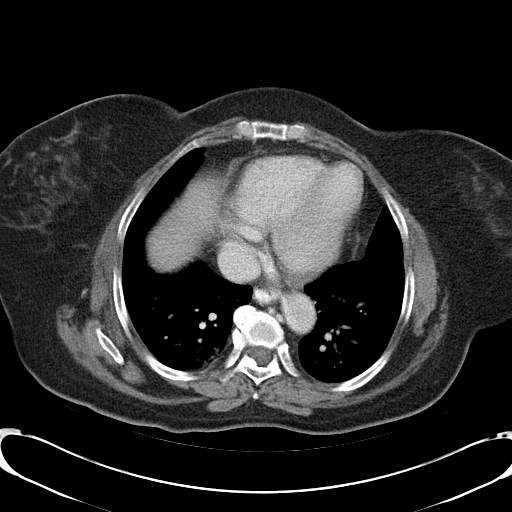

[Series 4: abd_pel_with 3.0 spo cor · coronal · 0.67mm/px · 3 of 101 slices shown]
[im 34/101  soft-tissue]
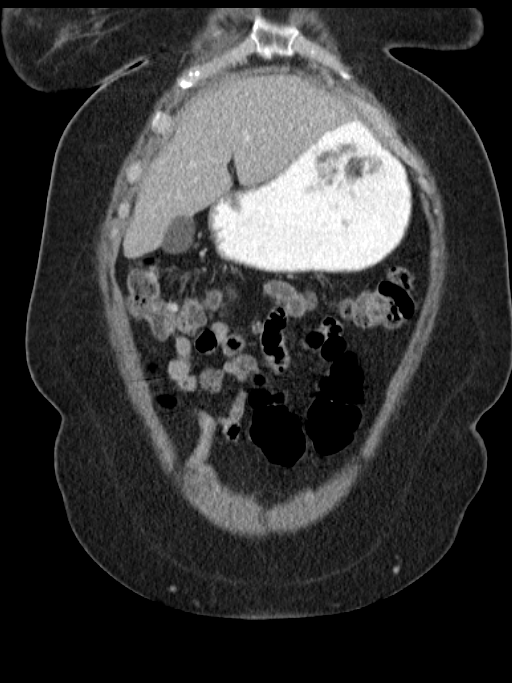
[im 45/101  soft-tissue]
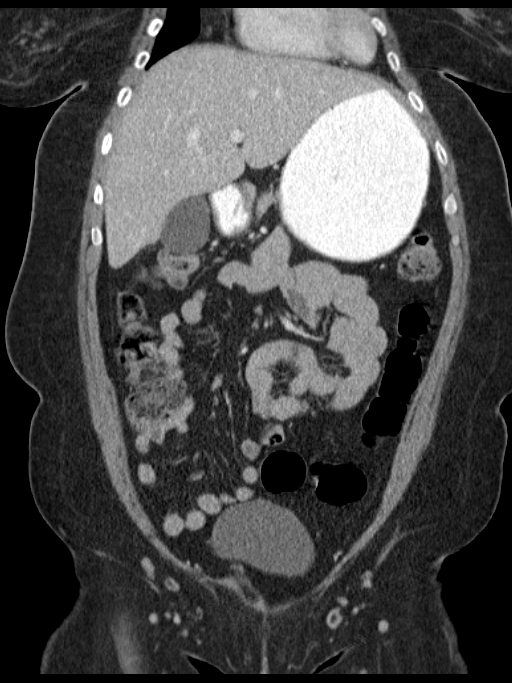
[im 56/101  soft-tissue]
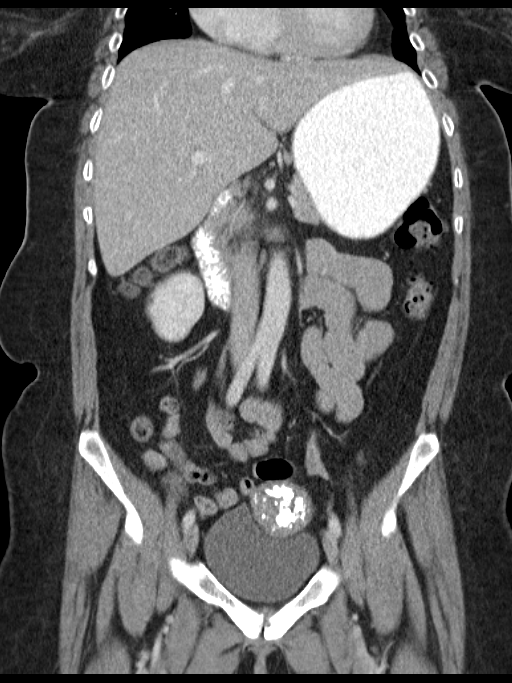

[16 of 46 positions shown; findings below may reference images not displayed]

FINDINGS: Atelectasis in the lung bases. Mild cardiac enlargement. Residual
contrast material in the lower esophagus could indicate reflux or
dysmotility. No esophageal dilatation.

Cholelithiasis without inflammatory changes in the gallbladder. The
liver, spleen, pancreas, adrenal glands, abdominal aorta, inferior
vena cava, and retroperitoneal lymph nodes are unremarkable. Duplex
collecting system on the right without evidence of hydronephrosis.
Left kidney is unremarkable. Gastric wall is not thickened. Small
bowel are decompressed. Stool in the colon without distention. No
free air or free fluid in the abdomen.

Pelvis: The appendix is normal. Calcified fibroids in the uterus. No
pelvic mass or lymphadenopathy. No diverticulitis. Bladder wall is
not thickened. No free or loculated pelvic fluid collections. No
destructive bone lesions.
IMPRESSION: No acute process demonstrated in the abdomen or pelvis.
Cholelithiasis without evidence of cholecystitis. Calcified uterine
fibroids. Duplex collecting system in the right kidney. No change
since prior study.

## 2015-09-25 IMAGING — US US ABDOMEN COMPLETE
2 series · 13 of 25 positions shown · non-contrast
Comparison: CT ABD - PELV W/ CM dated 01/19/2014

CLINICAL DATA: Upper abdominal pain with vomiting ; earlier CT
scans suggest gallstones

EXAM:
ULTRASOUND ABDOMEN COMPLETE

[Series 1: us abdomen complete · 0.24mm/px · 10 of 81 slices shown (1 of 2)]
[im 1/81]
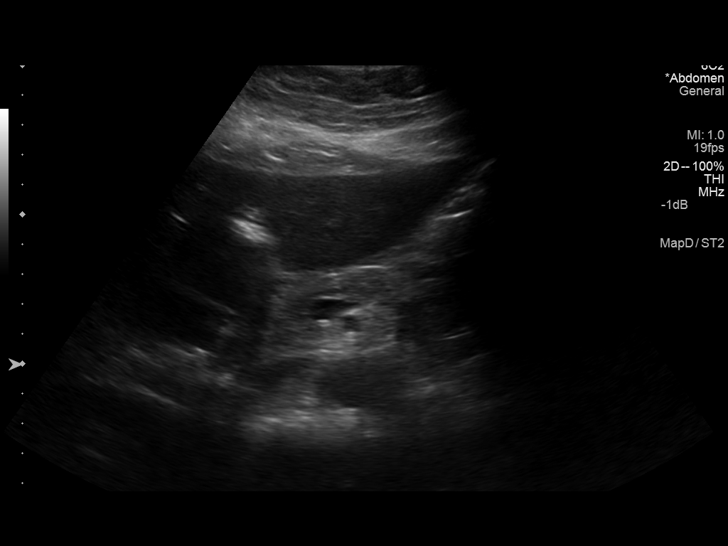
[im 9/81]
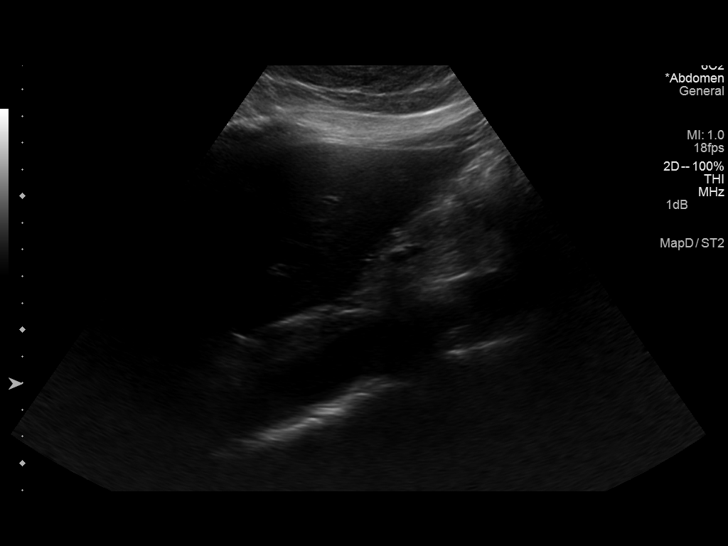
[im 17/81]
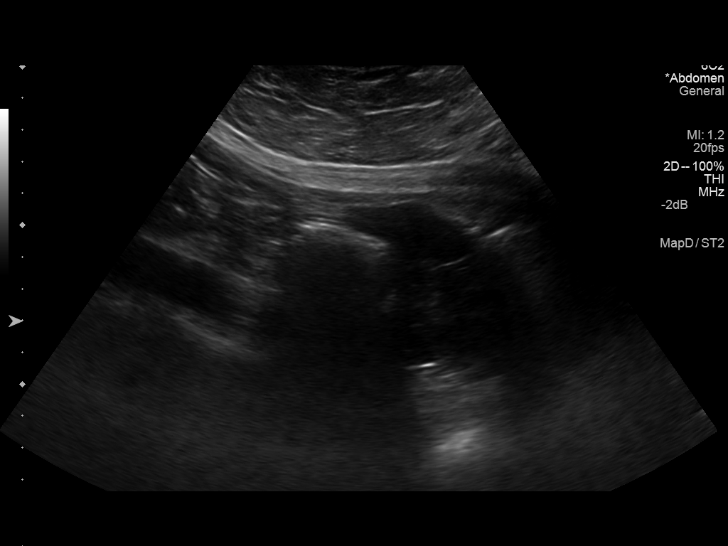
[im 26/81]
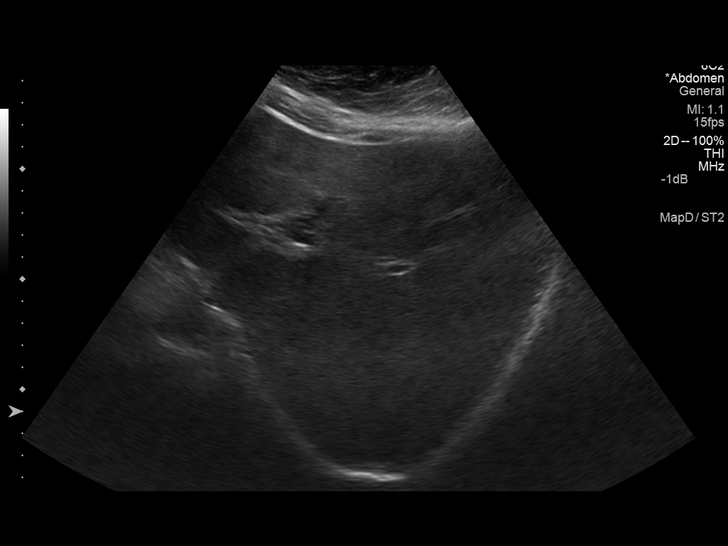
[im 34/81]
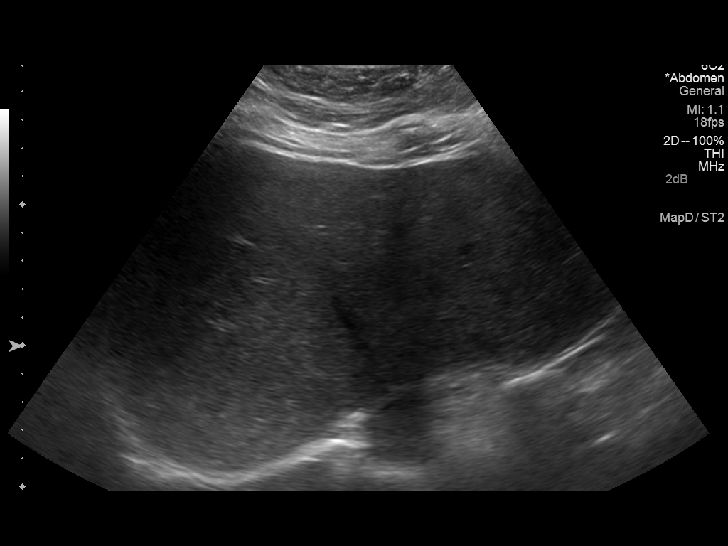
[im 43/81]
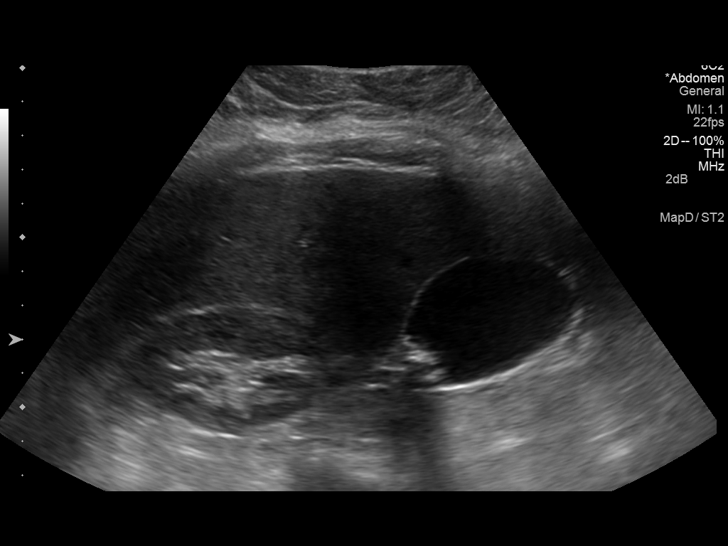
[im 51/81]
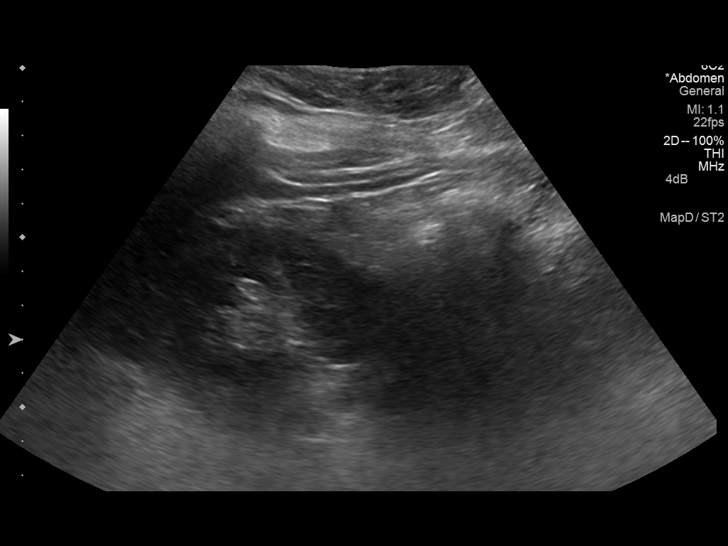
[im 59/81]
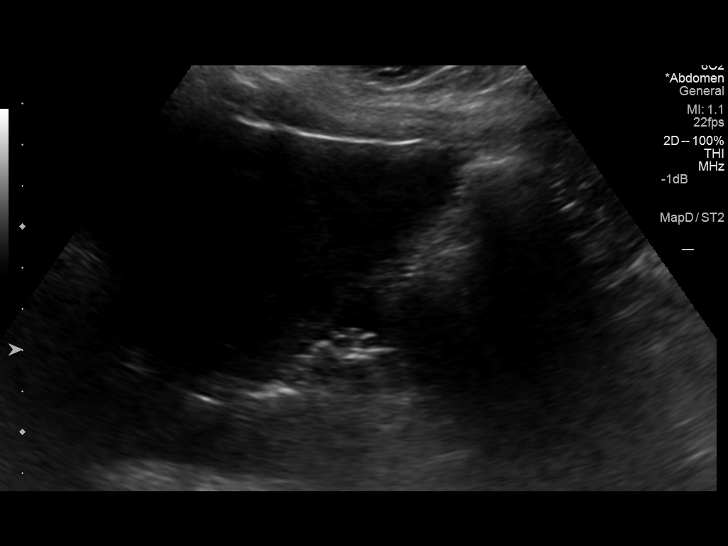
[im 68/81]
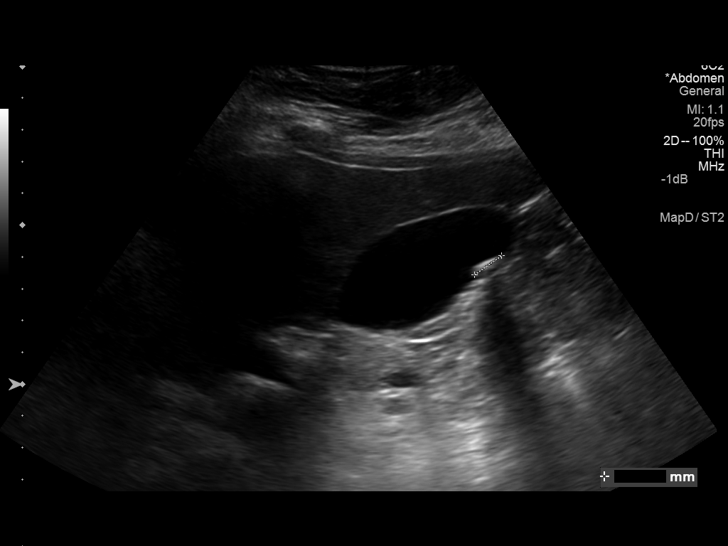
[im 76/81]
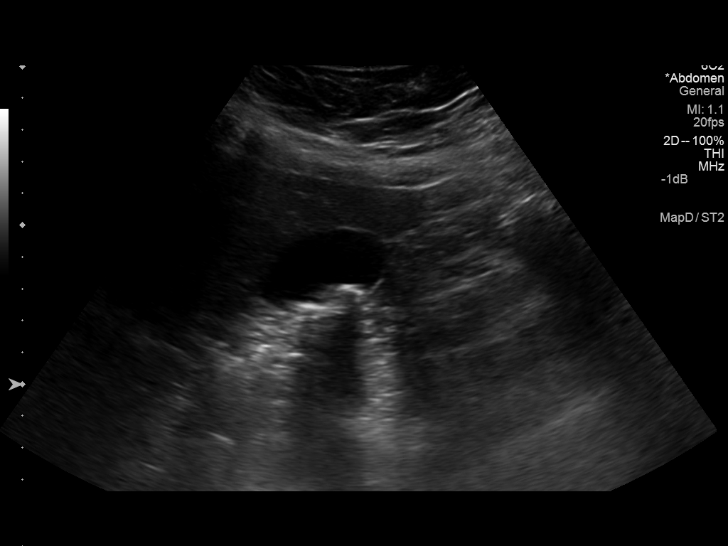

[Series 1: us abdomen complete · 0.17mm/px · 3 of 19 slices shown (2 of 2)]
[im 1/19]
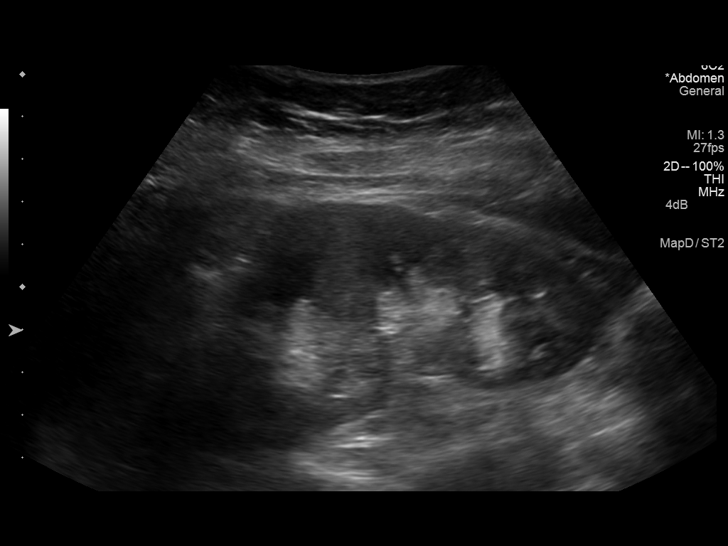
[im 10/19]
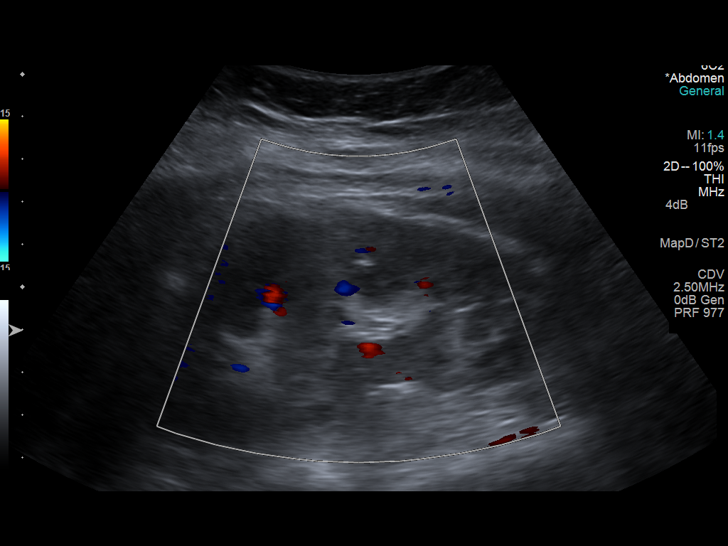
[im 19/19]
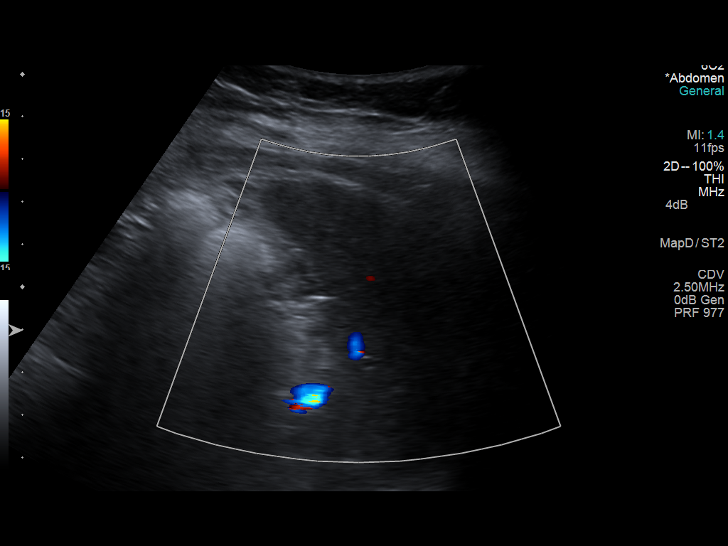

[13 of 25 positions shown; findings below may reference images not displayed]

FINDINGS: Gallbladder:

The gallbladder is adequately distended and contains multiple
echogenic mobile shadowing stones. The gallbladder wall. Measures
under 2 mm in thickness. There is a positive sonographic Murphy's
sign.

Common bile duct:

Diameter: 3.8 mm

Liver:

No focal lesion identified. Within normal limits in parenchymal
echogenicity.

IVC:

No abnormality visualized.

Pancreas:

Bowel gas limits evaluation of the pancreas.

Spleen:

Size and appearance within normal limits.

Right Kidney:

Length: 12.2 cm. Echogenicity within normal limits. No mass or
hydronephrosis visualized.

Left Kidney:

Length: 10.2 cm. Echogenicity within normal limits. No mass or
hydronephrosis visualized.

Abdominal aorta:

No aneurysm visualized.

Other findings:

None.
IMPRESSION: 1. There are multiple mobile gallstones present. There is a positive
sonographic Murphy's sign. The findings suggest acute cholecystitis
associated with cholelithiasis.
2. The liver and common bile duct and spleen are normal in
appearance. The visualized portions of the pancreas are normal but
bowel gas severely limits the study.

## 2015-10-18 ENCOUNTER — Ambulatory Visit (INDEPENDENT_AMBULATORY_CARE_PROVIDER_SITE_OTHER): Payer: BLUE CROSS/BLUE SHIELD

## 2015-10-18 ENCOUNTER — Ambulatory Visit (INDEPENDENT_AMBULATORY_CARE_PROVIDER_SITE_OTHER): Payer: BLUE CROSS/BLUE SHIELD | Admitting: Orthopaedic Surgery

## 2015-10-18 ENCOUNTER — Encounter: Payer: Self-pay | Admitting: Orthopaedic Surgery

## 2015-10-18 VITALS — BP 167/99 | HR 99 | Temp 97.0°F | Resp 18 | Ht 66.0 in | Wt 178.0 lb

## 2015-10-18 DIAGNOSIS — M25562 Pain in left knee: Secondary | ICD-10-CM | POA: Insufficient documentation

## 2015-10-18 DIAGNOSIS — M25561 Pain in right knee: Secondary | ICD-10-CM | POA: Diagnosis not present

## 2015-10-18 MED ORDER — HYDROCODONE-ACETAMINOPHEN 5-325 MG PO TABS: 1.0000 | ORAL_TABLET | ORAL | Status: DC | PRN

## 2015-10-18 MED ORDER — NAPROXEN 500 MG PO TABS: 500.0000 mg | ORAL_TABLET | Freq: Two times a day (BID) | ORAL | Status: DC

## 2015-10-18 NOTE — Progress Notes (Signed)
Patient [id number] Kendra Valenzuela, female DOB:1961/06/11, 55 y.o. [id number]  Chief Complaint  Patient presents with  . Leg Pain    bilateral leg pain    HPI  Kendra Valenzuela is a 55 y.o. female has a several week history of bilateral knee pain, more on the right than the left.   She has no trauma, no redness, no giving way, no locking.  She has swelling of the right knee and popping.  She tried Tylenol with no help.     Knee Pain  The pain is present in the left knee and right knee. The quality of the pain is described as aching. The pain is at a severity of 3/10. The pain is mild. The pain has been fluctuating since onset. She has tried acetaminophen, heat and ice for the symptoms. The treatment provided no relief.    BMI is 28.73  Review of Systems  Patient does not have Diabetes Mellitus. Patient has hypertension. Patient does not have COPD or shortness of breath. Patient does not have BMI > 35. Patient does not have current smoking history.  Review of Systems  Past Medical History  Diagnosis Date  . Environmental allergies   . Back pain   . Chest pain 01/2013    Admitted to [facility] in 01/2013  . Hypertension 01/13/2013  . Headache(784.0)   . Hypercholesteremia   . GERD (gastroesophageal reflux disease)     Past Surgical History  Procedure Laterality Date  . Dilation and curettage of uterus    . Cardiac catheterization    . Unilateral salpingectomy Right     due to ectopic pregnancy  . Cholecystectomy N/A 01/21/2014    Procedure: LAPAROSCOPIC CHOLECYSTECTOMY;  Surgeon: Jamesetta So, MD;  Location: [facility];  Service: General;  Laterality: N/A;  . Left heart catheterization with coronary angiogram N/A 02/08/2013    Procedure: LEFT HEART CATHETERIZATION WITH CORONARY ANGIOGRAM;  Surgeon: Burnell Blanks, MD;  Location: [facility] CATH LAB;  Service: Cardiovascular;  Laterality: N/A;    Family History  Problem Relation Age of Onset  . Hypertension Father     Social  History Social History  Substance Use Topics  . Smoking status: Never Smoker   . Smokeless tobacco: Never Used  . Alcohol Use: 8.4 oz/week    14 Cans of beer per week     Comment: 2 cans of beer daily     No Known Allergies  Current Outpatient Prescriptions  Medication Sig Dispense Refill  . acetaminophen (TYLENOL) 500 MG tablet Take 1,000 mg by mouth every 6 (six) hours as needed for pain.    Marland Kitchen aspirin EC 81 MG tablet Take 81 mg by mouth daily.    . diazepam (VALIUM) 2 MG tablet Take 1 tablet (2 mg total) by mouth 2 (two) times daily. 6 tablet 0  . diphenhydrAMINE (BENADRYL) 25 MG tablet Take 50 mg by mouth 2 (two) times daily as needed for allergies.    Marland Kitchen HYDROcodone-acetaminophen (NORCO) 5-325 MG per tablet Take 1-2 tablets by mouth every 4 (four) hours as needed. (Patient not taking: Reported on 11/22/2014) 20 tablet 0  . ibuprofen (ADVIL,MOTRIN) 800 MG tablet Take 1 tablet (800 mg total) by mouth 3 (three) times daily. 21 tablet 0  . lovastatin (MEVACOR) 10 MG tablet Take 1 tablet (10 mg total) by mouth at bedtime. (Patient not taking: Reported on 09/24/2014) 30 tablet 0  . Multiple Vitamins-Minerals (MULTIVITAMIN WITH MINERALS) tablet Take 1 tablet by mouth daily.    Marland Kitchen  promethazine-codeine (PHENERGAN WITH CODEINE) 6.25-10 MG/5ML syrup Take 5 mLs by mouth every 4 (four) hours as needed for cough (body aches and headache). (Patient not taking: Reported on 09/14/2015) 120 mL 0   No current facility-administered medications for this visit.     Physical Exam  Height 5\' 6"  (1.676 m).  Constitutional: overall normal hygiene, normal nutrition, well developed, normal grooming, normal body habitus. Assistive device:none  Musculoskeletal: gait and station Limp to the right, muscle tone and strength are normal, no tremors or atrophy is present.  .  Neurological: coordination overall normal.  Deep tendon reflex/nerve stretch intact.  Sensation normal.  Cranial nerves II-XII intact.    Skin:normal  overall no scars, lesions, ulcers or rash es. No psoriasis.  Psychiatric: Alert and oriented x 3.  Recent memory intact, remote memory unclear.  Normal mood and affect. Well groomed.  Good eye contact.  Cardiovascular: overall no swelling, no varicosities, no edema bilaterally, normal temperatures of the legs and arms, no clubbing, cyanosis and good capillary refill.  Lymphatic: palpation is normal.   Extremities:both knees are tender, the right more than the left with effusion of the right knee 2+, the left knee trace effusion.  Color is normal. Inspection normal Strength and tone normal Range of motion right knee 0 to 105 with crepitus, the left knee 0 to 110 with crepitus.  Additional services performed: x-rays of both knees preformed in the office and reviewed here.  PLAN Call if any problems.  Precautions discussed.  Continue current medications.   Return to clinic 2 wks

## 2015-10-18 NOTE — Patient Instructions (Addendum)
We will send the arthritis medicine to the pharmacy Take this after you eat a meal You will also be given a prescription for pain medicine you can not drive or operate heavy equipment whiile taking this

## 2015-11-01 ENCOUNTER — Ambulatory Visit (INDEPENDENT_AMBULATORY_CARE_PROVIDER_SITE_OTHER): Payer: BLUE CROSS/BLUE SHIELD | Admitting: Orthopaedic Surgery

## 2015-11-01 ENCOUNTER — Encounter: Payer: Self-pay | Admitting: Orthopaedic Surgery

## 2015-11-01 VITALS — BP 178/97 | HR 96 | Temp 98.1°F | Ht 66.0 in | Wt 177.8 lb

## 2015-11-01 DIAGNOSIS — M25562 Pain in left knee: Secondary | ICD-10-CM | POA: Diagnosis not present

## 2015-11-01 DIAGNOSIS — M25561 Pain in right knee: Secondary | ICD-10-CM | POA: Diagnosis not present

## 2015-11-01 NOTE — Patient Instructions (Signed)
Continue medicines.  Continue activity.  Call if any problem.

## 2015-11-01 NOTE — Progress Notes (Signed)
Patient [id number] Kendra Valenzuela, female DOB:02/17/1961, 55 y.o. [id number]  Chief Complaint  Patient presents with  . Follow-up    Bilateral knee pain s/p medication    HPI  Kendra Valenzuela is a 55 y.o. female who has chronic pain in both knees. She has no new trauma, no redness, no giving way, no locking.  She is taking her medicine.   Knee Pain  The pain is present in the left knee and right knee. The quality of the pain is described as aching. The pain is at a severity of 3/10. The pain is mild. The pain has been fluctuating since onset. The symptoms are aggravated by weight bearing. She has tried rest, NSAIDs, ice and heat for the symptoms. The treatment provided moderate relief.    Body mass index is 28.71 kg/(m^2).   Review of Systems  Constitutional:       Patient does not have Diabetes Mellitus. Patient has hypertension. Patient does not have COPD or shortness of breath. Patient does not have BMI > 35. Patient does not have current smoking history.  Musculoskeletal: Positive for joint swelling, arthralgias and gait problem.    Past Medical History  Diagnosis Date  . Environmental allergies   . Back pain   . Chest pain 01/2013    Admitted to [facility] in 01/2013  . Hypertension 01/13/2013  . Headache(784.0)   . Hypercholesteremia   . GERD (gastroesophageal reflux disease)     Past Surgical History  Procedure Laterality Date  . Dilation and curettage of uterus    . Cardiac catheterization    . Unilateral salpingectomy Right     due to ectopic pregnancy  . Cholecystectomy N/A 01/21/2014    Procedure: LAPAROSCOPIC CHOLECYSTECTOMY;  Surgeon: Jamesetta So, MD;  Location: [facility];  Service: General;  Laterality: N/A;  . Left heart catheterization with coronary angiogram N/A 02/08/2013    Procedure: LEFT HEART CATHETERIZATION WITH CORONARY ANGIOGRAM;  Surgeon: Burnell Blanks, MD;  Location: [facility] CATH LAB;  Service: Cardiovascular;  Laterality: N/A;    Family  History  Problem Relation Age of Onset  . Hypertension Father     Social History Social History  Substance Use Topics  . Smoking status: Never Smoker   . Smokeless tobacco: Never Used  . Alcohol Use: 8.4 oz/week    14 Cans of beer per week     Comment: 2 cans of beer daily     No Known Allergies  Current Outpatient Prescriptions  Medication Sig Dispense Refill  . acetaminophen (TYLENOL) 500 MG tablet Take 1,000 mg by mouth every 6 (six) hours as needed for pain.    Marland Kitchen aspirin EC 81 MG tablet Take 81 mg by mouth daily.    . diphenhydrAMINE (BENADRYL) 25 MG tablet Take 50 mg by mouth 2 (two) times daily as needed for allergies.    Marland Kitchen HYDROcodone-acetaminophen (NORCO/VICODIN) 5-325 MG tablet Take 1 tablet by mouth every 4 (four) hours as needed for moderate pain (Must last 30 days.  Do not take and drive a car or use machinery.). 120 tablet 0  . Multiple Vitamins-Minerals (MULTIVITAMIN WITH MINERALS) tablet Take 1 tablet by mouth daily.    . naproxen (NAPROSYN) 500 MG tablet Take 1 tablet (500 mg total) by mouth 2 (two) times daily with a meal. 60 tablet 5  . promethazine-codeine (PHENERGAN WITH CODEINE) 6.25-10 MG/5ML syrup Take 5 mLs by mouth every 4 (four) hours as needed for cough (body aches and headache). 120 mL 0  No current facility-administered medications for this visit.     Physical Exam  Blood pressure 178/97, pulse 96, temperature 98.1 F (36.7 C), height 5\' 6"  (1.676 m), weight 177 lb 12.8 oz (80.65 kg).  Constitutional: overall normal hygiene, normal nutrition, well developed, normal grooming, normal body habitus. Assistive device:none  Musculoskeletal: gait and station Limp right, muscle tone and strength are normal, no tremors or atrophy is present.  .  Neurological: coordination overall normal.  Deep tendon reflex/nerve stretch intact.  Sensation normal.  Cranial nerves II-XII intact.   Skin:   normal overall no scars, lesions, ulcers or rashes. No  psoriasis.  Psychiatric: Alert and oriented x 3.  Recent memory intact, remote memory unclear.  Normal mood and affect. Well groomed.  Good eye contact.  Cardiovascular: overall no swelling, no varicosities, no edema bilaterally, normal temperatures of the legs and arms, no clubbing, cyanosis and good capillary refill.  Lymphatic: palpation is normal.  The right lower extremity is examined:  Inspection:  Thigh:  Non-tender and no defects  Knee has swelling 1+ effusion.                        Joint tenderness is present                        Patient is tender over the medial joint line  Lower Leg:  Has normal appearance and no tenderness or defects  Ankle:  Non-tender and no defects  Foot:  Non-tender and no defects Range of Motion:  Knee:  Range of motion is: 0-100                        Crepitus is  present  Ankle:  Range of motion is normal. Strength and Tone:  The right lower extremity has normal strength and tone. Stability:  Knee:  The knee is stable.  Ankle:  The ankle is stable.  The left lower extremity is examined:  Inspection:  Thigh:  Non-tender and no defects  Knee has swelling 1+ effusion.                        Joint tenderness is present                        Patient is tender over the medial joint line  Lower Leg:  Has normal appearance and no tenderness or defects  Ankle:  Non-tender and no defects  Foot:  Non-tender and no defects Range of Motion:  Knee:  Range of motion is: 0-105                        Crepitus is  present  Ankle:  Range of motion is normal. Strength and Tone:  The left lower extremity has normal strength and tone. Stability:  Knee:  The knee is stable.  Ankle:  The ankle is stable.    Extremities:The upper extremities are normal Inspection normal Strength and tone normal Range of motion full  Additional services performed: none  The patient has been educated about the nature of the problem(s) and counseled on treatment  options.  The patient appeared to understand what I have discussed and is in agreement with it.  PLAN Call if any problems.  Precautions discussed.  Continue current medications.   Return to clinic 1 month

## 2015-11-15 ENCOUNTER — Telehealth: Payer: Self-pay | Admitting: Orthopaedic Surgery

## 2015-11-15 MED ORDER — HYDROCODONE-ACETAMINOPHEN 5-325 MG PO TABS: 1.0000 | ORAL_TABLET | ORAL | Status: DC | PRN

## 2015-11-15 NOTE — Telephone Encounter (Signed)
Patient is requesting refill of Hydrocodone 5/325mg  Qty 120 Tablets

## 2015-11-15 NOTE — Telephone Encounter (Signed)
Rx done. 

## 2015-11-29 ENCOUNTER — Ambulatory Visit: Payer: BLUE CROSS/BLUE SHIELD | Admitting: Orthopaedic Surgery

## 2015-11-30 ENCOUNTER — Encounter: Payer: Self-pay | Admitting: Orthopaedic Surgery

## 2015-11-30 ENCOUNTER — Ambulatory Visit (INDEPENDENT_AMBULATORY_CARE_PROVIDER_SITE_OTHER): Payer: BLUE CROSS/BLUE SHIELD | Admitting: Orthopaedic Surgery

## 2015-11-30 VITALS — BP 157/101 | HR 81 | Temp 97.7°F | Resp 16 | Ht 66.0 in | Wt 182.0 lb

## 2015-11-30 DIAGNOSIS — M25562 Pain in left knee: Secondary | ICD-10-CM

## 2015-11-30 DIAGNOSIS — M25561 Pain in right knee: Secondary | ICD-10-CM | POA: Diagnosis not present

## 2015-11-30 NOTE — Progress Notes (Signed)
Kendra Valenzuela, female DOB:Aug 15, 1961, 55 y.o. [id number]  Chief Complaint  Kendra presents with  . Follow-up    follow up bilateral knees "worse"    HPI  Kendra Valenzuela is a 55 y.o. female who is being seen for follow-up for her bilateral knee pain.  They are better.  She has more pain of the right lower leg but the knees are improved. She has bilateral knee swelling and popping but no giving way, no redness, no locking.  She is active and taking her medicine.  She has no new trauma.She has no redness of the right lower leg.  I told her to use over the counter Aspercreme for the leg.  HPI  Body mass index is 29.39 kg/(m^2).   Review of Systems  Constitutional:       Kendra does not have Diabetes Mellitus. Kendra has hypertension. Kendra does not have COPD or shortness of breath. Kendra does not have BMI > 35. Kendra does not have current smoking history.  HENT: Positive for congestion.   Respiratory: Positive for shortness of breath.   Cardiovascular: Negative for chest pain.  Endocrine: Positive for cold intolerance.  Musculoskeletal: Positive for joint swelling, arthralgias and gait problem.    Past Medical History  Diagnosis Date  . Environmental allergies   . Back pain   . Chest pain 01/2013    Admitted to [facility] in 01/2013  . Hypertension 01/13/2013  . Headache(784.0)   . Hypercholesteremia   . GERD (gastroesophageal reflux disease)     Past Surgical History  Procedure Laterality Date  . Dilation and curettage of uterus    . Cardiac catheterization    . Unilateral salpingectomy Right     due to ectopic pregnancy  . Cholecystectomy N/A 01/21/2014    Procedure: LAPAROSCOPIC CHOLECYSTECTOMY;  Surgeon: Jamesetta So, MD;  Location: [facility];  Service: General;  Laterality: N/A;  . Left heart catheterization with coronary angiogram N/A 02/08/2013    Procedure: LEFT HEART CATHETERIZATION WITH CORONARY ANGIOGRAM;  Surgeon: Burnell Blanks,  MD;  Location: [facility] CATH LAB;  Service: Cardiovascular;  Laterality: N/A;    Family History  Problem Relation Age of Onset  . Hypertension Father     Social History Social History  Substance Use Topics  . Smoking status: Never Smoker   . Smokeless tobacco: Never Used  . Alcohol Use: 8.4 oz/week    14 Cans of beer per week     Comment: 2 cans of beer daily     No Known Allergies  Current Outpatient Prescriptions  Medication Sig Dispense Refill  . acetaminophen (TYLENOL) 500 MG tablet Take 1,000 mg by mouth every 6 (six) hours as needed for pain.    Marland Kitchen aspirin EC 81 MG tablet Take 81 mg by mouth daily.    . diphenhydrAMINE (BENADRYL) 25 MG tablet Take 50 mg by mouth 2 (two) times daily as needed for allergies.    Marland Kitchen HYDROcodone-acetaminophen (NORCO/VICODIN) 5-325 MG tablet Take 1 tablet by mouth every 4 (four) hours as needed for moderate pain (Must last 30 days.  Do not take and drive a car or use machinery.). 120 tablet 0  . Multiple Vitamins-Minerals (MULTIVITAMIN WITH MINERALS) tablet Take 1 tablet by mouth daily.    . naproxen (NAPROSYN) 500 MG tablet Take 1 tablet (500 mg total) by mouth 2 (two) times daily with a meal. 60 tablet 5   No current facility-administered medications for this visit.     Physical Exam  Blood pressure 157/101, pulse 81, temperature 97.7 F (36.5 C), resp. rate 16, height 5\' 6"  (1.676 m), weight 182 lb (82.555 kg).  Constitutional: overall normal hygiene, normal nutrition, well developed, normal grooming, normal body habitus. Assistive device:none  Musculoskeletal: gait and station Limp right, muscle tone and strength are normal, no tremors or atrophy is present.  .  Neurological: coordination overall normal.  Deep tendon reflex/nerve stretch intact.  Sensation normal.  Cranial nerves II-XII intact.   Skin:   normal overall no scars, lesions, ulcers or rashes. No psoriasis.  Psychiatric: Alert and oriented x 3.  Recent memory intact, remote  memory unclear.  Normal mood and affect. Well groomed.  Good eye contact.  Cardiovascular: overall no swelling, no varicosities, no edema bilaterally, normal temperatures of the legs and arms, no clubbing, cyanosis and good capillary refill.  Lymphatic: palpation is normal.  The bilateral lower extremity is examined:  Inspection:  Thigh:  Non-tender and no defects  Knee has swelling 1+ effusion bilaterally                        Joint tenderness is present                        Kendra is tender over the medial joint line  Lower Leg:  Has normal appearance and no tenderness or defects  Ankle:  Non-tender and no defects  Foot:  Non-tender and no defects Range of Motion:  Knee:  Range of motion is: 0-105 right, 0-110 left.                        Crepitus is  present  Ankle:  Range of motion is normal. Strength and Tone:  The bilateral lower extremity has normal strength and tone. Stability:  Knee:  The knee is stable.  Ankle:  The ankle is stable.   The Kendra has been educated about the nature of the problem(s) and counseled on treatment options.  The Kendra appeared to understand what I have discussed and is in agreement with it.  Encounter Diagnoses  Name Primary?  . Right knee pain Yes  . Left knee pain     PLAN Call if any problems.  Precautions discussed.  Continue current medications.   Return to clinic 2 months

## 2015-12-07 ENCOUNTER — Emergency Department (HOSPITAL_COMMUNITY)
Admission: EM | Admit: 2015-12-07 | Discharge: 2015-12-07 | Disposition: A | Discharge status: Home / Self Care | Payer: BLUE CROSS/BLUE SHIELD | Attending: Emergency Medicine | Admitting: Emergency Medicine

## 2015-12-07 ENCOUNTER — Emergency Department (HOSPITAL_COMMUNITY): Payer: BLUE CROSS/BLUE SHIELD

## 2015-12-07 ENCOUNTER — Encounter (HOSPITAL_COMMUNITY): Payer: Self-pay | Admitting: Cardiology

## 2015-12-07 DIAGNOSIS — Z7982 Long term (current) use of aspirin: Secondary | ICD-10-CM | POA: Insufficient documentation

## 2015-12-07 DIAGNOSIS — R0789 Other chest pain: Secondary | ICD-10-CM | POA: Diagnosis present

## 2015-12-07 DIAGNOSIS — I10 Essential (primary) hypertension: Secondary | ICD-10-CM | POA: Diagnosis not present

## 2015-12-07 LAB — HEPATIC FUNCTION PANEL
ALK PHOS: 92 U/L (ref 38–126)
ALT: 15 U/L (ref 14–54)
AST: 19 U/L (ref 15–41)
Albumin: 4.3 g/dL (ref 3.5–5.0)
BILIRUBIN DIRECT: 0.1 mg/dL (ref 0.1–0.5)
BILIRUBIN TOTAL: 0.5 mg/dL (ref 0.3–1.2)
Indirect Bilirubin: 0.4 mg/dL (ref 0.3–0.9)
Total Protein: 7.4 g/dL (ref 6.5–8.1)

## 2015-12-07 LAB — BASIC METABOLIC PANEL
Anion gap: 9 (ref 5–15)
BUN: 17 mg/dL (ref 6–20)
CHLORIDE: 105 mmol/L (ref 101–111)
CO2: 28 mmol/L (ref 22–32)
Calcium: 8.6 mg/dL — ABNORMAL LOW (ref 8.9–10.3)
Creatinine, Ser: 0.74 mg/dL (ref 0.44–1.00)
GFR calc Af Amer: >60 mL/min (ref >60)
GLUCOSE: 164 mg/dL — AB (ref 65–99)
POTASSIUM: 3.3 mmol/L — AB (ref 3.5–5.1)
Sodium: 142 mmol/L (ref 135–145)

## 2015-12-07 LAB — CBC
HEMATOCRIT: 36.2 % (ref 36.0–46.0)
Hemoglobin: 11.4 g/dL — ABNORMAL LOW (ref 12.0–15.0)
MCH: 27.9 pg (ref 26.0–34.0)
MCHC: 31.5 g/dL (ref 30.0–36.0)
MCV: 88.5 fL (ref 78.0–100.0)
Platelets: 278 10*3/uL (ref 150–400)
RBC: 4.09 MIL/uL (ref 3.87–5.11)
RDW: 14.4 % (ref 11.5–15.5)
WBC: 8.6 10*3/uL (ref 4.0–10.5)

## 2015-12-07 LAB — TROPONIN I
Troponin I: <0.03 ng/mL (ref <0.031)
Troponin I: <0.03 ng/mL (ref <0.031)

## 2015-12-07 LAB — LIPASE, BLOOD: LIPASE: 31 U/L (ref 11–51)

## 2015-12-07 MED ORDER — HYDROCODONE-ACETAMINOPHEN 5-325 MG PO TABS
1.0000 | ORAL_TABLET | Freq: Once | ORAL | Status: AC
  Administered 2015-12-07: 1 via ORAL
  Filled 2015-12-07: qty 1

## 2015-12-07 NOTE — ED Provider Notes (Signed)
CSN: [id number]     Arrival date & time 12/07/15  1157 History  By signing my name below, I, Stephania Fragmin, attest that this documentation has been prepared under the direction and in the presence of Milton Ferguson, MD. Electronically Signed: Stephania Fragmin, ED Scribe. 12/07/2015. 12:29 PM.     Chief Complaint  Patient presents with  . Chest Pain   Patient is a 55 y.o. female presenting with chest pain. The history is provided by the patient. No language interpreter was used.  Chest Pain Pain location:  R chest Pain radiates to:  Upper back, mid back and R arm Pain radiates to the back: yes   Pain severity:  Moderate Timing:  Constant Chronicity:  Recurrent Relieved by:  None tried Worsened by:  Nothing tried Ineffective treatments:  None tried Associated symptoms: no abdominal pain, no back pain, no cough, no fatigue, no headache and no shortness of breath     HPI Comments: KAHNIYA OVERBAY is a 55 y.o. female with a history of hypertension, hyperlipidemia, cardiac catheterization in 2014 (unremarkable findings), and GERD, who presents to the Emergency Department complaining of constant, right-sided chest pain radiating to her back and right arm, that began this morning. She states she last felt well yesterday. No treatments were noted. Palpation increases her pain. She denies SOB or any other symptoms besides her chest pain. 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   Past Medical History  Diagnosis Date  . Environmental allergies   . Back pain   . Chest pain 01/2013    Admitted to [facility] in 01/2013  . Hypertension 01/13/2013  . Headache(784.0)   . Hypercholesteremia   . GERD (gastroesophageal reflux disease)    Past Surgical History  Procedure Laterality Date  . Dilation and curettage of uterus    . Cardiac catheterization    . Unilateral salpingectomy Right     due to ectopic pregnancy  . Cholecystectomy N/A 01/21/2014    Procedure: LAPAROSCOPIC CHOLECYSTECTOMY;  Surgeon: Jamesetta So, MD;  Location: [facility];  Service: General;  Laterality: N/A;  . Left heart catheterization with coronary angiogram N/A 02/08/2013    Procedure: LEFT HEART CATHETERIZATION WITH CORONARY ANGIOGRAM;  Surgeon: Burnell Blanks, MD;  Location: [facility] CATH LAB;  Service: Cardiovascular;  Laterality: N/A;   Family History  Problem Relation Age of Onset  . Hypertension Father    Social History  Substance Use Topics  . Smoking status: Never Smoker   . Smokeless tobacco: Never Used  . Alcohol Use: 8.4 oz/week    14 Cans of beer per week     Comment: 2 cans of beer daily    OB History    Gravida Para Term Preterm AB TAB SAB Ectopic Multiple Living            0     Review of Systems  Constitutional: Negative for appetite change and fatigue.  HENT: Negative for congestion, ear discharge and sinus pressure.   Eyes: Negative for discharge.  Respiratory: Negative for cough and shortness of breath.   Cardiovascular: Positive for chest pain.  Gastrointestinal: Negative for abdominal pain and diarrhea.  Genitourinary: Negative for frequency and hematuria.  Musculoskeletal: Negative for back pain.  Skin: Negative for rash.  Neurological: Negative for seizures and headaches.  Psychiatric/Behavioral: Negative for hallucinations.      Allergies  Review of patient's allergies indicates no known allergies.  Home Medications   Prior to Admission medications   Medication  Sig Start Date End Date Taking? Authorizing Provider  acetaminophen (TYLENOL) 500 MG tablet Take 1,000 mg by mouth every 6 (six) hours as needed for pain.    Historical Provider, MD  aspirin EC 81 MG tablet Take 81 mg by mouth daily.    Historical Provider, MD  diphenhydrAMINE (BENADRYL) 25 MG tablet Take 50 mg by mouth 2 (two) times daily as needed for allergies.    Historical Provider, MD  HYDROcodone-acetaminophen (NORCO/VICODIN) 5-325 MG tablet Take 1 tablet by mouth every 4 (four) hours as needed for moderate pain  (Must last 30 days.  Do not take and drive a car or use machinery.). 11/15/15   Sanjuana Kava, MD  Multiple Vitamins-Minerals (MULTIVITAMIN WITH MINERALS) tablet Take 1 tablet by mouth daily.    Historical Provider, MD  naproxen (NAPROSYN) 500 MG tablet Take 1 tablet (500 mg total) by mouth 2 (two) times daily with a meal. 10/18/15   Sanjuana Kava, MD   BP 165/91 mmHg  Pulse 96  Temp(Src) 98.2 F (36.8 C) (Oral)  Resp 18  Ht 5\' 6"  (1.676 m)  Wt 180 lb (81.647 kg)  BMI 29.07 kg/m2  SpO2 100% Physical Exam  Constitutional: She is oriented to person, place, and time. She appears well-developed.  HENT:  Head: Normocephalic.  Eyes: Conjunctivae and EOM are normal. No scleral icterus.  Neck: Neck supple. No thyromegaly present.  Cardiovascular: Normal rate and regular rhythm.  Exam reveals no gallop and no friction rub.   No murmur heard. Pulmonary/Chest: No stridor. She has no wheezes. She has no rales. She exhibits tenderness.  Tenderness to palpation to right upper chest.  Abdominal: She exhibits no distension. There is no tenderness. There is no rebound.  Musculoskeletal: Normal range of motion. She exhibits no edema.  Lymphadenopathy:    She has no cervical adenopathy.  Neurological: She is oriented to person, place, and time. She exhibits normal muscle tone. Coordination normal.  Skin: No rash noted. No erythema.  Psychiatric: She has a normal mood and affect. Her behavior is normal.  Nursing note and vitals reviewed.   ED Course  Procedures (including critical care time)  DIAGNOSTIC STUDIES: Oxygen Saturation is 100% on RA, normal by my interpretation.    COORDINATION OF CARE: 12:22 PM - Discussed treatment plan with pt at bedside which includes cardiac work-up. Pt verbalized understanding and agreed to plan.   Labs Review Labs Reviewed  BASIC METABOLIC PANEL  CBC  TROPONIN I    Imaging Review No results found. I have personally reviewed and evaluated these images and  lab results as part of my medical decision-making.   EKG Interpretation   Date/Time:  Thursday December 07 2015 12:07:56 EDT Ventricular Rate:  98 PR Interval:  178 QRS Duration: 104 QT Interval:  370 QTC Calculation: 472 R Axis:   22 Text Interpretation:  Normal sinus rhythm Normal ECG Confirmed by Ezreal Turay   MD, Davene Jobin ([id number]) on 12/07/2015 12:23:12 PM      MDM   Final diagnoses:  None     Patient with right-sided chest pain. Labs which included 2 troponins were negative EKG unremarkable chest x-ray unremarkable suspect chest wall discomfort patient is on Vicodin for her knees. she was instructed to use the Vicodin as needed and follow-up with her PCP   Milton Ferguson, MD 12/07/15 1630

## 2015-12-07 NOTE — Discharge Instructions (Signed)
Fascia Vicodin as needed for pain follow-up with your family doctor next week if not improving

## 2015-12-07 NOTE — ED Notes (Signed)
Chest pain since this morning....

## 2015-12-18 ENCOUNTER — Telehealth: Payer: Self-pay | Admitting: Orthopedic Surgery

## 2015-12-18 MED ORDER — HYDROCODONE-ACETAMINOPHEN 5-325 MG PO TABS: 1.0000 | ORAL_TABLET | ORAL | Status: DC | PRN

## 2015-12-18 NOTE — Telephone Encounter (Signed)
Patient called to request refill pain medication:HYDROcodone-acetaminophen (NORCO/VICODIN) 5-325 MG tablet [id number]  Quantity 120; patient ph# [phone]

## 2015-12-18 NOTE — Telephone Encounter (Signed)
Rx done. 

## 2016-01-18 ENCOUNTER — Telehealth: Payer: Self-pay | Admitting: Orthopaedic Surgery

## 2016-01-18 MED ORDER — HYDROCODONE-ACETAMINOPHEN 5-325 MG PO TABS: 1.0000 | ORAL_TABLET | ORAL | Status: DC | PRN

## 2016-01-18 NOTE — Telephone Encounter (Signed)
Rx done. 

## 2016-01-18 NOTE — Telephone Encounter (Signed)
Patient called and requested a refill on Norco 5-325 mgs.    Sig: Take 1 tablet by mouth every 4 (four) hours as needed for moderate pain (Must last 30 days. Do not take and drive a car or use machinery.).

## 2016-01-25 ENCOUNTER — Emergency Department (HOSPITAL_COMMUNITY)
Admission: EM | Admit: 2016-01-25 | Discharge: 2016-01-25 | Disposition: A | Discharge status: Home / Self Care | Payer: BLUE CROSS/BLUE SHIELD | Attending: Emergency Medicine | Admitting: Emergency Medicine

## 2016-01-25 ENCOUNTER — Encounter (HOSPITAL_COMMUNITY): Payer: Self-pay | Admitting: Emergency Medicine

## 2016-01-25 ENCOUNTER — Emergency Department (HOSPITAL_COMMUNITY): Payer: BLUE CROSS/BLUE SHIELD

## 2016-01-25 DIAGNOSIS — Y999 Unspecified external cause status: Secondary | ICD-10-CM | POA: Diagnosis not present

## 2016-01-25 DIAGNOSIS — Y939 Activity, unspecified: Secondary | ICD-10-CM | POA: Diagnosis not present

## 2016-01-25 DIAGNOSIS — S93601A Unspecified sprain of right foot, initial encounter: Secondary | ICD-10-CM | POA: Insufficient documentation

## 2016-01-25 DIAGNOSIS — I10 Essential (primary) hypertension: Secondary | ICD-10-CM | POA: Insufficient documentation

## 2016-01-25 DIAGNOSIS — Z7982 Long term (current) use of aspirin: Secondary | ICD-10-CM | POA: Insufficient documentation

## 2016-01-25 DIAGNOSIS — W07XXXA Fall from chair, initial encounter: Secondary | ICD-10-CM | POA: Insufficient documentation

## 2016-01-25 DIAGNOSIS — Z79899 Other long term (current) drug therapy: Secondary | ICD-10-CM | POA: Diagnosis not present

## 2016-01-25 DIAGNOSIS — Y9289 Other specified places as the place of occurrence of the external cause: Secondary | ICD-10-CM | POA: Insufficient documentation

## 2016-01-25 DIAGNOSIS — S99921A Unspecified injury of right foot, initial encounter: Secondary | ICD-10-CM | POA: Diagnosis present

## 2016-01-25 MED ORDER — NAPROXEN 500 MG PO TABS: 500.0000 mg | ORAL_TABLET | Freq: Two times a day (BID) | ORAL | Status: DC

## 2016-01-25 NOTE — ED Notes (Signed)
Ice pack applied to right foot.

## 2016-01-25 NOTE — ED Notes (Signed)
PT states she was standing in a chair at a cabinet and went to step down off the chair and right leg went under her. PT c/o pain and bruisinh to top of right foot and pain with weight bearing.

## 2016-01-25 NOTE — Discharge Instructions (Signed)
Elastic Bandage and RICE °WHAT DOES AN ELASTIC BANDAGE DO? °Elastic bandages come in different shapes and sizes. They generally provide support to your injury and reduce swelling while you are healing, but they can perform different functions. Your health care provider will help you to decide what is best for your protection, recovery, or rehabilitation following an injury. °WHAT ARE SOME GENERAL TIPS FOR USING AN ELASTIC BANDAGE? °· Use the bandage as directed by the maker of the bandage that you are using. °· Do not wrap the bandage too tightly. This may cut off the circulation in the arm or leg in the area below the bandage. °· If part of your body beyond the bandage becomes blue, numb, cold, swollen, or is more painful, your bandage is most likely too tight. If this occurs, remove your bandage and reapply it more loosely. °· See your health care provider if the bandage seems to be making your problems worse rather than better. °· An elastic bandage should be removed and reapplied every 3-4 hours or as directed by your health care provider. °WHAT IS RICE? °The routine care of many injuries includes rest, ice, compression, and elevation (RICE therapy).  °Rest °Rest is required to allow your body to heal. Generally, you can resume your routine activities when you are comfortable and have been given permission by your health care provider. °Ice °Icing your injury helps to keep the swelling down and it reduces pain. Do not apply ice directly to your skin. °· Put ice in a plastic bag. °· Place a towel between your skin and the bag. °· Leave the ice on for 20 minutes, 2-3 times per day. °Do this for as long as you are directed by your health care provider. °Compression °Compression helps to keep swelling down, gives support, and helps with discomfort. Compression may be done with an elastic bandage. °Elevation °Elevation helps to reduce swelling and it decreases pain. If possible, your injured area should be placed at  or above the level of your heart or the center of your chest. °WHEN SHOULD I SEEK MEDICAL CARE? °You should seek medical care if: °· You have persistent pain and swelling. °· Your symptoms are getting worse rather than improving. °These symptoms may indicate that further evaluation or further X-rays are needed. Sometimes, X-rays may not show a small broken bone (fracture) until a number of days later. Make a follow-up appointment with your health care provider. Ask when your X-ray results will be ready. Make sure that you get your X-ray results. °WHEN SHOULD I SEEK IMMEDIATE MEDICAL CARE? °You should seek immediate medical care if: °· You have a sudden onset of severe pain at or below the area of your injury. °· You develop redness or increased swelling around your injury. °· You have tingling or numbness at or below the area of your injury that does not improve after you remove the elastic bandage. °  °This information is not intended to replace advice given to you by your health care provider. Make sure you discuss any questions you have with your health care provider. °  °Document Released: 02/15/2002 Document Revised: 05/17/2015 Document Reviewed: 04/11/2014 °Elsevier Interactive Patient Education ©2016 [employer] ° °Foot Sprain °A foot sprain is an injury to one of the strong bands of tissue (ligaments) that connect and support the many bones in your feet. The ligament can be stretched too much or it can tear. A tear can be either partial or complete. The severity of the sprain   depends on how much of the ligament was damaged or torn. °CAUSES °A foot sprain is usually caused by suddenly twisting or pivoting your foot. °RISK FACTORS °This injury is more likely to occur in people who: °· Play a sport, such as basketball or football. °· Exercise or play a sport without warming up. °· Start a new workout or sport. °· Suddenly increase how long or hard they exercise or play a sport. °SYMPTOMS °Symptoms of this  condition start soon after an injury and include: °· Pain, especially in the arch of the foot. °· Bruising. °· Swelling. °· Inability to walk or use the foot to support body weight. °DIAGNOSIS °This condition is diagnosed with a medical history and physical exam. You may also have imaging tests, such as: °· X-rays to make sure there are no broken bones (fractures). °· MRI to see if the ligament has torn. °TREATMENT °Treatment varies depending on the severity of your sprain. Mild sprains can be treated with rest, ice, compression, and elevation (RICE). If your ligament is overstretched or partially torn, treatment usually involves keeping your foot in a fixed position (immobilization) for a period of time. To help you do this, your health care provider will apply a bandage, splint, or walking boot to keep your foot from moving until it heals. You may also be advised to use crutches or a scooter for a few weeks to avoid bearing weight on your foot while it is healing. °If your ligament is fully torn, you may need surgery to reconnect the ligament to the bone. After surgery, a cast or splint will be applied and will need to stay on your foot while it heals. °Your health care provider may also suggest exercises or physical therapy to strengthen your foot. °HOME CARE INSTRUCTIONS °If You Have a Bandage, Splint, or Walking Boot: °· Wear it as directed by your health care provider. Remove it only as directed by your health care provider. °· Loosen the bandage, splint, or walking boot if your toes become numb and tingle, or if they turn cold and blue. °Bathing °· If your health care provider approves bathing and showering, cover the bandage or splint with a watertight plastic bag to protect it from water. Do not let the bandage or splint get wet. °Managing Pain, Stiffness, and Swelling  °· If directed, apply ice to the injured area: °¨ Put ice in a plastic bag. °¨ Place a towel between your skin and the bag. °¨ Leave the  ice on for 20 minutes, 2-3 times per day. °· Move your toes often to avoid stiffness and to lessen swelling. °· Raise (elevate) the injured area above the level of your heart while you are sitting or lying down. °Driving °· Do not drive or operate heavy machinery while taking pain medicine. °· Do not drive while wearing a bandage, splint, or walking boot on a foot that you use for driving. °Activity °· Rest as directed by your health care provider. °· Do not use the injured foot to support your body weight until your health care provider says that you can. Use crutches or other supportive devices as directed by your health care provider. °· Ask your health care provider what activities are safe for you. Gradually increase how much and how far you walk until your health care provider says it is safe to return to full activity. °· Do any exercise or physical therapy as directed by your health care provider. °General Instructions °· If a splint   was applied, do not put pressure on any part of it until it is fully hardened. This may take several hours. °· Take medicines only as directed by your health care provider. These include over-the-counter medicines and prescription medicines. °· Keep all follow-up visits as directed by your health care provider. This is important. °· When you can walk without pain, wear supportive shoes that have stiff soles. Do not wear flip-flops, and do not walk barefoot. °SEEK MEDICAL CARE IF: °· Your pain is not controlled with medicine. °· Your bruising or swelling gets worse or does not get better with treatment. °· Your splint or walking boot is damaged. °SEEK IMMEDIATE MEDICAL CARE IF: °· Your foot is numb or blue. °· Your foot feels colder than normal. °  °This information is not intended to replace advice given to you by your health care provider. Make sure you discuss any questions you have with your health care provider. °  °Document Released: 02/15/2002 Document Revised: 01/10/2015  Document Reviewed: 06/29/2014 °Elsevier Interactive Patient Education ©2016 [employer] ° °

## 2016-01-28 NOTE — ED Provider Notes (Signed)
CSN: [id number]     Arrival date & time 01/25/16  1151 History   First MD Initiated Contact with Patient 01/25/16 1225     Chief Complaint  Patient presents with  . Fall     (Consider location/radiation/quality/duration/timing/severity/associated sxs/prior Treatment) HPI   Kendra Valenzuela is a 55 y.o. female who presents to the Emergency Department complaining of pain and bruising to the right foot.  She states the pain began after a fall from a chair that resulted in a twisting injury to her foot.  She reports swelling to the top of her foot and increasing pain with weight bearing.  She has taken tylenol without relief.  She denies numbness or weakness, open wounds, or other injuries.     Past Medical History  Diagnosis Date  . Environmental allergies   . Back pain   . Chest pain 01/2013    Admitted to [facility] in 01/2013  . Hypertension 01/13/2013  . Headache(784.0)   . Hypercholesteremia   . GERD (gastroesophageal reflux disease)    Past Surgical History  Procedure Laterality Date  . Dilation and curettage of uterus    . Cardiac catheterization    . Unilateral salpingectomy Right     due to ectopic pregnancy  . Cholecystectomy N/A 01/21/2014    Procedure: LAPAROSCOPIC CHOLECYSTECTOMY;  Surgeon: Jamesetta So, MD;  Location: [facility];  Service: General;  Laterality: N/A;  . Left heart catheterization with coronary angiogram N/A 02/08/2013    Procedure: LEFT HEART CATHETERIZATION WITH CORONARY ANGIOGRAM;  Surgeon: Burnell Blanks, MD;  Location: [facility] CATH LAB;  Service: Cardiovascular;  Laterality: N/A;   Family History  Problem Relation Age of Onset  . Hypertension Father    Social History  Substance Use Topics  . Smoking status: Never Smoker   . Smokeless tobacco: Never Used  . Alcohol Use: 8.4 oz/week    14 Cans of beer per week     Comment: 2 cans of beer daily    OB History    Gravida Para Term Preterm AB TAB SAB Ectopic Multiple Living            0     Review  of Systems  Constitutional: Negative for fever and chills.  Musculoskeletal: Positive for joint swelling and arthralgias (right foot pain). Negative for neck pain.  Skin: Negative for color change and wound.  Neurological: Negative for dizziness, syncope, weakness, numbness and headaches.  All other systems reviewed and are negative.     Allergies  Review of patient's allergies indicates no known allergies.  Home Medications   Prior to Admission medications   Medication Sig Start Date End Date Taking? Authorizing Provider  acetaminophen (TYLENOL) 500 MG tablet Take 1,000 mg by mouth every 6 (six) hours as needed for pain.   Yes Historical Provider, MD  aspirin EC 81 MG tablet Take 81 mg by mouth daily.   Yes Historical Provider, MD  diphenhydrAMINE (BENADRYL) 25 MG tablet Take 50 mg by mouth 2 (two) times daily as needed for allergies.   Yes Historical Provider, MD  HYDROcodone-acetaminophen (NORCO/VICODIN) 5-325 MG tablet Take 1 tablet by mouth every 4 (four) hours as needed for moderate pain (Must last 30 days.  Do not take and drive a car or use machinery.). 01/18/16  Yes Sanjuana Kava, MD  lisinopril-hydrochlorothiazide (PRINZIDE,ZESTORETIC) 20-12.5 MG tablet Take 1 tablet by mouth daily.  01/16/16  Yes Historical Provider, MD  Multiple Vitamins-Minerals (MULTIVITAMIN WITH MINERALS) tablet Take 1 tablet by mouth  daily.   Yes Historical Provider, MD  pravastatin (PRAVACHOL) 40 MG tablet Take 40 mg by mouth daily.  01/16/16  Yes Historical Provider, MD  naproxen (NAPROSYN) 500 MG tablet Take 1 tablet (500 mg total) by mouth 2 (two) times daily with a meal. 01/25/16   Maudy Yonan, PA-C   BP 138/91 mmHg  Pulse 88  Resp 16  Ht 5\' 6"  (1.676 m)  Wt 77.111 kg  BMI 27.45 kg/m2  SpO2 100% Physical Exam  Constitutional: She is oriented to person, place, and time. She appears well-developed and well-nourished. No distress.  HENT:  Head: Normocephalic and atraumatic.  Neck: Normal range of  motion.  Cardiovascular: Normal rate, regular rhythm and intact distal pulses.   Pulmonary/Chest: Effort normal and breath sounds normal. No respiratory distress.  Musculoskeletal: She exhibits tenderness.  ttp of the dorsal right foot.  Mild edema and small ecchymosis.   DP pulse is brisk,distal sensation intact.  No erythema, abrasion, or bony deformity.  No proximal tenderness. Compartments soft.  Neurological: She is alert and oriented to person, place, and time. She exhibits normal muscle tone. Coordination normal.  Skin: Skin is warm and dry.  Nursing note and vitals reviewed.   ED Course  Procedures (including critical care time) Labs Review Labs Reviewed - No data to display  Imaging Review Dg Foot Complete Right  01/25/2016  CLINICAL DATA:  55 year old female who fell from chair. Twisting injury with pain and bruising. Initial encounter. EXAM: RIGHT FOOT COMPLETE - 3+ VIEW COMPARISON:  Right foot series 7 01/2006 FINDINGS: Bone mineralization is stable and within normal limits. Calcaneus is stable and intact. Tarsal bone alignment appears stable. Mild chronic degenerative changes at the first MTP joint. Metatarsals are intact. Chronic fracture of the third proximal phalanx. No acute fracture or dislocation identified. IMPRESSION: No acute fracture or dislocation identified about the right foot. Electronically Signed   By: Genevie Ann M.D.   On: 01/25/2016 12:26    I have personally reviewed and evaluated these images and lab results as part of my medical decision-making.   EKG Interpretation None      MDM   Final diagnoses:  Sprain of foot, right, initial encounter    XR neg for fx.  ACE wrap applied, crutches given.  Likely sprain.  Pt agrees to RICE therapy and ortho f/u if needed,    Kem Parkinson, PA-C 01/28/16 1931  Merrily Pew, MD 01/30/16 [phone]

## 2016-02-01 ENCOUNTER — Ambulatory Visit (INDEPENDENT_AMBULATORY_CARE_PROVIDER_SITE_OTHER): Payer: BLUE CROSS/BLUE SHIELD | Admitting: Orthopaedic Surgery

## 2016-02-01 ENCOUNTER — Encounter: Payer: Self-pay | Admitting: Orthopaedic Surgery

## 2016-02-01 ENCOUNTER — Ambulatory Visit (INDEPENDENT_AMBULATORY_CARE_PROVIDER_SITE_OTHER): Payer: BLUE CROSS/BLUE SHIELD

## 2016-02-01 VITALS — BP 125/86 | HR 96 | Temp 97.5°F | Ht 66.0 in | Wt 170.0 lb

## 2016-02-01 DIAGNOSIS — M25562 Pain in left knee: Secondary | ICD-10-CM | POA: Diagnosis not present

## 2016-02-01 DIAGNOSIS — M25561 Pain in right knee: Secondary | ICD-10-CM

## 2016-02-01 DIAGNOSIS — I10 Essential (primary) hypertension: Secondary | ICD-10-CM | POA: Diagnosis not present

## 2016-02-01 NOTE — Progress Notes (Signed)
Patient QG:[id number] Kendra Valenzuela, female DOB:Apr 09, 1961, 55 y.o. [id number]  Chief Complaint  Patient presents with  . Follow-up    Right knee pain, fell on it 01/25/16    HPI  Kendra Valenzuela is a 55 y.o. female who has bilateral knee pain.  She was doing well until she fell and hurt her knee on the right. She also hurt her right ankle. She went to the ER on the 18th and had x-rays of the right foot which was negative.  The right knee is hurting more.  She has lateral pain of the knee.  She is limping.  She has no redness.  She has an effusion.  I will get x-rays today. HPI  Her hypertension is under control.  Her lower back pain was not affected by the fall.  She has GERD.  The naprosyn from the ER has irritated her stomach and I will not renew it at this time.   Body mass index is 27.45 kg/(m^2).  ROS  Review of Systems  Constitutional:       Patient does not have Diabetes Mellitus. Patient has hypertension. Patient does not have COPD or shortness of breath. Patient does not have BMI > 35. Patient does not have current smoking history.  HENT: Positive for congestion.   Respiratory: Positive for shortness of breath.   Cardiovascular: Negative for chest pain.  Endocrine: Positive for cold intolerance.  Musculoskeletal: Positive for joint swelling, arthralgias and gait problem.    Past Medical History  Diagnosis Date  . Environmental allergies   . Back pain   . Chest pain 01/2013    Admitted to [facility] in 01/2013  . Hypertension 01/13/2013  . Headache(784.0)   . Hypercholesteremia   . GERD (gastroesophageal reflux disease)     Past Surgical History  Procedure Laterality Date  . Dilation and curettage of uterus    . Cardiac catheterization    . Unilateral salpingectomy Right     due to ectopic pregnancy  . Cholecystectomy N/A 01/21/2014    Procedure: LAPAROSCOPIC CHOLECYSTECTOMY;  Surgeon: Jamesetta So, MD;  Location: [facility];  Service: General;  Laterality: N/A;   . Left heart catheterization with coronary angiogram N/A 02/08/2013    Procedure: LEFT HEART CATHETERIZATION WITH CORONARY ANGIOGRAM;  Surgeon: Burnell Blanks, MD;  Location: [facility] CATH LAB;  Service: Cardiovascular;  Laterality: N/A;    Family History  Problem Relation Age of Onset  . Hypertension Father     Social History Social History  Substance Use Topics  . Smoking status: Never Smoker   . Smokeless tobacco: Never Used  . Alcohol Use: 8.4 oz/week    14 Cans of beer per week     Comment: 2 cans of beer daily     No Known Allergies  Current Outpatient Prescriptions  Medication Sig Dispense Refill  . acetaminophen (TYLENOL) 500 MG tablet Take 1,000 mg by mouth every 6 (six) hours as needed for pain.    Marland Kitchen aspirin EC 81 MG tablet Take 81 mg by mouth daily.    . diphenhydrAMINE (BENADRYL) 25 MG tablet Take 50 mg by mouth 2 (two) times daily as needed for allergies.    Marland Kitchen HYDROcodone-acetaminophen (NORCO/VICODIN) 5-325 MG tablet Take 1 tablet by mouth every 4 (four) hours as needed for moderate pain (Must last 30 days.  Do not take and drive a car or use machinery.). 120 tablet 0  . lisinopril-hydrochlorothiazide (PRINZIDE,ZESTORETIC) 20-12.5 MG tablet Take 1 tablet by mouth daily.  3  . Multiple Vitamins-Minerals (MULTIVITAMIN WITH MINERALS) tablet Take 1 tablet by mouth daily.    . naproxen (NAPROSYN) 500 MG tablet Take 1 tablet (500 mg total) by mouth 2 (two) times daily with a meal. 20 tablet 0  . pravastatin (PRAVACHOL) 40 MG tablet Take 40 mg by mouth daily.   3   No current facility-administered medications for this visit.     Physical Exam  Blood pressure 125/86, pulse 96, temperature 97.5 F (36.4 C), height 5\' 6"  (1.676 m), weight 170 lb (77.111 kg).  Constitutional: overall normal hygiene, normal nutrition, well developed, normal grooming, normal body habitus. Assistive device:none  Musculoskeletal: gait and station Limp right, muscle tone and strength are  normal, no tremors or atrophy is present.  .  Neurological: coordination overall normal.  Deep tendon reflex/nerve stretch intact.  Sensation normal.  Cranial nerves II-XII intact.   Skin:   normal overall no scars, lesions, ulcers or rashes. No psoriasis.  Psychiatric: Alert and oriented x 3.  Recent memory intact, remote memory unclear.  Normal mood and affect. Well groomed.  Good eye contact.  Cardiovascular: overall no swelling, no varicosities, no edema bilaterally, normal temperatures of the legs and arms, no clubbing, cyanosis and good capillary refill.  Lymphatic: palpation is normal.  The right lower extremity is examined:  Inspection:  Thigh:  Non-tender and no defects  Knee has swelling 1+ effusion.                        Joint tenderness is present                        Patient is tender over the medial joint line  Lower Leg:  Has normal appearance and no tenderness or defects  Ankle:  Non-tender and no defects  Foot:  Non-tender and no defects Range of Motion:  Knee:  Range of motion is: 0-100                        Crepitus is  present  Ankle:  Range of motion is normal. Strength and Tone:  The right lower extremity has normal strength and tone. Stability:  Knee:  The knee is stable.  Ankle:  The ankle is stable.   The left knee has some crepitus, slight effusion, ROM 0 to 110.  X-rays were done of the right knee, reported separately.  The patient has been educated about the nature of the problem(s) and counseled on treatment options.  The patient appeared to understand what I have discussed and is in agreement with it.  Encounter Diagnoses  Name Primary?  . Right knee pain Yes  . Left knee pain   . Essential hypertension    PROCEDURE NOTE:  The patient requests injections of the right knee , verbal consent was obtained.  The right knee was prepped appropriately after time out was performed.   Sterile technique was observed and injection of 1 cc of  Depo-Medrol 40 mg with several cc's of plain xylocaine. Anesthesia was provided by ethyl chloride and a 20-gauge needle was used to inject the knee area. The injection was tolerated well.  A band aid dressing was applied.  The patient was advised to apply ice later today and tomorrow to the injection sight as needed.  PLAN Call if any problems.  Precautions discussed.  Continue current medications.   Return to clinic 1 month

## 2016-02-20 ENCOUNTER — Telehealth: Payer: Self-pay | Admitting: Orthopaedic Surgery

## 2016-02-20 MED ORDER — HYDROCODONE-ACETAMINOPHEN 5-325 MG PO TABS: 1.0000 | ORAL_TABLET | ORAL | Status: DC | PRN

## 2016-02-20 NOTE — Telephone Encounter (Signed)
Rx done. 

## 2016-02-20 NOTE — Telephone Encounter (Signed)
Hydrocodone-Acetaminophen  5/325mg  Qty 120 Tablets °

## 2016-02-29 ENCOUNTER — Ambulatory Visit (INDEPENDENT_AMBULATORY_CARE_PROVIDER_SITE_OTHER): Payer: BLUE CROSS/BLUE SHIELD | Admitting: Orthopaedic Surgery

## 2016-02-29 ENCOUNTER — Encounter: Payer: Self-pay | Admitting: Orthopaedic Surgery

## 2016-02-29 VITALS — BP 115/76 | HR 93 | Temp 97.3°F | Ht 65.0 in | Wt 179.0 lb

## 2016-02-29 DIAGNOSIS — M25561 Pain in right knee: Secondary | ICD-10-CM

## 2016-02-29 DIAGNOSIS — I10 Essential (primary) hypertension: Secondary | ICD-10-CM

## 2016-02-29 NOTE — Progress Notes (Signed)
Patient Kendra Valenzuela:[id number] Kendra Valenzuela, female DOB:26-Aug-1961, 55 y.o. [id number]  Chief Complaint  Patient presents with  . Follow-up    Right knee    HPI  Kendra Valenzuela is a 55 y.o. female who has right knee pain.  She is much improved.  The injection last time helped a lot.  She has no giving way, no swelling, no locking.  She is active and pleased at how she is doing.  HPI  Body mass index is 29.79 kg/(m^2).  ROS  Review of Systems  Constitutional:       Patient does not have Diabetes Mellitus. Patient has hypertension. Patient does not have COPD or shortness of breath. Patient does not have BMI > 35. Patient does not have current smoking history.  HENT: Positive for congestion.   Respiratory: Positive for shortness of breath.   Cardiovascular: Negative for chest pain.  Endocrine: Positive for cold intolerance.  Musculoskeletal: Positive for joint swelling, arthralgias and gait problem.    Past Medical History  Diagnosis Date  . Environmental allergies   . Back pain   . Chest pain 01/2013    Admitted to [facility] in 01/2013  . Hypertension 01/13/2013  . Headache(784.0)   . Hypercholesteremia   . GERD (gastroesophageal reflux disease)     Past Surgical History  Procedure Laterality Date  . Dilation and curettage of uterus    . Cardiac catheterization    . Unilateral salpingectomy Right     due to ectopic pregnancy  . Cholecystectomy N/A 01/21/2014    Procedure: LAPAROSCOPIC CHOLECYSTECTOMY;  Surgeon: Jamesetta So, MD;  Location: [facility];  Service: General;  Laterality: N/A;  . Left heart catheterization with coronary angiogram N/A 02/08/2013    Procedure: LEFT HEART CATHETERIZATION WITH CORONARY ANGIOGRAM;  Surgeon: Burnell Blanks, MD;  Location: [facility] C[facility] LAB;  Service: Cardiovascular;  Laterality: N/A;    Family History  Problem Relation Age of Onset  . Hypertension Father     Social History Social History  Substance Use Topics  . Smoking status: Never  Smoker   . Smokeless tobacco: Never Used  . Alcohol Use: 8.4 oz/week    14 Cans of beer per week     Comment: 2 cans of beer daily     No Known Allergies  Current Outpatient Prescriptions  Medication Sig Dispense Refill  . acetaminophen (TYLENOL) 500 MG tablet Take 1,000 mg by mouth every 6 (six) hours as needed for pain.    Marland Kitchen aspirin EC 81 MG tablet Take 81 mg by mouth daily.    . diphenhydrAMINE (BENADRYL) 25 MG tablet Take 50 mg by mouth 2 (two) times daily as needed for allergies.    Marland Kitchen HYDROcodone-acetaminophen (NORCO/VICODIN) 5-325 MG tablet Take 1 tablet by mouth every 4 (four) hours as needed for moderate pain (Must last 30 days.  Do not take and drive a car or use machinery.). 120 tablet 0  . lisinopril-hydrochlorothiazide (PRINZIDE,ZESTORETIC) 20-12.5 MG tablet Take 1 tablet by mouth daily.   3  . Multiple Vitamins-Minerals (MULTIVITAMIN WITH MINERALS) tablet Take 1 tablet by mouth daily.    . naproxen (NAPROSYN) 500 MG tablet Take 1 tablet (500 mg total) by mouth 2 (two) times daily with a meal. 20 tablet 0  . pravastatin (PRAVACHOL) 40 MG tablet Take 40 mg by mouth daily.   3   No current facility-administered medications for this visit.     Physical Exam  Blood pressure 115/76, pulse 93, temperature 97.3 F (36.3 C), height  5\' 5"  (1.651 m), weight 179 lb (81.194 kg).  Constitutional: overall normal hygiene, normal nutrition, well developed, normal grooming, normal body habitus. Assistive device:none  Musculoskeletal: gait and station Limp none, muscle tone and strength are normal, no tremors or atrophy is present.  .  Neurological: coordination overall normal.  Deep tendon reflex/nerve stretch intact.  Sensation normal.  Cranial nerves II-XII intact.   Skin:   normal overall no scars, lesions, ulcers or rashes. No psoriasis.  Psychiatric: Alert and oriented x 3.  Recent memory intact, remote memory unclear.  Normal mood and affect. Well groomed.  Good eye  contact.  Cardiovascular: overall no swelling, no varicosities, no edema bilaterally, normal temperatures of the legs and arms, no clubbing, cyanosis and good capillary refill.  Lymphatic: palpation is normal.  The right lower extremity is examined:  Inspection:  Thigh:  Non-tender and no defects  Knee does not have swelling 0 effusion.                        Joint tenderness is not present                        Patient is not tender over the medial joint line  Lower Leg:  Has normal appearance and no tenderness or defects  Ankle:  Non-tender and no defects  Foot:  Non-tender and no defects Range of Motion:  Knee:  Range of motion is: full                        Crepitus is  present  Ankle:  Range of motion is normal. Strength and Tone:  The right lower extremity has normal strength and tone. Stability:  Knee:  The knee is stable.  Ankle:  The ankle is stable.    The patient has been educated about the nature of the problem(s) and counseled on treatment options.  The patient appeared to understand what I have discussed and is in agreement with it.  Encounter Diagnoses  Name Primary?  . Right knee pain Yes  . Essential hypertension     PLAN Call if any problems.  Precautions discussed.  Continue current medications.   Return to clinic prn   Electronically Signed Sanjuana Kava, MD 6/22/201710:25 AM

## 2016-03-27 ENCOUNTER — Telehealth: Payer: Self-pay | Admitting: Orthopaedic Surgery

## 2016-03-27 NOTE — Telephone Encounter (Signed)
Patient called to ask for refill of medication: HYDROcodone-acetaminophen (NORCO/VICODIN) 5-325 MG tablet - quantity 120

## 2016-03-28 MED ORDER — HYDROCODONE-ACETAMINOPHEN 5-325 MG PO TABS: 1.0000 | ORAL_TABLET | ORAL | Status: DC | PRN

## 2016-03-28 NOTE — Telephone Encounter (Signed)
Rx done. 

## 2016-05-07 ENCOUNTER — Telehealth: Payer: Self-pay | Admitting: Orthopaedic Surgery

## 2016-05-07 MED ORDER — HYDROCODONE-ACETAMINOPHEN 5-325 MG PO TABS: 1.0000 | ORAL_TABLET | Freq: Four times a day (QID) | ORAL | 0 refills | Status: DC | PRN

## 2016-05-07 NOTE — Telephone Encounter (Signed)
Patient called and requested a refill on Hydrocodone-Acetaminophen(Norco) 5-325 mgs.  Qty 120  Sig: Take 1 tablet by mouth every 4 (four) hours as needed for moderate pain (Must last 30 days.  Do not take and drive a car or use machinery.). °

## 2016-05-28 ENCOUNTER — Ambulatory Visit: Payer: BLUE CROSS/BLUE SHIELD | Admitting: Orthopaedic Surgery

## 2016-06-05 ENCOUNTER — Ambulatory Visit: Payer: BLUE CROSS/BLUE SHIELD | Admitting: Orthopaedic Surgery

## 2016-06-20 ENCOUNTER — Encounter: Payer: Self-pay | Admitting: Orthopaedic Surgery

## 2016-06-20 ENCOUNTER — Ambulatory Visit (INDEPENDENT_AMBULATORY_CARE_PROVIDER_SITE_OTHER): Payer: BLUE CROSS/BLUE SHIELD | Admitting: Orthopaedic Surgery

## 2016-06-20 VITALS — BP 139/92 | HR 83 | Temp 97.5°F | Ht 65.5 in | Wt 177.0 lb

## 2016-06-20 DIAGNOSIS — M25561 Pain in right knee: Secondary | ICD-10-CM | POA: Diagnosis not present

## 2016-06-20 DIAGNOSIS — I10 Essential (primary) hypertension: Secondary | ICD-10-CM

## 2016-06-20 DIAGNOSIS — G8929 Other chronic pain: Secondary | ICD-10-CM | POA: Diagnosis not present

## 2016-06-20 MED ORDER — HYDROCODONE-ACETAMINOPHEN 5-325 MG PO TABS: 1.0000 | ORAL_TABLET | Freq: Four times a day (QID) | ORAL | 0 refills | Status: DC | PRN

## 2016-06-20 NOTE — Progress Notes (Signed)
Patient QG:[id number] Kendra Valenzuela, female DOB:Apr 21, 1961, 55 y.o. [id number]  Chief Complaint  Patient presents with  . Follow-up    right knee pain    HPI  Kendra Valenzuela is a 55 y.o. female who has chronic pain of the right knee. She has had more pain in the knee, more swelling and now giving way. She has had injection in the past, medicine. She has used ice, heat, rubs with little help.  I will get MRI of the knee. HPI  Body mass index is 29.01 kg/m.  ROS  Review of Systems  Constitutional:       Patient does not have Diabetes Mellitus. Patient has hypertension. Patient does not have COPD or shortness of breath. Patient does not have BMI > 35. Patient does not have current smoking history.  HENT: Positive for congestion.   Respiratory: Positive for shortness of breath.   Cardiovascular: Negative for chest pain.  Endocrine: Positive for cold intolerance.  Musculoskeletal: Positive for arthralgias, gait problem and joint swelling.    Past Medical History:  Diagnosis Date  . Back pain   . Chest pain 01/2013   Admitted to [facility] in 01/2013  . Environmental allergies   . GERD (gastroesophageal reflux disease)   . Headache(784.0)   . Hypercholesteremia   . Hypertension 01/13/2013    Past Surgical History:  Procedure Laterality Date  . CARDIAC CATHETERIZATION    . CHOLECYSTECTOMY N/A 01/21/2014   Procedure: LAPAROSCOPIC CHOLECYSTECTOMY;  Surgeon: Jamesetta So, MD;  Location: [facility];  Service: General;  Laterality: N/A;  . DILATION AND CURETTAGE OF UTERUS    . LEFT HEART CATHETERIZATION WITH CORONARY ANGIOGRAM N/A 02/08/2013   Procedure: LEFT HEART CATHETERIZATION WITH CORONARY ANGIOGRAM;  Surgeon: Burnell Blanks, MD;  Location: [facility] CATH LAB;  Service: Cardiovascular;  Laterality: N/A;  . UNILATERAL SALPINGECTOMY Right    due to ectopic pregnancy    Family History  Problem Relation Age of Onset  . Hypertension Father     Social History Social History   Substance Use Topics  . Smoking status: Never Smoker  . Smokeless tobacco: Never Used  . Alcohol use 8.4 oz/week    14 Cans of beer per week     Comment: 2 cans of beer daily     No Known Allergies  Current Outpatient Prescriptions  Medication Sig Dispense Refill  . lisinopril-hydrochlorothiazide (PRINZIDE,ZESTORETIC) 20-12.5 MG tablet Take 1 tablet by mouth daily.   3  . LORazepam (ATIVAN) 1 MG tablet 1 mg every 8 (eight) hours.    . naproxen (NAPROSYN) 500 MG tablet Take 1 tablet (500 mg total) by mouth 2 (two) times daily with a meal. 20 tablet 0  . pravastatin (PRAVACHOL) 40 MG tablet Take 40 mg by mouth daily.   3  . acetaminophen (TYLENOL) 500 MG tablet Take 1,000 mg by mouth every 6 (six) hours as needed for pain.    Marland Kitchen aspirin EC 81 MG tablet Take 81 mg by mouth daily.    . diphenhydrAMINE (BENADRYL) 25 MG tablet Take 50 mg by mouth 2 (two) times daily as needed for allergies.    Marland Kitchen HYDROcodone-acetaminophen (NORCO/VICODIN) 5-325 MG tablet Take 1 tablet by mouth every 6 (six) hours as needed for moderate pain (Must last 30 days.Do not take and drive a car or use machinery.). 100 tablet 0  . Multiple Vitamins-Minerals (MULTIVITAMIN WITH MINERALS) tablet Take 1 tablet by mouth daily.     No current facility-administered medications for this visit.  Physical Exam  Blood pressure (!) 139/92, pulse 83, temperature 97.5 F (36.4 C), height 5' 5.5" (1.664 m), weight 177 lb (80.3 kg).  Constitutional: overall normal hygiene, normal nutrition, well developed, normal grooming, normal body habitus. Assistive device:none  Musculoskeletal: gait and station Limp right, muscle tone and strength are normal, no tremors or atrophy is present.  .  Neurological: coordination overall normal.  Deep tendon reflex/nerve stretch intact.  Sensation normal.  Cranial nerves II-XII intact.   Skin:   Normal overall no scars, lesions, ulcers or rashes. No psoriasis.  Psychiatric: Alert and  oriented x 3.  Recent memory intact, remote memory unclear.  Normal mood and affect. Well groomed.  Good eye contact.  Cardiovascular: overall no swelling, no varicosities, no edema bilaterally, normal temperatures of the legs and arms, no clubbing, cyanosis and good capillary refill.  Lymphatic: palpation is normal.  The right lower extremity is examined:  Inspection:  Thigh:  Non-tender and no defects  Knee has swelling 1+ effusion.                        Joint tenderness is present                        Patient is tender over the medial joint line  Lower Leg:  Has normal appearance and no tenderness or defects  Ankle:  Non-tender and no defects  Foot:  Non-tender and no defects Range of Motion:  Knee:  Range of motion is: 0-105                        Crepitus is  present  Ankle:  Range of motion is normal. Strength and Tone:  The right lower extremity has normal strength and tone. Stability:  Knee:  The knee has positive medial McMurray  Ankle:  The ankle is stable.    The patient has been educated about the nature of the problem(s) and counseled on treatment options.  The patient appeared to understand what I have discussed and is in agreement with it.  Encounter Diagnoses  Name Primary?  . Chronic pain of right knee Yes  . Essential hypertension     PLAN Call if any problems.  Precautions discussed.  Continue current medications.   Return to clinic after MRI of the right knee.  She has giving way, swelling, positive medial McMurray and no improvement over the last several months with conservative therapy.   Electronically Signed Sanjuana Kava, MD 10/12/201710:08 AM

## 2016-06-25 ENCOUNTER — Telehealth: Payer: Self-pay | Admitting: Orthopaedic Surgery

## 2016-06-25 NOTE — Telephone Encounter (Signed)
I spoke with the patient and told her the [place] had denied the MRI.  I will continue to work on it and let her know as soon as possible the next step for her.  Please advise if you wish to do a peer to peer on this request.

## 2016-06-25 NOTE — Telephone Encounter (Signed)
Patient called asking if her MRI has been setup, because she hasn't heard anything. I did explain to her that we would have to contact her insurance and then she would be contacted.  Please advise

## 2016-06-26 ENCOUNTER — Other Ambulatory Visit: Payer: Self-pay | Admitting: Radiology

## 2016-06-26 DIAGNOSIS — M25561 Pain in right knee: Principal | ICD-10-CM

## 2016-06-26 DIAGNOSIS — G8929 Other chronic pain: Secondary | ICD-10-CM

## 2016-06-26 NOTE — Telephone Encounter (Signed)
Peer to peer of course

## 2016-07-10 ENCOUNTER — Ambulatory Visit (HOSPITAL_COMMUNITY): Payer: BLUE CROSS/BLUE SHIELD | Attending: Orthopaedic Surgery | Admitting: Physical Therapy

## 2016-07-10 DIAGNOSIS — R262 Difficulty in walking, not elsewhere classified: Secondary | ICD-10-CM | POA: Diagnosis present

## 2016-07-10 DIAGNOSIS — R29898 Other symptoms and signs involving the musculoskeletal system: Secondary | ICD-10-CM | POA: Diagnosis present

## 2016-07-10 DIAGNOSIS — G8929 Other chronic pain: Secondary | ICD-10-CM | POA: Diagnosis present

## 2016-07-10 DIAGNOSIS — R600 Localized edema: Secondary | ICD-10-CM

## 2016-07-10 DIAGNOSIS — M25561 Pain in right knee: Secondary | ICD-10-CM | POA: Insufficient documentation

## 2016-07-10 DIAGNOSIS — M6281 Muscle weakness (generalized): Secondary | ICD-10-CM | POA: Insufficient documentation

## 2016-07-10 NOTE — Therapy (Signed)
[facility] [address] [place], [place], [address] Phone: [phone]   Fax:  [phone]  Physical Therapy Evaluation  Patient Details  Name: Kendra Valenzuela MRN: [id number] Date of Birth: 01-Mar-1961 Referring Provider: Sanjuana Kava   Encounter Date: 07/10/2016      PT End of Session - 07/10/16 0955    Visit Number 1   Number of Visits 16   Date for PT Re-Evaluation 08/07/16   Authorization Type [employer] PPO   Authorization Time Period 07/10/16 to 09/09/16   PT Start Time 0902   PT Stop Time 0941   PT Time Calculation (min) 39 min   Activity Tolerance Patient tolerated treatment well;Patient limited by pain   Behavior During Therapy [facility] for tasks assessed/performed      Past Medical History:  Diagnosis Date  . Back pain   . Chest pain 01/2013   Admitted to [facility] in 01/2013  . Environmental allergies   . GERD (gastroesophageal reflux disease)   . Headache(784.0)   . Hypercholesteremia   . Hypertension 01/13/2013    Past Surgical History:  Procedure Laterality Date  . CARDIAC CATHETERIZATION    . CHOLECYSTECTOMY N/A 01/21/2014   Procedure: LAPAROSCOPIC CHOLECYSTECTOMY;  Surgeon: Jamesetta So, MD;  Location: [facility];  Service: General;  Laterality: N/A;  . DILATION AND CURETTAGE OF UTERUS    . LEFT HEART CATHETERIZATION WITH CORONARY ANGIOGRAM N/A 02/08/2013   Procedure: LEFT HEART CATHETERIZATION WITH CORONARY ANGIOGRAM;  Surgeon: Burnell Blanks, MD;  Location: [facility] CATH LAB;  Service: Cardiovascular;  Laterality: N/A;  . UNILATERAL SALPINGECTOMY Right    due to ectopic pregnancy    There were no vitals filed for this visit.       Subjective Assessment - 07/10/16 0906    Subjective Patient arrives stating she has had chronic R knee pain; she has gotten shots in her knee but it has continued to hurt quite badly, the shots did not help. The pain is pretty constant to the point where it is really interfering with her  daily activities. She has fallen in the past but is not sure if this is related. Imaging is negative for fracture. She states that sometimes pain runs up her leg from her foot, right up the bone.      Pertinent History HTN, history of back pain, anemia    How long can you sit comfortably? immediate discomfort    How long can you stand comfortably? 30 minutes    How long can you walk comfortably? 30-40 minutes    Diagnostic tests imaging reveals moderate joint degeneration    Patient Stated Goals reduce pain   Currently in Pain? Yes   Pain Score 7    Pain Location Knee   Pain Orientation Right   Pain Descriptors / Indicators Shooting;Discomfort;Aching   Pain Type Chronic pain   Pain Radiating Towards can sometimes radiate up leg, sometimes up to knee from foot    Pain Onset More than a month ago   Pain Frequency Constant   Aggravating Factors  activity in general, being up on it    Pain Relieving Factors laying down, elevating leg, pain medicine    Effect of Pain on Daily Activities severe impact on daily activities             [facility] PT Assessment - 07/10/16 0001      Assessment   Medical Diagnosis chronic R knee pain    Referring Provider Sanjuana Kava  Onset Date/Surgical Date --  chronic    Next MD Visit Dr. Luna Glasgow at the end of November    Prior Therapy none      Precautions   Precaution Comments none      Restrictions   Weight Bearing Restrictions No     Balance Screen   Has the patient fallen in the past 6 months Yes   How many times? 1- fell out of a chair while getting up into a cabinet, landing with her leg back under her    Has the patient had a decrease in activity level because of a fear of falling?  No   Is the patient reluctant to leave their home because of a fear of falling?  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, cooking/cleaning/bathing of residents      Observation/Other Assessments   Observations all ligament tests positive R knee; localized edema present; atrophy noted muscles of  R LE    Focus on Therapeutic Outcomes (FOTO)  60% limited      AROM   Overall AROM Comments severe hip mobility limtation B, however unable to specifically test due to poor tolerance of test secondary to knee pain    Right Knee Extension 5   Right Knee Flexion 69   Right Ankle Dorsiflexion 10     Strength   Right Hip Flexion 3/5   Right Hip Extension 3-/5   Right Hip ABduction 3-/5   Left Hip Flexion 3+/5   Left Hip Extension 2+/5   Left Hip ABduction 3/5   Right Knee Flexion 4-/5   Right Knee Extension 3+/5   Left Knee Flexion 4/5   Left Knee Extension 4+/5   Right Ankle Dorsiflexion 4+/5   Left Ankle Dorsiflexion 4+/5     Ambulation/Gait   Gait Comments reduced flexion/extension of R knee, reduced R ankle DF, R hip hike, reduced gait speed      6 minute walk test results    Aerobic Endurance Distance Walked 540   Endurance additional comments 3MWT     High Level Balance   High Level Balance Comments SLS 9 seconds L, 4 seconds R                            PT Education - 07/10/16 0955    Education provided Yes   Education Details prognosis, POC, HEP    Person(s) Educated Patient   Methods Explanation;Demonstration;Handout   Comprehension Verbalized understanding;Returned demonstration;Need further instruction          PT Short Term Goals - 07/10/16 1005      PT SHORT TERM GOAL #1   Title Patient to experience pain R knee no more than 3/10 in order to improve QOL and functional task tolerance    Time 4   Period Weeks   Status New     PT SHORT TERM GOAL #2   Title Patient to demonstrate gait pattern with no deviations related to pain, including improved knee ROM R, no hip hiking during gait, and increased weight acceptance R  LE in order to show general improvement of mechanics    Time 4   Period Weeks   Status New     PT SHORT TERM GOAL #3   Title Patient to demonstrate R knee ROM  0-110 degrees in order to improve mechanics and reduce pain    Time 4   Period Weeks   Status New     PT SHORT TERM GOAL #4   Title Patient to be independent in correctly and consistently performing appropriate HEP, to be updated PRN    Time 1   Period Weeks   Status New           PT Long Term Goals - 07/10/16 1009      PT LONG TERM GOAL #1   Title Patient to demonstrate functional strength at least 4+/5 all tested groups in order to assist in reducing pain and improving mechanics    Time 8   Period Weeks   Status New     PT LONG TERM GOAL #2   Title Pateint to demonstrate only moderate B hip/ankle ROM limitation in order to assist in improving mechanics and reducing pain    Time 8   Period Weeks   Status New     PT LONG TERM GOAL #3   Title Patient to be able to perform all job duties with pain no more than 3/10 for full shift in order to improve QOL    Time 8   Period Weeks   Status New     PT LONG TERM GOAL #4   Title Patient to be able to verbally explain importance of and be independent in long term exercise/activity strategies to manage knee pain in order to enhance self-efficacy in managing condition    Time 8   Period Weeks   Status New               Plan - 07/10/16 0957    Clinical Impression Statement Patient presents with chronic R knee pain lasting about one year; she states that her knee is quite painful in general and is having quite a negative effect on her regular tasks throughout her day. Upon examination, she displays significant functional muscle weakness, localized edema, chronic gait and general mobility compensations likely due to chronic compensation pattern secondary to pain, severe limitation in B hip/R knee/R ankle mobility and flexibility, tenderness to palpation around the  knee, and reduced functional task tolerance limited by pain. Unable to accurately test specific tissues in hips/R knee due to general tenderness of R knee impacting results of tests, will follow up with this later on when knee is less sensitive/tender. She will benefit from skilled PT services to address functional limitations and to assist in reaching optimal level of function.    Rehab Potential Good   Clinical Impairments Affecting Rehab Potential chronicity of condition/compensations    PT Frequency 2x / week   PT Duration 8 weeks   PT Treatment/Interventions ADLs/Self Care Home Management;Biofeedback;Cryotherapy;Moist Heat;DME Instruction;Gait training;Stair training;Functional mobility training;Therapeutic activities;Therapeutic exercise;Neuromuscular re-education;Balance training;Patient/family education;Manual techniques;Passive range of motion;Energy conservation;Taping   PT Next Visit Plan review HEP/goals; work on ROM and edema, progress with strength and balance when appropriate with ROM, address gait impairments    PT Home Exercise Plan 07/10/16: quad sets, heel slides, lateral weight shifts    Consulted and Agree with Plan of Care Patient      Patient will benefit from skilled therapeutic intervention in order to improve the following deficits and impairments:  Abnormal gait, Improper body mechanics, Pain, Decreased coordination, Decreased mobility, Postural dysfunction, Increased muscle spasms, Decreased activity tolerance, Decreased range of motion, Decreased strength, Hypomobility, Decreased balance, Difficulty walking, Impaired flexibility  Visit Diagnosis: Chronic pain of right knee -  Plan: PT plan of care cert/re-cert  Localized edema - Plan: PT plan of care cert/re-cert  Muscle weakness (generalized) - Plan: PT plan of care cert/re-cert  Other symptoms and signs involving the musculoskeletal system - Plan: PT plan of care cert/re-cert  Difficulty in walking, not elsewhere  classified - Plan: PT plan of care cert/re-cert     Problem List Patient Active Problem List   Diagnosis Date Noted  . Bilateral knee pain 11/01/2015  . Right knee pain 10/18/2015  . Left knee pain 10/18/2015  . Unsteady gait 08/10/2013  . Altered mental state 08/10/2013  . Acute encephalopathy 08/10/2013  . Headache 08/10/2013  . Hypokalemia 08/10/2013  . Anemia 08/10/2013  . Hypercholesterolemia 01/21/2013  . Chronic back pain 01/13/2013  . Hypertension 01/13/2013  . Chest pain 01/07/2013    Deniece Ree PT, DPT [facility] [address] [place], [place], [address] Phone: [phone]   Fax:  [phone]  Name: Kendra Valenzuela MRN: [id number] Date of Birth: Oct 15, 1960

## 2016-07-10 NOTE — Patient Instructions (Signed)
    QUAD SET  Tighten your top thigh muscle as you attempt to press the back of your knee downward towards the table.  Hold for 3 seconds, then relax.  Repeat 10 times, 2-3 times per day.     HEEL SLIDES - SUPINE  Lying on your back with knees straight, slide the affected heel towards your buttock as you bend your knee.   Hold a gentle stretch in this position and then return to original position. You may loop a sheet around your lower leg (like we did in clinic) to help you perform this exercise.  Repeat 10 times, 2-3 times per day.    WEIGHT SHIFT - LATERAL  While in a standing position and knees partially bent, slowly shift your body weight side-to-side. Make sure you are bearing as much weight down through your right knee as you can tolerate.  Repeat 10 times, 2-3 times per day.

## 2016-07-11 ENCOUNTER — Ambulatory Visit (HOSPITAL_COMMUNITY): Payer: BLUE CROSS/BLUE SHIELD

## 2016-07-11 DIAGNOSIS — M25561 Pain in right knee: Principal | ICD-10-CM

## 2016-07-11 DIAGNOSIS — R262 Difficulty in walking, not elsewhere classified: Secondary | ICD-10-CM

## 2016-07-11 DIAGNOSIS — G8929 Other chronic pain: Secondary | ICD-10-CM

## 2016-07-11 DIAGNOSIS — R600 Localized edema: Secondary | ICD-10-CM

## 2016-07-11 DIAGNOSIS — R29898 Other symptoms and signs involving the musculoskeletal system: Secondary | ICD-10-CM

## 2016-07-11 DIAGNOSIS — M6281 Muscle weakness (generalized): Secondary | ICD-10-CM

## 2016-07-11 NOTE — Therapy (Signed)
[place], [place], [address] Phone: [phone]   Fax:  [phone]  Physical Therapy Treatment  Patient Details  Name: Kendra Valenzuela MRN: [id number] Date of Birth: Sep 27, 1960 Referring Provider: Sanjuana Kava   Encounter Date: 07/11/2016      PT End of Session - 07/11/16 1003    Visit Number 2   Number of Visits 16   Date for PT Re-Evaluation 08/07/16   Authorization Type BCBS PPO   Authorization Time Period 07/10/16 to 09/09/16   PT Start Time 0952   PT Stop Time 1030   PT Time Calculation (min) 38 min   Activity Tolerance Patient tolerated treatment well;No increased pain   Behavior During Therapy WFL for tasks assessed/performed      Past Medical History:  Diagnosis Date  . Back pain   . Chest pain 01/2013   Admitted to [facility] in 01/2013  . Environmental allergies   . GERD (gastroesophageal reflux disease)   . Headache(784.0)   . Hypercholesteremia   . Hypertension 01/13/2013    Past Surgical History:  Procedure Laterality Date  . CARDIAC CATHETERIZATION    . CHOLECYSTECTOMY N/A 01/21/2014   Procedure: LAPAROSCOPIC CHOLECYSTECTOMY;  Surgeon: Jamesetta So, MD;  Location: [facility];  Service: General;  Laterality: N/A;  . DILATION AND CURETTAGE OF UTERUS    . LEFT HEART CATHETERIZATION WITH CORONARY ANGIOGRAM N/A 02/08/2013   Procedure: LEFT HEART CATHETERIZATION WITH CORONARY ANGIOGRAM;  Surgeon: Burnell Blanks, MD;  Location: [facility] CATH LAB;  Service: Cardiovascular;  Laterality: N/A;  . UNILATERAL SALPINGECTOMY Right    due to ectopic pregnancy    There were no vitals filed for this visit.      Subjective Assessment - 07/11/16 0957    Subjective Pt stated she had some pain yesterday due to standing on feet for prolonged period of time, feels okay today.  No reports of current pain.  Reports compliance with HEP yesterday without questions concering exercises.     Pertinent History HTN,  history of back pain, anemia    Patient Stated Goals reduce pain   Currently in Pain? No/denies                         [facility] Adult PT Treatment/Exercise - 07/11/16 0001      Ambulation/Gait   Ambulation Distance (Feet) 552 Feet   Assistive device None   Gait Pattern Step-through pattern;Decreased stance time - right   Gait Comments Cueing for heel to toe, knee flexion at toe push off, equal stance phase     Exercises   Exercises Knee/Hip     Knee/Hip Exercises: Stretches   Active Hamstring Stretch Right;3 reps;30 seconds   Active Hamstring Stretch Limitations 12in step   Knee: Self-Stretch Limitations knee drives [id number] 10" holds on 12in step   Gastroc Stretch 3 reps;30 seconds   Gastroc Stretch Limitations slant board     Knee/Hip Exercises: Standing   Rocker Board 2 minutes   Rocker Board Limitations R/L with 1 HHA   Other Standing Knee Exercises lateral weight shifting     Knee/Hip Exercises: Supine   Quad Sets Right;2 sets;10 reps   Quad Sets Limitations 5" holds   Heel Slides 10 reps     Manual Therapy   Manual Therapy Edema management   Manual therapy comments Manual complete separate rest of tx   Edema Management Retro massage with LE elevated  PT Education - 07/11/16 1041    Education provided Yes   Education Details Reviewed goals, compliance with HEP, copy of eval, instructed proper gait mechanics and purpose/technique with retro massage for edema control   Person(s) Educated Patient   Methods Explanation;Demonstration;Handout   Comprehension Verbalized understanding;Returned demonstration;Need further instruction          PT Short Term Goals - 07/10/16 1005      PT SHORT TERM GOAL #1   Title Patient to experience pain R knee no more than 3/10 in order to improve QOL and functional task tolerance    Time 4   Period Weeks   Status New     PT SHORT TERM GOAL #2   Title Patient to demonstrate gait pattern with no  deviations related to pain, including improved knee ROM R, no hip hiking during gait, and increased weight acceptance R LE in order to show general improvement of mechanics    Time 4   Period Weeks   Status New     PT SHORT TERM GOAL #3   Title Patient to demonstrate R knee ROM 0-110 degrees in order to improve mechanics and reduce pain    Time 4   Period Weeks   Status New     PT SHORT TERM GOAL #4   Title Patient to be independent in correctly and consistently performing appropriate HEP, to be updated PRN    Time 1   Period Weeks   Status New           PT Long Term Goals - 07/10/16 1009      PT LONG TERM GOAL #1   Title Patient to demonstrate functional strength at least 4+/5 all tested groups in order to assist in reducing pain and improving mechanics    Time 8   Period Weeks   Status New     PT LONG TERM GOAL #2   Title Pateint to demonstrate only moderate B hip/ankle ROM limitation in order to assist in improving mechanics and reducing pain    Time 8   Period Weeks   Status New     PT LONG TERM GOAL #3   Title Patient to be able to perform all job duties with pain no more than 3/10 for full shift in order to improve QOL    Time 8   Period Weeks   Status New     PT LONG TERM GOAL #4   Title Patient to be able to verbally explain importance of and be independent in long term exercise/activity strategies to manage knee pain in order to enhance self-efficacy in managing condition    Time 8   Period Weeks   Status New               Plan - 07/11/16 1032    Clinical Impression Statement Reviewed goals, assured correct form and technqiue with HEP and copy of eval given to pt.  Session focus on improving knee mobility and gait training.  Manual retro massage complete for edema control to assist with knee mobility.  Pt able to demonstrate appropraite form and technique with HEP with no cueing required.  Added stretches to improve LE flexibility.  Gait training  with cueing for heel to toe and to equalize stance phase with gait, added rocker board to improve weight distribution.  No reports of pain through session. pt encouraged to continue HEP and slow gait training to improve mechanics.     Rehab Potential Good  Clinical Impairments Affecting Rehab Potential chronicity of condition/compensations    PT Frequency 2x / week   PT Duration 8 weeks   PT Treatment/Interventions ADLs/Self Care Home Management;Biofeedback;Cryotherapy;Moist Heat;DME Instruction;Gait training;Stair training;Functional mobility training;Therapeutic activities;Therapeutic exercise;Neuromuscular re-education;Balance training;Patient/family education;Manual techniques;Passive range of motion;Energy conservation;Taping   PT Next Visit Plan work on ROM and edema, progress with strength and balance when appropriate with ROM, address gait impairments    PT Home Exercise Plan 07/10/16: quad sets, heel slides, lateral weight shifts       Patient will benefit from skilled therapeutic intervention in order to improve the following deficits and impairments:  Abnormal gait, Improper body mechanics, Pain, Decreased coordination, Decreased mobility, Postural dysfunction, Increased muscle spasms, Decreased activity tolerance, Decreased range of motion, Decreased strength, Hypomobility, Decreased balance, Difficulty walking, Impaired flexibility  Visit Diagnosis: Chronic pain of right knee  Localized edema  Muscle weakness (generalized)  Other symptoms and signs involving the musculoskeletal system  Difficulty in walking, not elsewhere classified     Problem List Patient Active Problem List   Diagnosis Date Noted  . Bilateral knee pain 11/01/2015  . Right knee pain 10/18/2015  . Left knee pain 10/18/2015  . Unsteady gait 08/10/2013  . Altered mental state 08/10/2013  . Acute encephalopathy 08/10/2013  . Headache 08/10/2013  . Hypokalemia 08/10/2013  . Anemia 08/10/2013  .  Hypercholesterolemia 01/21/2013  . Chronic back pain 01/13/2013  . Hypertension 01/13/2013  . Chest pain 01/07/2013   Ihor Austin, [place]; [place]  Aldona Lento 07/11/2016, 10:43 AM  [place], [place], [address] Phone: [phone]   Fax:  [phone]  Name: KHADIJHA GARRAWAY MRN: [id number] Date of Birth: [birth year]

## 2016-07-17 ENCOUNTER — Ambulatory Visit (HOSPITAL_COMMUNITY): Payer: BLUE CROSS/BLUE SHIELD

## 2016-07-17 ENCOUNTER — Telehealth: Payer: Self-pay | Admitting: Orthopaedic Surgery

## 2016-07-17 DIAGNOSIS — M25561 Pain in right knee: Secondary | ICD-10-CM | POA: Diagnosis not present

## 2016-07-17 DIAGNOSIS — M6281 Muscle weakness (generalized): Secondary | ICD-10-CM

## 2016-07-17 DIAGNOSIS — R600 Localized edema: Secondary | ICD-10-CM

## 2016-07-17 DIAGNOSIS — G8929 Other chronic pain: Secondary | ICD-10-CM

## 2016-07-17 DIAGNOSIS — R262 Difficulty in walking, not elsewhere classified: Secondary | ICD-10-CM

## 2016-07-17 DIAGNOSIS — R29898 Other symptoms and signs involving the musculoskeletal system: Secondary | ICD-10-CM

## 2016-07-17 MED ORDER — HYDROCODONE-ACETAMINOPHEN 5-325 MG PO TABS: 1.0000 | ORAL_TABLET | Freq: Four times a day (QID) | ORAL | 0 refills | Status: DC | PRN

## 2016-07-17 NOTE — Telephone Encounter (Signed)
Patient called (ph# [phone]) to ask if she may have either something prescribed, or have refill of her recent pain medication, to help her during the course of her physical therapy:  HYDROcodone-acetaminophen (NORCO/VICODIN) 5-325 MG tablet 100 tablet

## 2016-07-17 NOTE — Therapy (Signed)
[place], [place], [address] Phone: [phone]   Fax:  [phone]  Physical Therapy Treatment  Patient Details  Name: Kendra Valenzuela MRN: [id number] Date of Birth: 01/11/1961 Referring Provider: Sanjuana Kava   Encounter Date: 07/17/2016      PT End of Session - 07/17/16 0911    Visit Number 3   Number of Visits 16   Date for PT Re-Evaluation 08/07/16   Authorization Type BCBS PPO   Authorization Time Period 07/10/16 to 09/09/16   PT Start Time 0901   PT Stop Time 0941   PT Time Calculation (min) 40 min   Activity Tolerance Patient tolerated treatment well;No increased pain   Behavior During Therapy WFL for tasks assessed/performed      Past Medical History:  Diagnosis Date  . Back pain   . Chest pain 01/2013   Admitted to [facility] in 01/2013  . Environmental allergies   . GERD (gastroesophageal reflux disease)   . Headache(784.0)   . Hypercholesteremia   . Hypertension 01/13/2013    Past Surgical History:  Procedure Laterality Date  . CARDIAC CATHETERIZATION    . CHOLECYSTECTOMY N/A 01/21/2014   Procedure: LAPAROSCOPIC CHOLECYSTECTOMY;  Surgeon: Jamesetta So, MD;  Location: [facility];  Service: General;  Laterality: N/A;  . DILATION AND CURETTAGE OF UTERUS    . LEFT HEART CATHETERIZATION WITH CORONARY ANGIOGRAM N/A 02/08/2013   Procedure: LEFT HEART CATHETERIZATION WITH CORONARY ANGIOGRAM;  Surgeon: Burnell Blanks, MD;  Location: [facility] CATH LAB;  Service: Cardiovascular;  Laterality: N/A;  . UNILATERAL SALPINGECTOMY Right    due to ectopic pregnancy    There were no vitals filed for this visit.      Subjective Assessment - 07/17/16 0904    Subjective Pt has little to report this morning. No significant changes since last session. She says HEP is going fine at home. She is pain free today.    Pertinent History HTN, history of back pain, anemia    Currently in Pain? No/denies                          [facility] Adult PT Treatment/Exercise - 07/17/16 0001      Exercises   Exercises Knee/Hip     Knee/Hip Exercises: Stretches   Active Hamstring Stretch Right;3 reps;30 seconds   Active Hamstring Stretch Limitations 12in step   Knee: Self-Stretch Limitations knee drives [id number] 10" holds on 12in step  120+ degrees achieved bilat   Gastroc Stretch 30 seconds;2 reps   Gastroc Stretch Limitations slant board     Knee/Hip Exercises: Standing   Other Standing Knee Exercises lateral side stepping in hall  4x50ft bilat   Other Standing Knee Exercises Marching in place 2x10 bilat  3lb weight on ankles     Knee/Hip Exercises: Seated   Long Arc Quad 2 sets;10 reps;Strengthening;Both   Long Arc Quad Weight 3 lbs.   Long [employer] Limitations increase to 5lbs next session   Abd/Adduction Limitations Seated ankle dorsiflexion   2x15 bilat   Sit to [employer] 2 sets;10 reps  seated on dynadisc     Knee/Hip Exercises: Supine   Bridges with Ball Squeeze 2 sets;10 reps  knees starting at 90 degrees   Bridges with Clamshell 2 sets;Both  2x10, GreenTB   Other Supine Knee/Hip Exercises Reverse Crunch c Ball between knees  2x15  PT Short Term Goals - 07/10/16 1005      PT SHORT TERM GOAL #1   Title Patient to experience pain R knee no more than 3/10 in order to improve QOL and functional task tolerance    Time 4   Period Weeks   Status New     PT SHORT TERM GOAL #2   Title Patient to demonstrate gait pattern with no deviations related to pain, including improved knee ROM R, no hip hiking during gait, and increased weight acceptance R LE in order to show general improvement of mechanics    Time 4   Period Weeks   Status New     PT SHORT TERM GOAL #3   Title Patient to demonstrate R knee ROM 0-110 degrees in order to improve mechanics and reduce pain    Time 4   Period Weeks   Status New     PT SHORT TERM GOAL #4   Title Patient to  be independent in correctly and consistently performing appropriate HEP, to be updated PRN    Time 1   Period Weeks   Status New           PT Long Term Goals - 07/10/16 1009      PT LONG TERM GOAL #1   Title Patient to demonstrate functional strength at least 4+/5 all tested groups in order to assist in reducing pain and improving mechanics    Time 8   Period Weeks   Status New     PT LONG TERM GOAL #2   Title Pateint to demonstrate only moderate B hip/ankle ROM limitation in order to assist in improving mechanics and reducing pain    Time 8   Period Weeks   Status New     PT LONG TERM GOAL #3   Title Patient to be able to perform all job duties with pain no more than 3/10 for full shift in order to improve QOL    Time 8   Period Weeks   Status New     PT LONG TERM GOAL #4   Title Patient to be able to verbally explain importance of and be independent in long term exercise/activity strategies to manage knee pain in order to enhance self-efficacy in managing condition    Time 8   Period Weeks   Status New               Plan - 07/17/16 0912    Clinical Impression Statement Pt tolerating session well today, able to increase strengthening routine without significant liitations from fatigue, and without any increase in pain. Pt making good progress toward goal thus far. No changes to HEP at this time. Pt reponds well to verbal cues and visual cues for form correction. Throughout session, strong demonstration of postural weakness in abdominal flexors, with excessive relaiance on end range lumbar lordosis for postural support. This should be addressed during performance of exercises in future sessions. Pt with good awareness to foot position during lateral side stepping in hallway, after VC for avoiding toe-out.     Rehab Potential Good   Clinical Impairments Affecting Rehab Potential chronicity of condition/compensations    PT Frequency 2x / week   PT Duration 8 weeks    PT Treatment/Interventions ADLs/Self Care Home Management;Biofeedback;Cryotherapy;Moist Heat;DME Instruction;Gait training;Stair training;Functional mobility training;Therapeutic activities;Therapeutic exercise;Neuromuscular re-education;Balance training;Patient/family education;Manual techniques;Passive range of motion;Energy conservation;Taping   PT Next Visit Plan continue progress isolated strengthening of knees and hips; avoid functional exercises at  this time due to strong compensation patterns.   PT Home Exercise Plan 07/10/16: quad sets, heel slides, lateral weight shifts    Consulted and Agree with Plan of Care Patient      Patient will benefit from skilled therapeutic intervention in order to improve the following deficits and impairments:  Abnormal gait, Improper body mechanics, Pain, Decreased coordination, Decreased mobility, Postural dysfunction, Increased muscle spasms, Decreased activity tolerance, Decreased range of motion, Decreased strength, Hypomobility, Decreased balance, Difficulty walking, Impaired flexibility  Visit Diagnosis: Chronic pain of right knee  Localized edema  Muscle weakness (generalized)  Other symptoms and signs involving the musculoskeletal system  Difficulty in walking, not elsewhere classified     Problem List Patient Active Problem List   Diagnosis Date Noted  . Bilateral knee pain 11/01/2015  . Right knee pain 10/18/2015  . Left knee pain 10/18/2015  . Unsteady gait 08/10/2013  . Altered mental state 08/10/2013  . Acute encephalopathy 08/10/2013  . Headache 08/10/2013  . Hypokalemia 08/10/2013  . Anemia 08/10/2013  . Hypercholesterolemia 01/21/2013  . Chronic back pain 01/13/2013  . Hypertension 01/13/2013  . Chest pain 01/07/2013   9:39 AM, 07/17/16 Etta Grandchild, PT, DPT Physical Therapist at [facility] [phone] (office)      [place] [address] [place], [place], [address] Phone: [phone]   Fax:  [phone]  Name: Kendra Valenzuela MRN: [id number] Date of Birth: 06-May-1961

## 2016-07-19 ENCOUNTER — Ambulatory Visit (HOSPITAL_COMMUNITY): Payer: BLUE CROSS/BLUE SHIELD | Admitting: Physical Therapy

## 2016-07-19 DIAGNOSIS — R600 Localized edema: Secondary | ICD-10-CM

## 2016-07-19 DIAGNOSIS — M25561 Pain in right knee: Secondary | ICD-10-CM | POA: Diagnosis not present

## 2016-07-19 DIAGNOSIS — R262 Difficulty in walking, not elsewhere classified: Secondary | ICD-10-CM

## 2016-07-19 DIAGNOSIS — M6281 Muscle weakness (generalized): Secondary | ICD-10-CM

## 2016-07-19 DIAGNOSIS — R29898 Other symptoms and signs involving the musculoskeletal system: Secondary | ICD-10-CM

## 2016-07-19 DIAGNOSIS — G8929 Other chronic pain: Secondary | ICD-10-CM

## 2016-07-19 NOTE — Therapy (Signed)
[place], [place], [address] Phone: [phone]   Fax:  [phone]  Physical Therapy Treatment  Patient Details  Name: Kendra Valenzuela MRN: [id number] Date of Birth: 11-Mar-1961 Referring Provider: Sanjuana Kava   Encounter Date: 07/19/2016      PT End of Session - 07/19/16 0928    Visit Number 4   Number of Visits 16   Date for PT Re-Evaluation 08/07/16   Authorization Type BCBS PPO   Authorization Time Period 07/10/16 to 09/09/16   PT Start Time 0901   PT Stop Time 0941   PT Time Calculation (min) 40 min   Activity Tolerance Patient tolerated treatment well;No increased pain   Behavior During Therapy WFL for tasks assessed/performed      Past Medical History:  Diagnosis Date  . Back pain   . Chest pain 01/2013   Admitted to [facility] in 01/2013  . Environmental allergies   . GERD (gastroesophageal reflux disease)   . Headache(784.0)   . Hypercholesteremia   . Hypertension 01/13/2013    Past Surgical History:  Procedure Laterality Date  . CARDIAC CATHETERIZATION    . CHOLECYSTECTOMY N/A 01/21/2014   Procedure: LAPAROSCOPIC CHOLECYSTECTOMY;  Surgeon: Jamesetta So, MD;  Location: [facility];  Service: General;  Laterality: N/A;  . DILATION AND CURETTAGE OF UTERUS    . LEFT HEART CATHETERIZATION WITH CORONARY ANGIOGRAM N/A 02/08/2013   Procedure: LEFT HEART CATHETERIZATION WITH CORONARY ANGIOGRAM;  Surgeon: Burnell Blanks, MD;  [facility] CATH LAB;  Service: Cardiovascular;  Laterality: N/A;  . UNILATERAL SALPINGECTOMY Right    due to ectopic pregnancy    There were no vitals filed for this visit.      Subjective Assessment - 07/19/16 0904    Subjective Pt reports things are going great. She has no issues currently, just some soreness after her sessions.    Pertinent History HTN, history of back pain, anemia    Currently in Pain? No/denies                         [facility] Adult  PT Treatment/Exercise - 07/19/16 0001      Knee/Hip Exercises: Stretches   Passive Hamstring Stretch 2 reps;30 seconds;Both   Passive Hamstring Stretch Limitations 12" box    Quad Stretch 2 reps;30 seconds;Both   Sports administrator Limitations prone    Gastroc Stretch 3 reps;30 seconds   Gastroc Stretch Limitations slantboard      Knee/Hip Exercises: Standing   Heel Raises 1 set;Both;20 reps   Heel Raises Limitations toe raises   Forward Step Up Both;1 set;15 reps;Hand Hold: 0;Step Height: 6"   Other Standing Knee Exercises standing hip flexion on foam pad x10 reps each with intermittent UE support     Knee/Hip Exercises: Seated   Sit to Sand 2 sets  red TB around knees to encourage ER, cues to reach forward      Knee/Hip Exercises: Supine   Bridges with Clamshell 2 sets;Both   Bridges with Clamshell Other (comment)  green TB    Other Supine Knee/Hip Exercises straight leg bridge on 8" box with foam 3x5 reps      Knee/Hip Exercises: Sidelying   Clams x15 reps each, green TB                PT Education - 07/19/16 0927    Education provided Yes   Education Details discussed noted progress; tehcnique with therex  Person(s) Educated Patient   Methods Explanation;Demonstration;Verbal cues   Comprehension Verbalized understanding;Returned demonstration          PT Short Term Goals - 07/10/16 1005      PT SHORT TERM GOAL #1   Title Patient to experience pain R knee no more than 3/10 in order to improve QOL and functional task tolerance    Time 4   Period Weeks   Status New     PT SHORT TERM GOAL #2   Title Patient to demonstrate gait pattern with no deviations related to pain, including improved knee ROM R, no hip hiking during gait, and increased weight acceptance R LE in order to show general improvement of mechanics    Time 4   Period Weeks   Status New     PT SHORT TERM GOAL #3   Title Patient to demonstrate R knee ROM 0-110 degrees in order to improve  mechanics and reduce pain    Time 4   Period Weeks   Status New     PT SHORT TERM GOAL #4   Title Patient to be independent in correctly and consistently performing appropriate HEP, to be updated PRN    Time 1   Period Weeks   Status New           PT Long Term Goals - 07/10/16 1009      PT LONG TERM GOAL #1   Title Patient to demonstrate functional strength at least 4+/5 all tested groups in order to assist in reducing pain and improving mechanics    Time 8   Period Weeks   Status New     PT LONG TERM GOAL #2   Title Pateint to demonstrate only moderate B hip/ankle ROM limitation in order to assist in improving mechanics and reducing pain    Time 8   Period Weeks   Status New     PT LONG TERM GOAL #3   Title Patient to be able to perform all job duties with pain no more than 3/10 for full shift in order to improve QOL    Time 8   Period Weeks   Status New     PT LONG TERM GOAL #4   Title Patient to be able to verbally explain importance of and be independent in long term exercise/activity strategies to manage knee pain in order to enhance self-efficacy in managing condition    Time [place] - 07/19/16 [id number]    Clinical Impression Statement Pt continues to make good progress towards her goals with report of pain resolution and improved overall strength evident by her ability to perform progressions of exercises without significant fatigue. Discussed noted progress with pt and she feels she is much improved as well. Will consider possible d/c at the next session assuming no issues/concerns arise.    Rehab Potential Good   Clinical Impairments Affecting Rehab Potential chronicity of condition/compensations    PT Frequency 2x / week   PT Duration 8 weeks   PT Treatment/Interventions ADLs/Self Care Home Management;Biofeedback;Cryotherapy;Moist Heat;DME Instruction;Gait training;Stair training;Functional mobility  training;Therapeutic activities;Therapeutic exercise;Neuromuscular re-education;Balance training;Patient/family education;Manual techniques;Passive range of motion;Energy conservation;Taping   PT Next Visit Plan continue with functional strength progression. Consider possible d/c    PT Home Exercise Plan 07/10/16: quad sets, heel slides, lateral weight shifts    Consulted and Agree with Plan of  Care Patient      Patient will benefit from skilled therapeutic intervention in order to improve the following deficits and impairments:  Abnormal gait, Improper body mechanics, Pain, Decreased coordination, Decreased mobility, Postural dysfunction, Increased muscle spasms, Decreased activity tolerance, Decreased range of motion, Decreased strength, Hypomobility, Decreased balance, Difficulty walking, Impaired flexibility  Visit Diagnosis: Chronic pain of right knee  Localized edema  Muscle weakness (generalized)  Other symptoms and signs involving the musculoskeletal system  Difficulty in walking, not elsewhere classified     Problem List Patient Active Problem List   Diagnosis Date Noted  . Bilateral knee pain 11/01/2015  . Right knee pain 10/18/2015  . Left knee pain 10/18/2015  . Unsteady gait 08/10/2013  . Altered mental state 08/10/2013  . Acute encephalopathy 08/10/2013  . Headache 08/10/2013  . Hypokalemia 08/10/2013  . Anemia 08/10/2013  . Hypercholesterolemia 01/21/2013  . Chronic back pain 01/13/2013  . Hypertension 01/13/2013  . Chest pain 01/07/2013    9:44 AM,07/19/16 Elly Modena PT, DPT [facility] [place] [address] [place], [place], [address] Phone: [phone]   Fax:  [phone]  Name: Kendra Valenzuela MRN: [id number] Date of Birth: 05/24/1961

## 2016-07-22 ENCOUNTER — Ambulatory Visit (HOSPITAL_COMMUNITY): Payer: BLUE CROSS/BLUE SHIELD | Admitting: Physical Therapy

## 2016-07-22 DIAGNOSIS — R600 Localized edema: Secondary | ICD-10-CM

## 2016-07-22 DIAGNOSIS — R262 Difficulty in walking, not elsewhere classified: Secondary | ICD-10-CM

## 2016-07-22 DIAGNOSIS — M25561 Pain in right knee: Principal | ICD-10-CM

## 2016-07-22 DIAGNOSIS — G8929 Other chronic pain: Secondary | ICD-10-CM

## 2016-07-22 DIAGNOSIS — M6281 Muscle weakness (generalized): Secondary | ICD-10-CM

## 2016-07-22 DIAGNOSIS — R29898 Other symptoms and signs involving the musculoskeletal system: Secondary | ICD-10-CM

## 2016-07-22 NOTE — Therapy (Signed)
[place], [place], [address] Phone: [phone]   Fax:  [phone]  Physical Therapy Treatment (Discharge)  Patient Details  Name: Kendra Valenzuela MRN: [id number] Date of Birth: 1960-09-15 Referring Provider: Sanjuana Kava   Encounter Date: 07/22/2016      PT End of Session - 07/22/16 0845    Visit Number 5   Number of Visits 5   Date for PT Re-Evaluation 07/22/16   Authorization Type [employer] PPO   Authorization Time Period 07/10/16 to 09/09/16   PT Start Time 0815   PT Stop Time 0840  high level of function, DC today and no further skilled PT services appropriate    PT Time Calculation (min) 25 min   Activity Tolerance Patient tolerated treatment well   Behavior During Therapy [facility] for tasks assessed/performed      Past Medical History:  Diagnosis Date  . Back pain   . Chest pain 01/2013   Admitted to [facility] in 01/2013  . Environmental allergies   . GERD (gastroesophageal reflux disease)   . Headache(784.0)   . Hypercholesteremia   . Hypertension 01/13/2013    Past Surgical History:  Procedure Laterality Date  . CARDIAC CATHETERIZATION    . CHOLECYSTECTOMY N/A 01/21/2014   Procedure: LAPAROSCOPIC CHOLECYSTECTOMY;  Surgeon: Jamesetta So, MD;  Location: [facility];  Service: General;  Laterality: N/A;  . DILATION AND CURETTAGE OF UTERUS    . LEFT HEART CATHETERIZATION WITH CORONARY ANGIOGRAM N/A 02/08/2013   Procedure: LEFT HEART CATHETERIZATION WITH CORONARY ANGIOGRAM;  Surgeon: Burnell Blanks, MD;  Location: [facility] CATH LAB;  Service: Cardiovascular;  Laterality: N/A;  . UNILATERAL SALPINGECTOMY Right    due to ectopic pregnancy    There were no vitals filed for this visit.      Subjective Assessment - 07/22/16 0818    Subjective Patient continues to report things are going great, she only has problems when she is up on her feet a long time, probably about an hour- when this happens sitting down and  getting off of them helps    Pertinent History HTN, history of back pain, anemia    How long can you sit comfortably? 11/13- unlimited    How long can you stand comfortably? 11//13- 60 minutes    How long can you walk comfortably? 11/13- 60 minutes    Diagnostic tests imaging reveals moderate joint degeneration    Patient Stated Goals reduce pain   Currently in Pain? No/denies            [facility] PT Assessment - 07/22/16 0001      Observation/Other Assessments   Focus on Therapeutic Outcomes (FOTO)  19% limited      AROM   Right Knee Extension 0   Right Knee Flexion 127     Strength   Right Hip Flexion 4+/5   Right Hip Extension 3-/5   Right Hip ABduction 3/5   Left Hip Flexion 4/5   Left Hip Extension 3-/5   Left Hip ABduction 3+/5   Right Knee Flexion 4+/5   Right Knee Extension 4+/5   Left Knee Flexion 4+/5   Left Knee Extension 4+/5   Right Ankle Dorsiflexion 5/5   Left Ankle Dorsiflexion 5/5     6 minute walk test results    Aerobic Endurance Distance Walked 653   Endurance additional comments 3MWT  PT Education - 07/22/16 0844    Education provided Yes   Education Details DC today, progression of HEP resistance bands, importance of long term actiivty and strength in managing knee pain, [employer], senior center    [employer]) Educated Patient   Methods Explanation;Handout   Comprehension Verbalized understanding          PT Short Term Goals - 07/22/16 [phone]      PT SHORT TERM GOAL #1   Title Patient to experience pain R knee no more than 3/10 in order to improve QOL and functional task tolerance    Baseline 11/13- reports R knee can still get up to 8-9/10, this happens 2-3 times per month; on average her pain is around 3/10   Time 4   Period Weeks   Status Partially Met     PT SHORT TERM GOAL #2   Title Patient to demonstrate gait pattern with no deviations related to pain, including improved knee ROM R, no hip  hiking during gait, and increased weight acceptance R LE in order to show general improvement of mechanics    Time 4   Period Weeks   Status Achieved     PT SHORT TERM GOAL #3   Title Patient to demonstrate R knee ROM 0-110 degrees in order to improve mechanics and reduce pain    Time 4   Period Weeks   Status Achieved     PT SHORT TERM GOAL #4   Title Patient to be independent in correctly and consistently performing appropriate HEP, to be updated PRN    Baseline 11/13- reports compliance    Time 1   Period Weeks   Status Achieved           PT Long Term Goals - 07/22/16 6967      PT LONG TERM GOAL #1   Title Patient to demonstrate functional strength at least 4+/5 all tested groups in order to assist in reducing pain and improving mechanics    Time 8   Period Weeks   Status Partially Met     PT LONG TERM GOAL #2   Title Pateint to demonstrate only moderate B hip/ankle ROM limitation in order to assist in improving mechanics and reducing pain    Baseline 11/13- DNT    Time 8   Period Weeks   Status Deferred     PT LONG TERM GOAL #3   Title Patient to be able to perform all job duties with pain no more than 3/10 for full shift in order to improve QOL    Baseline 11/13- reports pain can get to 6/10   Time 8   Period Weeks   Status Not Met     PT LONG TERM GOAL #4   Title Patient to be able to verbally explain importance of and be independent in long term exercise/activity strategies to manage knee pain in order to enhance self-efficacy in managing condition    Time 8   Period Weeks   Status On-going               Plan - 07/22/16 0846    Clinical Impression Statement Re-assessment performed today due to DPT and patient reports of no functional concerns or complaints during the past couple of treatment sessions. Patient has greatly improved since her initial evaluation, with all objective measures except for proximal strength being [facility] today; she reports no  concerns or functional difficulties in performing required tasks and activities at home or at her  job. Progressed resistance bands for her current HEP and educated regarding importance of regular activity, particularly strengthening, on managing knee pain in face of chronic degenerative changes, gave information for [employer]. DC today due to high level of function/no patient complaints.    Rehab Potential Good   Clinical Impairments Affecting Rehab Potential chronicity of condition/compensations    PT Next Visit Plan DC today    Consulted and Agree with Plan of Care Patient      Patient will benefit from skilled therapeutic intervention in order to improve the following deficits and impairments:  Abnormal gait, Improper body mechanics, Pain, Decreased coordination, Decreased mobility, Postural dysfunction, Increased muscle spasms, Decreased activity tolerance, Decreased range of motion, Decreased strength, Hypomobility, Decreased balance, Difficulty walking, Impaired flexibility  Visit Diagnosis: Chronic pain of right knee  Localized edema  Muscle weakness (generalized)  Other symptoms and signs involving the musculoskeletal system  Difficulty in walking, not elsewhere classified     Problem List Patient Active Problem List   Diagnosis Date Noted  . Bilateral knee pain 11/01/2015  . Right knee pain 10/18/2015  . Left knee pain 10/18/2015  . Unsteady gait 08/10/2013  . Altered mental state 08/10/2013  . Acute encephalopathy 08/10/2013  . Headache 08/10/2013  . Hypokalemia 08/10/2013  . Anemia 08/10/2013  . Hypercholesterolemia 01/21/2013  . Chronic back pain 01/13/2013  . Hypertension 01/13/2013  . Chest pain 01/07/2013    PHYSICAL THERAPY DISCHARGE SUMMARY  Visits from Start of Care: 5  Current functional level related to goals / functional outcomes: Patient with no functional complaints- she is able to do everything she needs and wants at this time. DC today due to high  level of function.    Remaining deficits: Proximal weakness    Education / Equipment: DC today, progression of HEP resistance bands, importance of long term actiivty and strength in managing knee pain, [employer], senior center    Plan: Patient agrees to discharge.  Patient goals were partially met. Patient is being discharged due to being pleased with the current functional level.  ?????      Deniece Ree PT, DPT [place] [address] [place], [place], [address] Phone: [phone]   Fax:  [phone]  Name: Kendra Valenzuela MRN: [id number] Date of Birth: [birth year]

## 2016-07-24 ENCOUNTER — Ambulatory Visit (HOSPITAL_COMMUNITY): Payer: BLUE CROSS/BLUE SHIELD | Admitting: Physical Therapy

## 2016-07-29 ENCOUNTER — Encounter (HOSPITAL_COMMUNITY): Payer: BLUE CROSS/BLUE SHIELD | Admitting: Physical Therapy

## 2016-07-31 ENCOUNTER — Encounter (HOSPITAL_COMMUNITY): Payer: BLUE CROSS/BLUE SHIELD

## 2016-08-05 ENCOUNTER — Encounter (HOSPITAL_COMMUNITY): Payer: BLUE CROSS/BLUE SHIELD | Admitting: Physical Therapy

## 2016-08-07 ENCOUNTER — Encounter (HOSPITAL_COMMUNITY): Payer: BLUE CROSS/BLUE SHIELD

## 2016-08-08 ENCOUNTER — Encounter: Payer: Self-pay | Admitting: Orthopaedic Surgery

## 2016-08-08 ENCOUNTER — Ambulatory Visit (INDEPENDENT_AMBULATORY_CARE_PROVIDER_SITE_OTHER): Payer: BLUE CROSS/BLUE SHIELD | Admitting: Orthopaedic Surgery

## 2016-08-08 VITALS — BP 130/86 | HR 96 | Temp 97.7°F | Ht 65.5 in | Wt 179.0 lb

## 2016-08-08 DIAGNOSIS — M25561 Pain in right knee: Secondary | ICD-10-CM | POA: Diagnosis not present

## 2016-08-08 DIAGNOSIS — G8929 Other chronic pain: Secondary | ICD-10-CM

## 2016-08-08 DIAGNOSIS — I10 Essential (primary) hypertension: Secondary | ICD-10-CM

## 2016-08-08 NOTE — Progress Notes (Signed)
Patient [id number] Kendra Valenzuela, female DOB:02/21/1961, 55 y.o. [id number]  Chief Complaint  Patient presents with  . Follow-up    right knee    HPI  Kendra Valenzuela is a 55 y.o. female who has had right knee pain.  She is much improved after going to PT and beginning a home exercise program. She is walking well. She has no popping and no swelling.  She is very pleased. HPI  Body mass index is 29.33 kg/m.  ROS  Review of Systems  Constitutional:       Patient does not have Diabetes Mellitus. Patient has hypertension. Patient does not have COPD or shortness of breath. Patient does not have BMI > 35. Patient does not have current smoking history.  HENT: Positive for congestion.   Respiratory: Positive for shortness of breath.   Cardiovascular: Negative for chest pain.  Endocrine: Positive for cold intolerance.  Musculoskeletal: Positive for arthralgias, gait problem and joint swelling.    Past Medical History:  Diagnosis Date  . Back pain   . Chest pain 01/2013   Admitted to [facility] in 01/2013  . Environmental allergies   . GERD (gastroesophageal reflux disease)   . Headache(784.0)   . Hypercholesteremia   . Hypertension 01/13/2013    Past Surgical History:  Procedure Laterality Date  . CARDIAC CATHETERIZATION    . CHOLECYSTECTOMY N/A 01/21/2014   Procedure: LAPAROSCOPIC CHOLECYSTECTOMY;  Surgeon: Jamesetta So, MD;  Location: [facility];  Service: General;  Laterality: N/A;  . DILATION AND CURETTAGE OF UTERUS    . LEFT HEART CATHETERIZATION WITH CORONARY ANGIOGRAM N/A 02/08/2013   Procedure: LEFT HEART CATHETERIZATION WITH CORONARY ANGIOGRAM;  Surgeon: Burnell Blanks, MD;  Location: [facility] CATH LAB;  Service: Cardiovascular;  Laterality: N/A;  . UNILATERAL SALPINGECTOMY Right    due to ectopic pregnancy    Family History  Problem Relation Age of Onset  . Hypertension Father     Social History Social History  Substance Use Topics  . Smoking status: Never  Smoker  . Smokeless tobacco: Never Used  . Alcohol use 8.4 oz/week    14 Cans of beer per week     Comment: 2 cans of beer daily     No Known Allergies  Current Outpatient Prescriptions  Medication Sig Dispense Refill  . acetaminophen (TYLENOL) 500 MG tablet Take 1,000 mg by mouth every 6 (six) hours as needed for pain.    Marland Kitchen aspirin EC 81 MG tablet Take 81 mg by mouth daily.    . diphenhydrAMINE (BENADRYL) 25 MG tablet Take 50 mg by mouth 2 (two) times daily as needed for allergies.    Marland Kitchen HYDROcodone-acetaminophen (NORCO/VICODIN) 5-325 MG tablet Take 1 tablet by mouth every 6 (six) hours as needed for moderate pain (Must last 30 days.Do not take and drive a car or use machinery.). 100 tablet 0  . lisinopril-hydrochlorothiazide (PRINZIDE,ZESTORETIC) 20-12.5 MG tablet Take 1 tablet by mouth daily.   3  . LORazepam (ATIVAN) 1 MG tablet 1 mg every 8 (eight) hours.    . Multiple Vitamins-Minerals (MULTIVITAMIN WITH MINERALS) tablet Take 1 tablet by mouth daily.    . naproxen (NAPROSYN) 500 MG tablet Take 1 tablet (500 mg total) by mouth 2 (two) times daily with a meal. 20 tablet 0  . pravastatin (PRAVACHOL) 40 MG tablet Take 40 mg by mouth daily.   3  . rosuvastatin (CRESTOR) 10 MG tablet Take 10 mg by mouth daily.     No current facility-administered medications for  this visit.      Physical Exam  Blood pressure 130/86, pulse 96, temperature 97.7 F (36.5 C), height 5' 5.5" (1.664 m), weight 179 lb (81.2 kg).  Constitutional: overall normal hygiene, normal nutrition, well developed, normal grooming, normal body habitus. Assistive device:none  Musculoskeletal: gait and station Limp none, muscle tone and strength are normal, no tremors or atrophy is present.  .  Neurological: coordination overall normal.  Deep tendon reflex/nerve stretch intact.  Sensation normal.  Cranial nerves II-XII intact.   Skin:   Normal overall no scars, lesions, ulcers or rashes. No  psoriasis.  Psychiatric: Alert and oriented x 3.  Recent memory intact, remote memory unclear.  Normal mood and affect. Well groomed.  Good eye contact.  Cardiovascular: overall no swelling, no varicosities, no edema bilaterally, normal temperatures of the legs and arms, no clubbing, cyanosis and good capillary refill.  Lymphatic: palpation is normal.  Right knee has no effusion and full motion and normal gait.  The patient has been educated about the nature of the problem(s) and counseled on treatment options.  The patient appeared to understand what I have discussed and is in agreement with it.  Encounter Diagnoses  Name Primary?  . Chronic pain of right knee Yes  . Essential hypertension     PLAN Call if any problems.  Precautions discussed.  Continue current medications.   Return to clinic 6 weeks   Continue home exercise program.  Electronically Signed Sanjuana Kava, MD 11/30/20178:40 AM

## 2016-08-12 ENCOUNTER — Encounter (HOSPITAL_COMMUNITY): Payer: BLUE CROSS/BLUE SHIELD | Admitting: Physical Therapy

## 2016-08-14 ENCOUNTER — Encounter (HOSPITAL_COMMUNITY): Payer: BLUE CROSS/BLUE SHIELD | Admitting: Physical Therapy

## 2016-08-19 ENCOUNTER — Telehealth: Payer: Self-pay | Admitting: Orthopaedic Surgery

## 2016-08-19 ENCOUNTER — Encounter (HOSPITAL_COMMUNITY): Payer: BLUE CROSS/BLUE SHIELD | Admitting: Physical Therapy

## 2016-08-19 MED ORDER — HYDROCODONE-ACETAMINOPHEN 5-325 MG PO TABS: 1.0000 | ORAL_TABLET | Freq: Four times a day (QID) | ORAL | 0 refills | Status: DC | PRN

## 2016-08-19 NOTE — Telephone Encounter (Signed)
Hydrocodone-Acetaminophen  5/325mg  Qty 100 Tablets °

## 2016-08-21 ENCOUNTER — Encounter (HOSPITAL_COMMUNITY): Payer: BLUE CROSS/BLUE SHIELD | Admitting: Physical Therapy

## 2016-08-26 ENCOUNTER — Encounter (HOSPITAL_COMMUNITY): Payer: BLUE CROSS/BLUE SHIELD | Admitting: Physical Therapy

## 2016-08-28 ENCOUNTER — Encounter (HOSPITAL_COMMUNITY): Payer: BLUE CROSS/BLUE SHIELD | Admitting: Physical Therapy

## 2016-09-26 ENCOUNTER — Ambulatory Visit: Payer: BLUE CROSS/BLUE SHIELD | Admitting: Orthopaedic Surgery

## 2016-10-02 ENCOUNTER — Ambulatory Visit (INDEPENDENT_AMBULATORY_CARE_PROVIDER_SITE_OTHER): Payer: BLUE CROSS/BLUE SHIELD | Admitting: Orthopaedic Surgery

## 2016-10-02 ENCOUNTER — Encounter: Payer: Self-pay | Admitting: Orthopaedic Surgery

## 2016-10-02 VITALS — BP 127/82 | HR 81 | Temp 97.2°F | Ht 65.5 in | Wt 178.0 lb

## 2016-10-02 DIAGNOSIS — I10 Essential (primary) hypertension: Secondary | ICD-10-CM

## 2016-10-02 DIAGNOSIS — G8929 Other chronic pain: Secondary | ICD-10-CM | POA: Diagnosis not present

## 2016-10-02 DIAGNOSIS — M25561 Pain in right knee: Secondary | ICD-10-CM | POA: Diagnosis not present

## 2016-10-02 MED ORDER — HYDROCODONE-ACETAMINOPHEN 5-325 MG PO TABS: 1.0000 | ORAL_TABLET | Freq: Four times a day (QID) | ORAL | 0 refills | Status: DC | PRN

## 2016-10-02 NOTE — Progress Notes (Signed)
Patient [id number] Kendra Valenzuela, female DOB:[birth year], 56 y.o. [id number]  Chief Complaint  Patient presents with  . Follow-up    Chronic pain right knee    HPI  Kendra Valenzuela is a 56 y.o. female who has chronic right knee pain.  She has no giving way or locking. She has no new trauma or redness. She has swelling and popping.She has been active and stable. HPI  Body mass index is 29.17 kg/m.  ROS  Review of Systems  Constitutional:       Patient does not have Diabetes Mellitus. Patient has hypertension. Patient does not have COPD or shortness of breath. Patient does not have BMI > 35. Patient does not have current smoking history.  HENT: Positive for congestion.   Respiratory: Positive for shortness of breath.   Cardiovascular: Negative for chest pain.  Endocrine: Positive for cold intolerance.  Musculoskeletal: Positive for arthralgias, gait problem and joint swelling.    Past Medical History:  Diagnosis Date  . Back pain   . Chest pain 01/2013   Admitted to [facility] in 01/2013  . Environmental allergies   . GERD (gastroesophageal reflux disease)   . Headache(784.0)   . Hypercholesteremia   . Hypertension 01/13/2013    Past Surgical History:  Procedure Laterality Date  . CARDIAC CATHETERIZATION    . CHOLECYSTECTOMY N/A 01/21/2014   Procedure: LAPAROSCOPIC CHOLECYSTECTOMY;  Surgeon: Jamesetta So, MD;  Location: [facility]S;  Service: General;  Laterality: N/A;  . DILATION AND CURETTAGE OF UTERUS    . LEFT HEART CATHETERIZATION WITH CORONARY ANGIOGRAM N/A 02/08/2013   Procedure: LEFT HEART CATHETERIZATION WITH CORONARY ANGIOGRAM;  Surgeon: Burnell Blanks, MD;  Location: [facility] CATH LAB;  Service: Cardiovascular;  Laterality: N/A;  . UNILATERAL SALPINGECTOMY Right    due to ectopic pregnancy    Family History  Problem Relation Age of Onset  . Hypertension Father     Social History Social History  Substance Use Topics  . Smoking status: Never Smoker  .  Smokeless tobacco: Never Used  . Alcohol use 8.4 oz/week    14 Cans of beer per week     Comment: 2 cans of beer daily     No Known Allergies  Current Outpatient Prescriptions  Medication Sig Dispense Refill  . acetaminophen (TYLENOL) 500 MG tablet Take 1,000 mg by mouth every 6 (six) hours as needed for pain.    Marland Kitchen aspirin EC 81 MG tablet Take 81 mg by mouth daily.    . diphenhydrAMINE (BENADRYL) 25 MG tablet Take 50 mg by mouth 2 (two) times daily as needed for allergies.    Marland Kitchen HYDROcodone-acetaminophen (NORCO/VICODIN) 5-325 MG tablet Take 1 tablet by mouth every 6 (six) hours as needed for moderate pain (Must last 30 days.Do not take and drive a car or use machinery.). 90 tablet 0  . lisinopril-hydrochlorothiazide (PRINZIDE,ZESTORETIC) 20-12.5 MG tablet Take 1 tablet by mouth daily.   3  . LORazepam (ATIVAN) 1 MG tablet 1 mg every 8 (eight) hours.    . Multiple Vitamins-Minerals (MULTIVITAMIN WITH MINERALS) tablet Take 1 tablet by mouth daily.    . naproxen (NAPROSYN) 500 MG tablet Take 1 tablet (500 mg total) by mouth 2 (two) times daily with a meal. 20 tablet 0  . pravastatin (PRAVACHOL) 40 MG tablet Take 40 mg by mouth daily.   3  . rosuvastatin (CRESTOR) 10 MG tablet Take 10 mg by mouth daily.     No current facility-administered medications for this visit.  Physical Exam  Blood pressure 127/82, pulse 81, temperature 97.2 F (36.2 C), height 5' 5.5" (1.664 m), weight 178 lb (80.7 kg).  Constitutional: overall normal hygiene, normal nutrition, well developed, normal grooming, normal body habitus. Assistive device:none  Musculoskeletal: gait and station Limp right, muscle tone and strength are normal, no tremors or atrophy is present.  .  Neurological: coordination overall normal.  Deep tendon reflex/nerve stretch intact.  Sensation normal.  Cranial nerves II-XII intact.   Skin:   Normal overall no scars, lesions, ulcers or rashes. No psoriasis.  Psychiatric: Alert and  oriented x 3.  Recent memory intact, remote memory unclear.  Normal mood and affect. Well groomed.  Good eye contact.  Cardiovascular: overall no swelling, no varicosities, no edema bilaterally, normal temperatures of the legs and arms, no clubbing, cyanosis and good capillary refill.  Lymphatic: palpation is normal.  The right lower extremity is examined:  Inspection:  Thigh:  Non-tender and no defects  Knee has swelling 1+ effusion.                        Joint tenderness is present                        Patient is tender over the medial joint line  Lower Leg:  Has normal appearance and no tenderness or defects  Ankle:  Non-tender and no defects  Foot:  Non-tender and no defects Range of Motion:  Knee:  Range of motion is: 0-110                        Crepitus is  present  Ankle:  Range of motion is normal. Strength and Tone:  The right lower extremity has normal strength and tone. Stability:  Knee:  The knee is stable.  Ankle:  The ankle is stable.    The patient has been educated about the nature of the problem(s) and counseled on treatment options.  The patient appeared to understand what I have discussed and is in agreement with it.  Encounter Diagnoses  Name Primary?  . Chronic pain of right knee Yes  . Essential hypertension     PLAN Call if any problems.  Precautions discussed.  Continue current medications.   Return to clinic 3 months   I have renewed her pain medicine after first reviewing the state narcotic web site.  Electronically Signed Sanjuana Kava, MD 1/24/201810:53 AM

## 2016-10-08 ENCOUNTER — Ambulatory Visit: Payer: BLUE CROSS/BLUE SHIELD | Admitting: Orthopaedic Surgery

## 2016-11-01 ENCOUNTER — Telehealth: Payer: Self-pay | Admitting: Orthopaedic Surgery

## 2016-11-04 MED ORDER — HYDROCODONE-ACETAMINOPHEN 5-325 MG PO TABS: 1.0000 | ORAL_TABLET | Freq: Four times a day (QID) | ORAL | 0 refills | Status: DC | PRN

## 2016-12-03 ENCOUNTER — Other Ambulatory Visit: Payer: Self-pay | Admitting: Orthopaedic Surgery

## 2016-12-03 ENCOUNTER — Telehealth: Payer: Self-pay | Admitting: Orthopaedic Surgery

## 2016-12-03 MED ORDER — HYDROCODONE-ACETAMINOPHEN 5-325 MG PO TABS: 1.0000 | ORAL_TABLET | Freq: Four times a day (QID) | ORAL | 0 refills | Status: DC | PRN

## 2017-01-01 ENCOUNTER — Ambulatory Visit: Payer: BLUE CROSS/BLUE SHIELD | Admitting: Orthopaedic Surgery

## 2017-01-02 ENCOUNTER — Telehealth: Payer: Self-pay | Admitting: Orthopaedic Surgery

## 2017-01-02 MED ORDER — HYDROCODONE-ACETAMINOPHEN 5-325 MG PO TABS: 1.0000 | ORAL_TABLET | Freq: Four times a day (QID) | ORAL | 0 refills | Status: DC | PRN

## 2017-01-15 ENCOUNTER — Encounter: Payer: Self-pay | Admitting: Orthopaedic Surgery

## 2017-01-15 ENCOUNTER — Ambulatory Visit (INDEPENDENT_AMBULATORY_CARE_PROVIDER_SITE_OTHER): Payer: BLUE CROSS/BLUE SHIELD | Admitting: Orthopaedic Surgery

## 2017-01-15 VITALS — BP 140/92 | HR 83 | Temp 97.3°F | Ht 66.0 in | Wt 178.0 lb

## 2017-01-15 DIAGNOSIS — G8929 Other chronic pain: Secondary | ICD-10-CM

## 2017-01-15 DIAGNOSIS — M25561 Pain in right knee: Secondary | ICD-10-CM

## 2017-01-15 DIAGNOSIS — I10 Essential (primary) hypertension: Secondary | ICD-10-CM

## 2017-01-15 NOTE — Progress Notes (Signed)
CC:  I have pain of my right knee. I would like an injection.  The patient has chronic pain of the right knee.  There is no recent trauma.  There is no redness.  Injections in the past have helped.  The knee has no redness, has an effusion and crepitus present.  ROM of the right knee is 0-105.  Impression:  Chronic knee pain right  Return: 3 months  PROCEDURE NOTE:  The patient requests injections of the right knee , verbal consent was obtained.  The right knee was prepped appropriately after time out was performed.   Sterile technique was observed and injection of 1 cc of Depo-Medrol 40 mg with several cc's of plain xylocaine. Anesthesia was provided by ethyl chloride and a 20-gauge needle was used to inject the knee area. The injection was tolerated well.  A band aid dressing was applied.  The patient was advised to apply ice later today and tomorrow to the injection sight as needed.  Electronically Signed Sanjuana Kava, MD 5/9/201810:51 AM

## 2017-02-04 ENCOUNTER — Telehealth: Payer: Self-pay | Admitting: Orthopaedic Surgery

## 2017-02-04 MED ORDER — HYDROCODONE-ACETAMINOPHEN 5-325 MG PO TABS: 1.0000 | ORAL_TABLET | Freq: Four times a day (QID) | ORAL | 0 refills | Status: DC | PRN

## 2017-03-07 ENCOUNTER — Other Ambulatory Visit: Payer: Self-pay | Admitting: Orthopaedic Surgery

## 2017-03-07 ENCOUNTER — Other Ambulatory Visit: Payer: Self-pay | Admitting: *Deleted

## 2017-03-07 MED ORDER — HYDROCODONE-ACETAMINOPHEN 5-325 MG PO TABS: 1.0000 | ORAL_TABLET | Freq: Four times a day (QID) | ORAL | 0 refills | Status: DC | PRN

## 2017-04-07 ENCOUNTER — Telehealth: Payer: Self-pay

## 2017-04-07 NOTE — Telephone Encounter (Signed)
Pt is calling to set up TCS. Her call back number is [phone].

## 2017-04-08 NOTE — Telephone Encounter (Signed)
Route to [employer]

## 2017-04-08 NOTE — Telephone Encounter (Signed)
Gastroenterology Pre-Procedure Review  Request Date: Requesting Physician: DR.DONDIEGO  [facility]  PATIENT REVIEW QUESTIONS: The patient responded to the following health history questions as indicated:    1. Diabetes Melitis: NO 2. Joint replacements in the past 12 months: NO 3. Major health problems in the past 3 months: NO 4. Has an artificial valve or MVP: NO 5. Has a defibrillator: NO 6. Has been advised in past to take antibiotics in advance of a procedure like teeth cleaning: NO 7. Family history of colon cancer: NO 8. Alcohol Use: NO 9. History of sleep apnea: NO 10. History of coronary artery or other vascular stents placed within the last 12 months: NO 11. History of any prior anesthesia complications: NO    MEDICATIONS & ALLERGIES:    Patient reports the following regarding taking any blood thinners:   Plavix? NO Aspirin? YES Coumadin? NO Brilinta? NO Xarelto? NO Eliquis? NO Pradaxa? NO Savaysa? NO Effient? NO  Patient confirms/reports the following medications:  Current Outpatient Prescriptions  Medication Sig Dispense Refill  . acetaminophen (TYLENOL) 500 MG tablet Take 1,000 mg by mouth every 6 (six) hours as needed for pain.    Marland Kitchen aspirin EC 81 MG tablet Take 81 mg by mouth daily.    . diphenhydrAMINE (BENADRYL) 25 MG tablet Take 50 mg by mouth 2 (two) times daily as needed for allergies.    Marland Kitchen HYDROcodone-acetaminophen (NORCO/VICODIN) 5-325 MG tablet Take 1 tablet by mouth every 6 (six) hours as needed for moderate pain (Must last 30 days.Do not take and drive a car or use machinery.). 40 tablet 0  . lisinopril-hydrochlorothiazide (PRINZIDE,ZESTORETIC) 20-12.5 MG tablet Take 1 tablet by mouth daily.   3  . LORazepam (ATIVAN) 1 MG tablet 1 mg every 8 (eight) hours.    . Multiple Vitamins-Minerals (MULTIVITAMIN WITH MINERALS) tablet Take 1 tablet by mouth daily.    . naproxen (NAPROSYN) 500 MG tablet Take 1 tablet (500 mg total) by mouth 2 (two) times daily  with a meal. 20 tablet 0  . pravastatin (PRAVACHOL) 40 MG tablet Take 40 mg by mouth daily.   3  . rosuvastatin (CRESTOR) 10 MG tablet Take 10 mg by mouth daily.     No current facility-administered medications for this visit.     Patient confirms/reports the following allergies:  No Known Allergies  No orders of the defined types were placed in this encounter.   AUTHORIZATION INFORMATION Primary Insurance: [place],  [place] #: [id number],  Group #: [id number] Pre-Cert / Auth required:  Pre-Cert / Auth #:    SCHEDULE INFORMATION: Procedure has been scheduled as follows:  Date: , Time:   Location:   This Gastroenterology Pre-Precedure Review Form is being routed to the following provider(s): Dr.FEILDS

## 2017-04-08 NOTE — Telephone Encounter (Signed)
Pt will me call back

## 2017-04-09 NOTE — Telephone Encounter (Signed)
Will need OV due to meds (oxycodone and Ativan)

## 2017-04-10 ENCOUNTER — Telehealth: Payer: Self-pay | Admitting: Orthopaedic Surgery

## 2017-04-10 ENCOUNTER — Encounter: Payer: Self-pay | Admitting: Gastroenterology

## 2017-04-10 MED ORDER — HYDROCODONE-ACETAMINOPHEN 5-325 MG PO TABS: 1.0000 | ORAL_TABLET | Freq: Four times a day (QID) | ORAL | 0 refills | Status: DC | PRN

## 2017-04-10 NOTE — Telephone Encounter (Signed)
Patient requests refill on Hydrocodone/Acetaminophen 5-325 mgs.   Qty  40   Sig: Take 1 tablet by mouth every 6 (six) hours as needed for moderate pain (Must last 30 days.Do not take and drive a car or use machinery.).

## 2017-04-10 NOTE — Telephone Encounter (Signed)
Pt has an appointment on 05/27/17 2 8:30 with [facility]

## 2017-04-10 NOTE — Telephone Encounter (Signed)
Tried to call with no answer  

## 2017-04-16 ENCOUNTER — Ambulatory Visit: Payer: BLUE CROSS/BLUE SHIELD | Admitting: Orthopaedic Surgery

## 2017-04-22 ENCOUNTER — Ambulatory Visit (INDEPENDENT_AMBULATORY_CARE_PROVIDER_SITE_OTHER): Payer: BLUE CROSS/BLUE SHIELD | Admitting: Orthopaedic Surgery

## 2017-04-22 ENCOUNTER — Encounter: Payer: Self-pay | Admitting: Orthopaedic Surgery

## 2017-04-22 VITALS — BP 126/86 | HR 96 | Temp 98.6°F | Ht 66.0 in | Wt 176.0 lb

## 2017-04-22 DIAGNOSIS — G8929 Other chronic pain: Secondary | ICD-10-CM

## 2017-04-22 DIAGNOSIS — M25561 Pain in right knee: Secondary | ICD-10-CM | POA: Diagnosis not present

## 2017-04-22 NOTE — Progress Notes (Signed)
CC:  I have pain of my right knee. I would like an injection.  The patient has chronic pain of the right knee.  There is no recent trauma.  There is no redness.  Injections in the past have helped.  The knee has no redness, has an effusion and crepitus present.  ROM of the right knee is 0-110.  Impression:  Chronic knee pain right  Return: 3 months  PROCEDURE NOTE:  The patient requests injections of the right knee , verbal consent was obtained.  The right knee was prepped appropriately after time out was performed.   Sterile technique was observed and injection of 1 cc of Depo-Medrol 40 mg with several cc's of plain xylocaine. Anesthesia was provided by ethyl chloride and a 20-gauge needle was used to inject the knee area. The injection was tolerated well.  A band aid dressing was applied.  The patient was advised to apply ice later today and tomorrow to the injection sight as needed.  Electronically Signed Sanjuana Kava, MD 8/14/201810:51 AM

## 2017-05-07 ENCOUNTER — Telehealth: Payer: Self-pay | Admitting: Orthopaedic Surgery

## 2017-05-08 MED ORDER — HYDROCODONE-ACETAMINOPHEN 5-325 MG PO TABS: 1.0000 | ORAL_TABLET | Freq: Four times a day (QID) | ORAL | 0 refills | Status: DC | PRN

## 2017-05-27 ENCOUNTER — Telehealth: Payer: Self-pay

## 2017-05-27 ENCOUNTER — Ambulatory Visit (INDEPENDENT_AMBULATORY_CARE_PROVIDER_SITE_OTHER): Payer: BLUE CROSS/BLUE SHIELD | Admitting: Gastroenterology

## 2017-05-27 ENCOUNTER — Encounter: Payer: Self-pay | Admitting: Gastroenterology

## 2017-05-27 ENCOUNTER — Other Ambulatory Visit: Payer: Self-pay

## 2017-05-27 DIAGNOSIS — F1010 Alcohol abuse, uncomplicated: Secondary | ICD-10-CM

## 2017-05-27 DIAGNOSIS — Z1211 Encounter for screening for malignant neoplasm of colon: Secondary | ICD-10-CM | POA: Diagnosis not present

## 2017-05-27 MED ORDER — PEG 3350-KCL-NA BICARB-NACL 420 G PO SOLR: 4000.0000 mL | ORAL | 0 refills | Status: DC

## 2017-05-27 MED ORDER — PEG-KCL-NACL-NASULF-NA ASC-C 100 G PO SOLR: 1.0000 | ORAL | 0 refills | Status: DC

## 2017-05-27 NOTE — Assessment & Plan Note (Signed)
Daily alcohol use for the past 5 years. Encouraged decreased consumption, maximum of 24 ounces of beer daily with goal of avoiding daily alcohol use. Discussed advantages of quitting, risk of long-term alcohol abuse. Patient voiced understanding with plans of cutting back. Did not desire assistance at this time.

## 2017-05-27 NOTE — Telephone Encounter (Signed)
Received fax from [place] not covered by her insurance. Rx for Tri-Lyte sent to pharmacy. New instructions mailed. Called and informed pt.

## 2017-05-27 NOTE — Progress Notes (Addendum)
REVIEWED-NO ADDITIONAL RECOMMENDATIONS.  Primary Care Physician:  Lucia Gaskins, MD  Primary Gastroenterologist:  Barney Drain, MD   Chief Complaint  Patient presents with  . Colonoscopy    HPI:  Kendra Valenzuela is a 56 y.o. female here at the request of Dr. Lucia Gaskins for screening colonoscopy. Due to polypharmacy, chronic pain medication and Ativan she was brought in to discuss sedation.  No prior colonoscopy. Denies constipation, diarrhea, melena, rectal bleeding. No abdominal pain. No heartburn, dysphagia, weight loss. No family history of colon cancer.  She admits to drinking two 24 ounce beers daily and has drank daily for the past 5 years.      Current Outpatient Prescriptions  Medication Sig Dispense Refill  . aspirin EC 81 MG tablet Take 81 mg by mouth daily.    . cholecalciferol (VITAMIN D) 1000 units tablet Take 1,000 Units by mouth daily.    . diphenhydrAMINE (BENADRYL) 25 MG tablet Take 50 mg by mouth 2 (two) times daily as needed for allergies.    Marland Kitchen HYDROcodone-acetaminophen (NORCO/VICODIN) 5-325 MG tablet Take 1 tablet by mouth every 6 (six) hours as needed for moderate pain (Must last 30 days.Do not take and drive a car or use machinery.). 30 tablet 0  . lisinopril (PRINIVIL,ZESTRIL) 20 MG tablet Take 20 mg by mouth daily.    Marland Kitchen LORazepam (ATIVAN) 1 MG tablet 1 mg every 8 (eight) hours.    . rosuvastatin (CRESTOR) 10 MG tablet Take 10 mg by mouth daily.     No current facility-administered medications for this visit.     Allergies as of 05/27/2017  . (No Known Allergies)    Past Medical History:  Diagnosis Date  . Back pain   . Chest pain 01/2013   Admitted to [facility] in 01/2013  . Environmental allergies   . GERD (gastroesophageal reflux disease)   . Headache(784.0)   . Hypercholesteremia   . Hypertension 01/13/2013    Past Surgical History:  Procedure Laterality Date  . CARDIAC CATHETERIZATION    . CHOLECYSTECTOMY N/A 01/21/2014   Procedure: LAPAROSCOPIC CHOLECYSTECTOMY;  Surgeon: Jamesetta So, MD;  Location: [facility];  Service: General;  Laterality: N/A;  . DILATION AND CURETTAGE OF UTERUS    . LEFT HEART CATHETERIZATION WITH CORONARY ANGIOGRAM N/A 02/08/2013   Procedure: LEFT HEART CATHETERIZATION WITH CORONARY ANGIOGRAM;  Surgeon: Burnell Blanks, MD;  Location: [facility] CATH LAB;  Service: Cardiovascular;  Laterality: N/A;  . UNILATERAL SALPINGECTOMY Right    due to ectopic pregnancy    Family History  Problem Relation Age of Onset  . Hypertension Father   . Colon cancer Neg Hx     Social History   Social History  . Marital status: Legally Separated    Spouse name: N/A  . Number of children: N/A  . Years of education: N/A   Occupational History  . Dietitian     [facility]   Social History Main Topics  . Smoking status: Never Smoker  . Smokeless tobacco: Never Used  . Alcohol use 8.4 oz/week    14 Cans of beer per week     Comment: 2 can 24 ounce beers daily  . Drug use: No  . Sexual activity: Not on file   Other Topics Concern  . Not on file   Social History Narrative  . No narrative on file      ROS:  General: Negative for anorexia, weight loss, fever, chills, fatigue, weakness. Eyes: Negative for vision changes.  ENT: Negative for hoarseness, difficulty swallowing , nasal congestion. CV: Negative for chest pain, angina, palpitations, dyspnea on exertion, peripheral edema.  Respiratory: Negative for dyspnea at rest, dyspnea on exertion, cough, sputum, wheezing.  GI: See history of present illness. GU:  Negative for dysuria, hematuria, urinary incontinence, urinary frequency, nocturnal urination.  MS: Negative for joint pain, low back pain.  Derm: Negative for rash or itching.  Neuro: Negative for weakness, abnormal sensation, seizure, frequent headaches, memory loss, confusion.  Psych: Negative for anxiety, depression, suicidal ideation, hallucinations.  Endo:  Negative for unusual weight change.  Heme: Negative for bruising or bleeding. Allergy: Negative for rash or hives.    Physical Examination:  BP (!) 146/89   Pulse 91   Temp 98.6 F (37 C) (Oral)   Ht 5\' 6"  (1.676 m)   Wt 173 lb 9.6 oz (78.7 kg)   BMI 28.02 kg/m    General: Well-nourished, well-developed in no acute distress.  Head: Normocephalic, atraumatic.   Eyes: Conjunctiva pink, no icterus. Mouth: Oropharyngeal mucosa moist and pink , no lesions erythema or exudate. Neck: Supple without thyromegaly, masses, or lymphadenopathy.  Lungs: Clear to auscultation bilaterally.  Heart: Regular rate and rhythm, no murmurs rubs or gallops.  Abdomen: Bowel sounds are normal, nontender, nondistended, no hepatosplenomegaly or masses, no abdominal bruits or    hernia , no rebound or guarding.   Rectal: deferred Extremities: No lower extremity edema. No clubbing or deformities.  Neuro: Alert and oriented x 4 , grossly normal neurologically.  Skin: Warm and dry, no rash or jaundice.   Psych: Alert and cooperative, normal mood and affect.   Imaging Studies: No results found.

## 2017-05-27 NOTE — Telephone Encounter (Signed)
Called and informed pt of pre-op appt 06/13/17 at 8:00am. Letter mailed.

## 2017-05-27 NOTE — Patient Instructions (Signed)
1. Colonoscopy as scheduled. See separate instructions.  2. Please cut back on daily alcohol use. Recommend no more then 24 ounces of beer daily with goal of avoiding daily use.

## 2017-05-27 NOTE — Progress Notes (Signed)
cc'ed to pcp °

## 2017-05-27 NOTE — Assessment & Plan Note (Signed)
56 year old female presenting for first ever colonoscopy. Given daily alcohol use, chronic pain medication and Ativan we will plan for deep sedation in the OR.  I have discussed the risks, alternatives, benefits with regards to but not limited to the risk of reaction to medication, bleeding, infection, perforation and the patient is agreeable to proceed. Written consent to be obtained.

## 2017-06-09 ENCOUNTER — Other Ambulatory Visit: Payer: Self-pay | Admitting: Gastroenterology

## 2017-06-09 ENCOUNTER — Telehealth: Payer: Self-pay | Admitting: Orthopaedic Surgery

## 2017-06-10 ENCOUNTER — Telehealth: Payer: Self-pay

## 2017-06-10 NOTE — Telephone Encounter (Signed)
Received pt rx request for Moviprep and Tri-lyte. Tried to call pt to see if another rx needs to be sent to pharmacy. No answer at home number, no answering machine. Mobile number no longer works. Tried work number, would ring once then stop.

## 2017-06-10 NOTE — Patient Instructions (Signed)
Kendra Valenzuela  06/10/2017     @PREFPERIOPPHARMACY @   Your procedure is scheduled on  06/17/2017.  Report to [facility] at  1115  A.M.  Call this number if you have problems the morning of surgery:  [phone]   Remember:  Do not eat food or drink liquids after midnight.  Take these medicines the morning of surgery with A SIP OF WATER  Hydrocodone, lisinopril, ativan.   Do not wear jewelry, make-up or nail polish.  Do not wear lotions, powders, or perfumes, or deoderant.  Do not shave 48 hours prior to surgery.  Men may shave face and neck.  Do not bring valuables to the hospital.  [facility] is not responsible for any belongings or valuables.  Contacts, dentures or bridgework may not be worn into surgery.  Leave your suitcase in the car.  After surgery it may be brought to your room.  For patients admitted to the hospital, discharge time will be determined by your treatment team.  Patients discharged the day of surgery will not be allowed to drive home.   Name and phone number of your driver:   family Special instructions:  Follow the diet and prep instructions given to you by Dr Nona Dell office.  Please read over the following fact sheets that you were given. Anesthesia Post-op Instructions and Care and Recovery After Surgery       Colonoscopy, Adult A colonoscopy is an exam to look at the entire large intestine. During the exam, a lubricated, bendable tube is inserted into the anus and then passed into the rectum, colon, and other parts of the large intestine. A colonoscopy is often done as a part of normal colorectal screening or in response to certain symptoms, such as anemia, persistent diarrhea, abdominal pain, and blood in the stool. The exam can help screen for and diagnose medical problems, including:  Tumors.  Polyps.  Inflammation.  Areas of bleeding.  Tell a health care provider about:  Any allergies you have.  All medicines  you are taking, including vitamins, herbs, eye drops, creams, and over-the-counter medicines.  Any problems you or family members have had with anesthetic medicines.  Any blood disorders you have.  Any surgeries you have had.  Any medical conditions you have.  Any problems you have had passing stool. What are the risks? Generally, this is a safe procedure. However, problems may occur, including:  Bleeding.  A tear in the intestine.  A reaction to medicines given during the exam.  Infection (rare).  What happens before the procedure? Eating and drinking restrictions Follow instructions from your health care provider about eating and drinking, which may include:  A few days before the procedure - follow a low-fiber diet. Avoid nuts, seeds, dried fruit, raw fruits, and vegetables.  1-3 days before the procedure - follow a clear liquid diet. Drink only clear liquids, such as clear broth or bouillon, black coffee or tea, clear juice, clear soft drinks or sports drinks, gelatin dessert, and popsicles. Avoid any liquids that contain red or purple dye.  On the day of the procedure - do not eat or drink anything during the 2 hours before the procedure, or within the time period that your health care provider recommends.  Bowel prep If you were prescribed an oral bowel prep to clean out your colon:  Take it as told by your health care provider. Starting the day before your procedure, you  will need to drink a large amount of medicated liquid. The liquid will cause you to have multiple loose stools until your stool is almost clear or light green.  If your skin or anus gets irritated from diarrhea, you may use these to relieve the irritation: ? Medicated wipes, such as adult wet wipes with aloe and vitamin E. ? A skin soothing-product like petroleum jelly.  If you vomit while drinking the bowel prep, take a break for up to 60 minutes and then begin the bowel prep again. If vomiting  continues and you cannot take the bowel prep without vomiting, call your health care provider.  General instructions  Ask your health care provider about changing or stopping your regular medicines. This is especially important if you are taking diabetes medicines or blood thinners.  Plan to have someone take you home from the hospital or clinic. What happens during the procedure?  An IV tube may be inserted into one of your veins.  You will be given medicine to help you relax (sedative).  To reduce your risk of infection: ? Your health care team will wash or sanitize their hands. ? Your anal area will be washed with soap.  You will be asked to lie on your side with your knees bent.  Your health care provider will lubricate a long, thin, flexible tube. The tube will have a camera and a light on the end.  The tube will be inserted into your anus.  The tube will be gently eased through your rectum and colon.  Air will be delivered into your colon to keep it open. You may feel some pressure or cramping.  The camera will be used to take images during the procedure.  A small tissue sample may be removed from your body to be examined under a microscope (biopsy). If any potential problems are found, the tissue will be sent to a lab for testing.  If small polyps are found, your health care provider may remove them and have them checked for cancer cells.  The tube that was inserted into your anus will be slowly removed. The procedure may vary among health care providers and hospitals. What happens after the procedure?  Your blood pressure, heart rate, breathing rate, and blood oxygen level will be monitored until the medicines you were given have worn off.  Do not drive for 24 hours after the exam.  You may have a small amount of blood in your stool.  You may pass gas and have mild abdominal cramping or bloating due to the air that was used to inflate your colon during the  exam.  It is up to you to get the results of your procedure. Ask your health care provider, or the department performing the procedure, when your results will be ready. This information is not intended to replace advice given to you by your health care provider. Make sure you discuss any questions you have with your health care provider. Document Released: 08/23/2000 Document Revised: 06/26/2016 Document Reviewed: 11/07/2015 Elsevier Interactive Patient Education  2018 [employer].  Colonoscopy, Adult, Care After This sheet gives you information about how to care for yourself after your procedure. Your health care provider may also give you more specific instructions. If you have problems or questions, contact your health care provider. What can I expect after the procedure? After the procedure, it is common to have:  A small amount of blood in your stool for 24 hours after the procedure.  Some  gas.  Mild abdominal cramping or bloating.  Follow these instructions at home: General instructions   For the first 24 hours after the procedure: ? Do not drive or use machinery. ? Do not sign important documents. ? Do not drink alcohol. ? Do your regular daily activities at a slower pace than normal. ? Eat soft, easy-to-digest foods. ? Rest often.  Take over-the-counter or prescription medicines only as told by your health care provider.  It is up to you to get the results of your procedure. Ask your health care provider, or the department performing the procedure, when your results will be ready. Relieving cramping and bloating  Try walking around when you have cramps or feel bloated.  Apply heat to your abdomen as told by your health care provider. Use a heat source that your health care provider recommends, such as a moist heat pack or a heating pad. ? Place a towel between your skin and the heat source. ? Leave the heat on for 20-30 minutes. ? Remove the heat if your skin turns  bright red. This is especially important if you are unable to feel pain, heat, or cold. You may have a greater risk of getting burned. Eating and drinking  Drink enough fluid to keep your urine clear or pale yellow.  Resume your normal diet as instructed by your health care provider. Avoid heavy or fried foods that are hard to digest.  Avoid drinking alcohol for as long as instructed by your health care provider. Contact a health care provider if:  You have blood in your stool 2-3 days after the procedure. Get help right away if:  You have more than a small spotting of blood in your stool.  You pass large blood clots in your stool.  Your abdomen is swollen.  You have nausea or vomiting.  You have a fever.  You have increasing abdominal pain that is not relieved with medicine. This information is not intended to replace advice given to you by your health care provider. Make sure you discuss any questions you have with your health care provider. Document Released: 04/09/2004 Document Revised: 05/20/2016 Document Reviewed: 11/07/2015 Elsevier Interactive Patient Education  2018 [place] Anesthesia is a term that refers to techniques, procedures, and medicines that help a person stay safe and comfortable during a medical procedure. Monitored anesthesia care, or sedation, is one type of anesthesia. Your anesthesia specialist may recommend sedation if you will be having a procedure that does not require you to be unconscious, such as:  Cataract surgery.  A dental procedure.  A biopsy.  A colonoscopy.  During the procedure, you may receive a medicine to help you relax (sedative). There are three levels of sedation:  Mild sedation. At this level, you may feel awake and relaxed. You will be able to follow directions.  Moderate sedation. At this level, you will be sleepy. You may not remember the procedure.  Deep sedation. At this level, you will be  asleep. You will not remember the procedure.  The more medicine you are given, the deeper your level of sedation will be. Depending on how you respond to the procedure, the anesthesia specialist may change your level of sedation or the type of anesthesia to fit your needs. An anesthesia specialist will monitor you closely during the procedure. Let your health care provider know about:  Any allergies you have.  All medicines you are taking, including vitamins, herbs, eye drops, creams, and over-the-counter  medicines.  Any use of steroids (by mouth or as a cream).  Any problems you or family members have had with sedatives and anesthetic medicines.  Any blood disorders you have.  Any surgeries you have had.  Any medical conditions you have, such as sleep apnea.  Whether you are pregnant or may be pregnant.  Any use of cigarettes, alcohol, or street drugs. What are the risks? Generally, this is a safe procedure. However, problems may occur, including:  Getting too much medicine (oversedation).  Nausea.  Allergic reaction to medicines.  Trouble breathing. If this happens, a breathing tube may be used to help with breathing. It will be removed when you are awake and breathing on your own.  Heart trouble.  Lung trouble.  Before the procedure Staying hydrated Follow instructions from your health care provider about hydration, which may include:  Up to 2 hours before the procedure - you may continue to drink clear liquids, such as water, clear fruit juice, black coffee, and plain tea.  Eating and drinking restrictions Follow instructions from your health care provider about eating and drinking, which may include:  8 hours before the procedure - stop eating heavy meals or foods such as meat, fried foods, or fatty foods.  6 hours before the procedure - stop eating light meals or foods, such as toast or cereal.  6 hours before the procedure - stop drinking milk or drinks that  contain milk.  2 hours before the procedure - stop drinking clear liquids.  Medicines Ask your health care provider about:  Changing or stopping your regular medicines. This is especially important if you are taking diabetes medicines or blood thinners.  Taking medicines such as aspirin and ibuprofen. These medicines can thin your blood. Do not take these medicines before your procedure if your health care provider instructs you not to.  Tests and exams  You will have a physical exam.  You may have blood tests done to show: ? How well your kidneys and liver are working. ? How well your blood can clot.  General instructions  Plan to have someone take you home from the hospital or clinic.  If you will be going home right after the procedure, plan to have someone with you for 24 hours.  What happens during the procedure?  Your blood pressure, heart rate, breathing, level of pain and overall condition will be monitored.  An IV tube will be inserted into one of your veins.  Your anesthesia specialist will give you medicines as needed to keep you comfortable during the procedure. This may mean changing the level of sedation.  The procedure will be performed. After the procedure  Your blood pressure, heart rate, breathing rate, and blood oxygen level will be monitored until the medicines you were given have worn off.  Do not drive for 24 hours if you received a sedative.  You may: ? Feel sleepy, clumsy, or nauseous. ? Feel forgetful about what happened after the procedure. ? Have a sore throat if you had a breathing tube during the procedure. ? Vomit. This information is not intended to replace advice given to you by your health care provider. Make sure you discuss any questions you have with your health care provider. Document Released: 05/22/2005 Document Revised: 02/02/2016 Document Reviewed: 12/17/2015 Elsevier Interactive Patient Education  2018 [place], Care After These instructions provide you with information about caring for yourself after your procedure. Your health care provider may also give  you more specific instructions. Your treatment has been planned according to current medical practices, but problems sometimes occur. Call your health care provider if you have any problems or questions after your procedure. What can I expect after the procedure? After your procedure, it is common to:  Feel sleepy for several hours.  Feel clumsy and have poor balance for several hours.  Feel forgetful about what happened after the procedure.  Have poor judgment for several hours.  Feel nauseous or vomit.  Have a sore throat if you had a breathing tube during the procedure.  Follow these instructions at home: For at least 24 hours after the procedure:   Do not: ? Participate in activities in which you could fall or become injured. ? Drive. ? Use heavy machinery. ? Drink alcohol. ? Take sleeping pills or medicines that cause drowsiness. ? Make important decisions or sign legal documents. ? Take care of children on your own.  Rest. Eating and drinking  Follow the diet that is recommended by your health care provider.  If you vomit, drink water, juice, or soup when you can drink without vomiting.  Make sure you have little or no nausea before eating solid foods. General instructions  Have a responsible adult stay with you until you are awake and alert.  Take over-the-counter and prescription medicines only as told by your health care provider.  If you smoke, do not smoke without supervision.  Keep all follow-up visits as told by your health care provider. This is important. Contact a health care provider if:  You keep feeling nauseous or you keep vomiting.  You feel light-headed.  You develop a rash.  You have a fever. Get help right away if:  You have trouble breathing. This information is not  intended to replace advice given to you by your health care provider. Make sure you discuss any questions you have with your health care provider. Document Released: 12/17/2015 Document Revised: 04/17/2016 Document Reviewed: 12/17/2015 Elsevier Interactive Patient Education  [employer].

## 2017-06-11 MED ORDER — HYDROCODONE-ACETAMINOPHEN 5-325 MG PO TABS: 1.0000 | ORAL_TABLET | Freq: Four times a day (QID) | ORAL | 0 refills | Status: DC | PRN

## 2017-06-13 ENCOUNTER — Encounter (HOSPITAL_COMMUNITY): Payer: Self-pay

## 2017-06-13 ENCOUNTER — Encounter (HOSPITAL_COMMUNITY)
Admission: RE | Admit: 2017-06-13 | Discharge: 2017-06-13 | Disposition: A | Discharge status: Home / Self Care | Payer: BLUE CROSS/BLUE SHIELD | Source: Ambulatory Visit | Attending: Gastroenterology | Admitting: Gastroenterology

## 2017-06-13 DIAGNOSIS — Z01818 Encounter for other preprocedural examination: Secondary | ICD-10-CM | POA: Diagnosis not present

## 2017-06-13 DIAGNOSIS — I4510 Unspecified right bundle-branch block: Secondary | ICD-10-CM | POA: Diagnosis not present

## 2017-06-13 DIAGNOSIS — Z01812 Encounter for preprocedural laboratory examination: Secondary | ICD-10-CM | POA: Diagnosis not present

## 2017-06-13 HISTORY — DX: Unspecified osteoarthritis, unspecified site: M19.90

## 2017-06-13 HISTORY — DX: Anxiety disorder, unspecified: F41.9

## 2017-06-13 LAB — CBC WITH DIFFERENTIAL/PLATELET
Basophils Absolute: 0 10*3/uL (ref 0.0–0.1)
Basophils Relative: 0 %
EOS ABS: 0.2 10*3/uL (ref 0.0–0.7)
EOS PCT: 3 %
HCT: 34.5 % — ABNORMAL LOW (ref 36.0–46.0)
HEMOGLOBIN: 11 g/dL — AB (ref 12.0–15.0)
LYMPHS ABS: 2.4 10*3/uL (ref 0.7–4.0)
LYMPHS PCT: 34 %
MCH: 28.1 pg (ref 26.0–34.0)
MCHC: 31.9 g/dL (ref 30.0–36.0)
MCV: 88.2 fL (ref 78.0–100.0)
MONOS PCT: 10 %
Monocytes Absolute: 0.7 10*3/uL (ref 0.1–1.0)
NEUTROS PCT: 53 %
Neutro Abs: 3.8 10*3/uL (ref 1.7–7.7)
Platelets: 256 10*3/uL (ref 150–400)
RBC: 3.91 MIL/uL (ref 3.87–5.11)
RDW: 14.4 % (ref 11.5–15.5)
WBC: 7.2 10*3/uL (ref 4.0–10.5)

## 2017-06-13 LAB — BASIC METABOLIC PANEL
Anion gap: 9 (ref 5–15)
BUN: 14 mg/dL (ref 6–20)
CALCIUM: 9 mg/dL (ref 8.9–10.3)
CO2: 27 mmol/L (ref 22–32)
Chloride: 103 mmol/L (ref 101–111)
Creatinine, Ser: 0.67 mg/dL (ref 0.44–1.00)
GFR calc Af Amer: >60 mL/min (ref >60)
GLUCOSE: 113 mg/dL — AB (ref 65–99)
Potassium: 3.9 mmol/L (ref 3.5–5.1)
Sodium: 139 mmol/L (ref 135–145)

## 2017-06-17 ENCOUNTER — Encounter (HOSPITAL_COMMUNITY): Payer: Self-pay | Admitting: *Deleted

## 2017-06-17 ENCOUNTER — Ambulatory Visit (HOSPITAL_COMMUNITY): Payer: BLUE CROSS/BLUE SHIELD | Admitting: Anesthesiology

## 2017-06-17 ENCOUNTER — Encounter (HOSPITAL_COMMUNITY)
Admission: RE | Disposition: A | Discharge status: Home / Self Care | Payer: Self-pay | Source: Ambulatory Visit | Attending: Gastroenterology

## 2017-06-17 ENCOUNTER — Ambulatory Visit (HOSPITAL_COMMUNITY)
Admission: RE | Admit: 2017-06-17 | Discharge: 2017-06-17 | Disposition: A | Discharge status: Home / Self Care | Payer: BLUE CROSS/BLUE SHIELD | Source: Ambulatory Visit | Attending: Gastroenterology | Admitting: Gastroenterology

## 2017-06-17 DIAGNOSIS — K648 Other hemorrhoids: Secondary | ICD-10-CM | POA: Insufficient documentation

## 2017-06-17 DIAGNOSIS — E7800 Pure hypercholesterolemia, unspecified: Secondary | ICD-10-CM | POA: Insufficient documentation

## 2017-06-17 DIAGNOSIS — Z1211 Encounter for screening for malignant neoplasm of colon: Secondary | ICD-10-CM

## 2017-06-17 DIAGNOSIS — K644 Residual hemorrhoidal skin tags: Secondary | ICD-10-CM | POA: Insufficient documentation

## 2017-06-17 DIAGNOSIS — F419 Anxiety disorder, unspecified: Secondary | ICD-10-CM | POA: Diagnosis not present

## 2017-06-17 DIAGNOSIS — K635 Polyp of colon: Secondary | ICD-10-CM | POA: Diagnosis not present

## 2017-06-17 DIAGNOSIS — K5730 Diverticulosis of large intestine without perforation or abscess without bleeding: Secondary | ICD-10-CM | POA: Diagnosis not present

## 2017-06-17 DIAGNOSIS — K219 Gastro-esophageal reflux disease without esophagitis: Secondary | ICD-10-CM | POA: Insufficient documentation

## 2017-06-17 DIAGNOSIS — Z79899 Other long term (current) drug therapy: Secondary | ICD-10-CM | POA: Insufficient documentation

## 2017-06-17 DIAGNOSIS — Z7982 Long term (current) use of aspirin: Secondary | ICD-10-CM | POA: Insufficient documentation

## 2017-06-17 DIAGNOSIS — I10 Essential (primary) hypertension: Secondary | ICD-10-CM | POA: Diagnosis not present

## 2017-06-17 DIAGNOSIS — D123 Benign neoplasm of transverse colon: Secondary | ICD-10-CM

## 2017-06-17 DIAGNOSIS — Q438 Other specified congenital malformations of intestine: Secondary | ICD-10-CM | POA: Diagnosis not present

## 2017-06-17 DIAGNOSIS — Z791 Long term (current) use of non-steroidal anti-inflammatories (NSAID): Secondary | ICD-10-CM | POA: Insufficient documentation

## 2017-06-17 HISTORY — PX: POLYPECTOMY: SHX5525

## 2017-06-17 HISTORY — PX: COLONOSCOPY WITH PROPOFOL: SHX5780

## 2017-06-17 SURGERY — COLONOSCOPY WITH PROPOFOL
Anesthesia: Monitor Anesthesia Care

## 2017-06-17 MED ORDER — FENTANYL CITRATE (PF) 100 MCG/2ML IJ SOLN
25.0000 ug | Freq: Once | INTRAMUSCULAR | Status: AC
  Administered 2017-06-17: 25 ug via INTRAVENOUS

## 2017-06-17 MED ORDER — SIMETHICONE 40 MG/0.6ML PO SUSP
ORAL | Status: AC
  Filled 2017-06-17: qty 1.2

## 2017-06-17 MED ORDER — ATROPINE SULFATE 0.4 MG/ML IJ SOLN
INTRAMUSCULAR | Status: AC
  Filled 2017-06-17: qty 1

## 2017-06-17 MED ORDER — MIDAZOLAM HCL 2 MG/2ML IJ SOLN
1.0000 mg | INTRAMUSCULAR | Status: AC
  Administered 2017-06-17: 2 mg via INTRAVENOUS

## 2017-06-17 MED ORDER — MIDAZOLAM HCL 2 MG/2ML IJ SOLN
INTRAMUSCULAR | Status: AC
  Filled 2017-06-17: qty 2

## 2017-06-17 MED ORDER — PHENYLEPHRINE 40 MCG/ML (10ML) SYRINGE FOR IV PUSH (FOR BLOOD PRESSURE SUPPORT)
PREFILLED_SYRINGE | INTRAVENOUS | Status: AC
  Filled 2017-06-17: qty 10

## 2017-06-17 MED ORDER — PROPOFOL 500 MG/50ML IV EMUL
INTRAVENOUS | Status: DC | PRN
  Administered 2017-06-17: 150 ug/kg/min via INTRAVENOUS
  Administered 2017-06-17: 13:00:00 via INTRAVENOUS

## 2017-06-17 MED ORDER — PROPOFOL 10 MG/ML IV BOLUS
INTRAVENOUS | Status: DC | PRN
  Administered 2017-06-17 (×3): 10 mg via INTRAVENOUS

## 2017-06-17 MED ORDER — LIDOCAINE VISCOUS 2 % MT SOLN
OROMUCOSAL | Status: AC
  Filled 2017-06-17: qty 15

## 2017-06-17 MED ORDER — MINERAL OIL PO OIL
TOPICAL_OIL | ORAL | Status: AC
  Filled 2017-06-17: qty 60

## 2017-06-17 MED ORDER — FENTANYL CITRATE (PF) 100 MCG/2ML IJ SOLN
INTRAMUSCULAR | Status: AC
  Filled 2017-06-17: qty 2

## 2017-06-17 MED ORDER — LACTATED RINGERS IV SOLN
INTRAVENOUS | Status: DC
  Administered 2017-06-17: 11:00:00 via INTRAVENOUS

## 2017-06-17 MED ORDER — PROPOFOL 10 MG/ML IV BOLUS
INTRAVENOUS | Status: AC
  Filled 2017-06-17: qty 20

## 2017-06-17 NOTE — Anesthesia Postprocedure Evaluation (Signed)
Anesthesia Post Note  Patient: Kendra Valenzuela  Procedure(s) Performed: COLONOSCOPY WITH PROPOFOL (N/A ) POLYPECTOMY  Patient location during evaluation: Short Stay Anesthesia Type: MAC Level of consciousness: awake and alert and oriented Pain management: pain level controlled Vital Signs Assessment: post-procedure vital signs reviewed and stable Respiratory status: spontaneous breathing Cardiovascular status: blood pressure returned to baseline Postop Assessment: no apparent nausea or vomiting Anesthetic complications: no     Last Vitals:  Vitals:   06/17/17 1315 06/17/17 1330  BP: 102/68 (!) 133/92  Pulse: 74 72  Resp: 19 (!) 21  Temp:    SpO2: 98% 99%    Last Pain:  Vitals:   06/17/17 1110  TempSrc: Oral                 Annete Ayuso

## 2017-06-17 NOTE — Anesthesia Preprocedure Evaluation (Signed)
Anesthesia Evaluation  Patient identified by MRN, date of birth, ID band Patient awake    Reviewed: Allergy & Precautions, H&P , NPO status , Patient's Chart, lab work & pertinent test results, reviewed documented beta blocker date and time   Airway Mallampati: I  TM Distance: >3 FB Neck ROM: Full    Dental  (+) Teeth Intact   Pulmonary    breath sounds clear to auscultation       Cardiovascular hypertension, Pt. on medications and Pt. on home beta blockers  Rhythm:Regular Rate:Normal     Neuro/Psych  Headaches, PSYCHIATRIC DISORDERS Anxiety    GI/Hepatic GERD  Medicated and Controlled,  Endo/Other    Renal/GU      Musculoskeletal Chronic LBP    Abdominal   Peds  Hematology  (+) anemia ,   Anesthesia Other Findings   Reproductive/Obstetrics                             Anesthesia Physical Anesthesia Plan  ASA: III  Anesthesia Plan: MAC   Post-op Pain Management:    Induction: Intravenous  PONV Risk Score and Plan:   Airway Management Planned: Simple Face Mask  Additional Equipment:   Intra-op Plan:   Post-operative Plan:   Informed Consent: I have reviewed the patients History and Physical, chart, labs and discussed the procedure including the risks, benefits and alternatives for the proposed anesthesia with the patient or authorized representative who has indicated his/her understanding and acceptance.     Plan Discussed with:   Anesthesia Plan Comments:         Anesthesia Quick Evaluation

## 2017-06-17 NOTE — Discharge Instructions (Signed)
You had 1 polyp removed. You have SMALL internal hemorrhoids AND DIVERTICULOSIS IN YOUR LEFT COLON.   DRINK WATER TO KEEP YOUR URINE LIGHT YELLOW.  FOLLOW A HIGH FIBER DIET. AVOID ITEMS THAT CAUSE BLOATING & GAS. SEE INFO BELOW.  YOUR BIOPSY RESULTS WILL BE AVAILABLE IN MY CHART AFTER OCT 12 AND MY OFFICE WILL CONTACT YOU IN 10-14 DAYS WITH YOUR RESULTS.   Next colonoscopy in 5-10 years.    Colonoscopy Care After Read the instructions outlined below and refer to this sheet in the next week. These discharge instructions provide you with general information on caring for yourself after you leave the hospital. While your treatment has been planned according to the most current medical practices available, unavoidable complications occasionally occur. If you have any problems or questions after discharge, call DR. Marlyne Totaro, [phone].  ACTIVITY  You may resume your regular activity, but move at a slower pace for the next 24 hours.   Take frequent rest periods for the next 24 hours.   Walking will help get rid of the air and reduce the bloated feeling in your belly (abdomen).   No driving for 24 hours (because of the medicine (anesthesia) used during the test).   You may shower.   Do not sign any important legal documents or operate any machinery for 24 hours (because of the anesthesia used during the test).    NUTRITION  Drink plenty of fluids.   You may resume your normal diet as instructed by your doctor.   Begin with a light meal and progress to your normal diet. Heavy or fried foods are harder to digest and may make you feel sick to your stomach (nauseated).   Avoid alcoholic beverages for 24 hours or as instructed.    MEDICATIONS  You may resume your normal medications.   WHAT YOU CAN EXPECT TODAY  Some feelings of bloating in the abdomen.   Passage of more gas than usual.   Spotting of blood in your stool or on the toilet paper  .  IF YOU HAD POLYPS REMOVED  DURING THE COLONOSCOPY:  Eat a soft diet IF YOU HAVE NAUSEA, BLOATING, ABDOMINAL PAIN, OR VOMITING.    FINDING OUT THE RESULTS OF YOUR TEST Not all test results are available during your visit. DR. Oneida Alar WILL CALL YOU WITHIN 14 DAYS OF YOUR PROCEDUE WITH YOUR RESULTS. Do not assume everything is normal if you have not heard from DR. Corabelle Spackman, CALL HER OFFICE AT [phone].  SEEK IMMEDIATE MEDICAL ATTENTION AND CALL THE OFFICE: [phone] IF:  You have more than a spotting of blood in your stool.   Your belly is swollen (abdominal distention).   You are nauseated or vomiting.   You have a temperature over 101F.   You have abdominal pain or discomfort that is severe or gets worse throughout the day.   High-Fiber Diet A high-fiber diet changes your normal diet to include more whole grains, legumes, fruits, and vegetables. Changes in the diet involve replacing refined carbohydrates with unrefined foods. The calorie level of the diet is essentially unchanged. The Dietary Reference Intake (recommended amount) for adult males is 38 grams per day. For adult females, it is 25 grams per day. Pregnant and lactating women should consume 28 grams of fiber per day. Fiber is the intact part of a plant that is not broken down during digestion. Functional fiber is fiber that has been isolated from the plant to provide a beneficial effect in the body. PURPOSE  Increase stool bulk.   Ease and regulate bowel movements.   Lower cholesterol.   REDUCE RISK OF COLON CANCER  INDICATIONS THAT YOU NEED MORE FIBER  Constipation and hemorrhoids.   Uncomplicated diverticulosis (intestine condition) and irritable bowel syndrome.   Weight management.   As a protective measure against hardening of the arteries (atherosclerosis), diabetes, and cancer.   GUIDELINES FOR INCREASING FIBER IN THE DIET  Start adding fiber to the diet slowly. A gradual increase of about 5 more grams (2 slices of whole-wheat  bread, 2 servings of most fruits or vegetables, or 1 bowl of high-fiber cereal) per day is best. Too rapid an increase in fiber may result in constipation, flatulence, and bloating.   Drink enough water and fluids to keep your urine clear or pale yellow. Water, juice, or caffeine-free drinks are recommended. Not drinking enough fluid may cause constipation.   Eat a variety of high-fiber foods rather than one type of fiber.   Try to increase your intake of fiber through using high-fiber foods rather than fiber pills or supplements that contain small amounts of fiber.   The goal is to change the types of food eaten. Do not supplement your present diet with high-fiber foods, but replace foods in your present diet.   INCLUDE A VARIETY OF FIBER SOURCES  Replace refined and processed grains with whole grains, canned fruits with fresh fruits, and incorporate other fiber sources. White rice, white breads, and most bakery goods contain little or no fiber.   Brown whole-grain rice, buckwheat oats, and many fruits and vegetables are all good sources of fiber. These include: broccoli, Brussels sprouts, cabbage, cauliflower, beets, sweet potatoes, white potatoes (skin on), carrots, tomatoes, eggplant, squash, berries, fresh fruits, and dried fruits.   Cereals appear to be the richest source of fiber. Cereal fiber is found in whole grains and bran. Bran is the fiber-rich outer coat of cereal grain, which is largely removed in refining. In whole-grain cereals, the bran remains. In breakfast cereals, the largest amount of fiber is found in those with "bran" in their names. The fiber content is sometimes indicated on the label.   You may need to include additional fruits and vegetables each day.   In baking, for 1 cup white flour, you may use the following substitutions:   1 cup whole-wheat flour minus 2 tablespoons.   1/2 cup white flour plus 1/2 cup whole-wheat flour.   Polyps, Colon  A polyp is extra  tissue that grows inside your body. Colon polyps grow in the large intestine. The large intestine, also called the colon, is part of your digestive system. It is a long, hollow tube at the end of your digestive tract where your body makes and stores stool. Most polyps are not dangerous. They are benign. This means they are not cancerous. But over time, some types of polyps can turn into cancer. Polyps that are smaller than a pea are usually not harmful. But larger polyps could someday become or may already be cancerous. To be safe, doctors remove all polyps and test them.   WHO GETS POLYPS? Anyone can get polyps, but certain people are more likely than others. You may have a greater chance of getting polyps if:  You are over 50.   You have had polyps before.   Someone in your family has had polyps.   Someone in your family has had cancer of the large intestine.   Find out if someone in your family has had  polyps. You may also be more likely to get polyps if you:   Eat a lot of fatty foods   Smoke   Drink alcohol   Do not exercise  Eat too much   PREVENTION There is not one sure way to prevent polyps. You might be able to lower your risk of getting them if you:  Eat more fruits and vegetables and less fatty food.   Do not smoke.   Avoid alcohol.   Exercise every day.   Lose weight if you are overweight.   Eating more calcium and folate can also lower your risk of getting polyps. Some foods that are rich in calcium are milk, cheese, and broccoli. Some foods that are rich in folate are chickpeas, kidney beans, and spinach.   Hemorrhoids Hemorrhoids are dilated (enlarged) veins around the rectum. Sometimes clots will form in the veins. This makes them swollen and painful. These are called thrombosed hemorrhoids. Causes of hemorrhoids include:  Constipation.   Straining to have a bowel movement.   HEAVY LIFTING  HOME CARE INSTRUCTIONS  Eat a well balanced diet and drink 6  to 8 glasses of water every day to avoid constipation. You may also use a bulk laxative.   Avoid straining to have bowel movements.   Keep anal area dry and clean.   Do not use a donut shaped pillow or sit on the toilet for long periods. This increases blood pooling and pain.   Move your bowels when your body has the urge; this will require less straining and will decrease pain and pressure.

## 2017-06-17 NOTE — Op Note (Signed)
[facility] Patient Name: Kendra Valenzuela Procedure Date: 06/17/2017 12:10 PM MRN: [id number] Date of Birth: [birth year] Attending MD: Barney Drain MD, MD CSN: [id number] Age: 56 Admit Type: Outpatient Procedure:                Colonoscopy WITH SNARE CAUTERY POLYPECTOMY Indications:              Screening for colorectal malignant neoplasm Providers:                Barney Drain MD, MD, Rosina Lowenstein, RN, Aram Candela Referring MD:             Ralene Bathe. Dondiego, MD Medicines:                Propofol per Anesthesia Complications:            No immediate complications. Estimated Blood Loss:     Estimated blood loss: none. Procedure:                Pre-Anesthesia Assessment:                           - Prior to the procedure, a History and Physical                            was performed, and patient medications and                            allergies were reviewed. The patient's tolerance of                            previous anesthesia was also reviewed. The risks                            and benefits of the procedure and the sedation                            options and risks were discussed with the patient.                            All questions were answered, and informed consent                            was obtained. Prior Anticoagulants: The patient has                            taken naproxen, last dose was 1 day prior to                            procedure. ASA Grade Assessment: II - A patient                            with mild systemic disease. After reviewing the                            risks and benefits, the patient was deemed in  satisfactory condition to undergo the procedure.                            After obtaining informed consent, the colonoscope                            was passed under direct vision. Throughout the                            procedure, the patient's blood pressure, pulse, and       oxygen saturations were monitored continuously. The                            EC38-[id number]) scope was introduced through                            the anus and advanced to the the cecum, identified                            by appendiceal orifice and ileocecal valve. The                            colonoscopy was somewhat difficult due to a                            tortuous colon. Successful completion of the                            procedure was aided by COLOWRAP. The patient                            tolerated the procedure well. The quality of the                            bowel preparation was good. The ileocecal valve,                            appendiceal orifice, and rectum were photographed. Scope In: 12:42:48 PM Scope Out: 12:54:00 PM Scope Withdrawal Time: 0 hours 8 minutes 54 seconds  Total Procedure Duration: 0 hours 11 minutes 12 seconds  Findings:      A 6 mm polyp was found in the proximal transverse colon. The polyp was       sessile. The polyp was removed with a hot snare. Resection and retrieval       were complete.      Multiple small and large-mouthed diverticula were found in the entire       colon.      The recto-sigmoid colon, sigmoid colon and descending colon were       moderately redundant.      External and internal hemorrhoids were found during retroflexion. The       hemorrhoids were moderate. Impression:               - One 6 mm polyp in the proximal transverse colon,  removed with a hot snare. Resected and retrieved.                           - Diverticulosis in the entire examined colon.                           - Redundant LEFT colon.                           - External and internal hemorrhoids. Moderate Sedation:      Per Anesthesia Care Recommendation:           - Repeat colonoscopy in 5-10 years for surveillance                            W/ COLOWRAP.                           - High fiber diet.                            - Continue present medications.                           - Await pathology results.                           - Patient has a contact number available for                            emergencies. The signs and symptoms of potential                            delayed complications were discussed with the                            patient. Return to normal activities tomorrow.                            Written discharge instructions were provided to the                            patient. Procedure Code(s):        --- Professional ---                           [phone], Colonoscopy, flexible; with removal of                            tumor(s), polyp(s), or other lesion(s) by snare                            technique Diagnosis Code(s):        --- Professional ---                           Z12.11, Encounter for screening for malignant  neoplasm of colon                           D12.3, Benign neoplasm of transverse colon (hepatic                            flexure or splenic flexure)                           K64.8, Other hemorrhoids                           K57.30, Diverticulosis of large intestine without                            perforation or abscess without bleeding                           Q43.8, Other specified congenital malformations of                            intestine CPT copyright 2016 [employer]. All rights reserved. The codes documented in this report are preliminary and upon coder review may  be revised to meet current compliance requirements. Barney Drain, MD Barney Drain MD, MD 06/17/2017 1:08:45 PM This report has been signed electronically. Number of Addenda: 0

## 2017-06-17 NOTE — Transfer of Care (Signed)
Immediate Anesthesia Transfer of Care Note  Patient: Kendra Valenzuela  Procedure(s) Performed: COLONOSCOPY WITH PROPOFOL (N/A ) POLYPECTOMY  Patient Location: PACU  Anesthesia Type:MAC  Level of Consciousness: awake and alert   Airway & Oxygen Therapy: Patient Spontanous Breathing  Post-op Assessment: Report given to RN  Post vital signs: Reviewed  Last Vitals:  Vitals:   06/17/17 1211 06/17/17 1224  BP:  (!) 142/90  Pulse:    Resp: 19 (!) 26  Temp:    SpO2: 100% 100%    Last Pain:  Vitals:   06/17/17 1110  TempSrc: Oral      Patients Stated Pain Goal: 8 (35/00/93 8182)  Complications: No apparent anesthesia complications

## 2017-06-17 NOTE — H&P (Signed)
Primary Care Physician:  Lucia Gaskins, MD Primary Gastroenterologist:  Dr. Oneida Alar  Pre-Procedure History & Physical: HPI:  Kendra Valenzuela is a 56 y.o. female here for [place].  Past Medical History:  Diagnosis Date  . Anxiety   . Arthritis   . Back pain   . Chest pain 01/2013   Admitted to [facility] in 01/2013  . Environmental allergies   . GERD (gastroesophageal reflux disease)   . Headache(784.0)   . Hypercholesteremia   . Hypertension 01/13/2013    Past Surgical History:  Procedure Laterality Date  . CARDIAC CATHETERIZATION    . CHOLECYSTECTOMY N/A 01/21/2014   Procedure: LAPAROSCOPIC CHOLECYSTECTOMY;  Surgeon: Jamesetta So, MD;  Location: [facility]S;  Service: General;  Laterality: N/A;  . DILATION AND CURETTAGE OF UTERUS    . LEFT HEART CATHETERIZATION WITH CORONARY ANGIOGRAM N/A 02/08/2013   Procedure: LEFT HEART CATHETERIZATION WITH CORONARY ANGIOGRAM;  Surgeon: Burnell Blanks, MD;  Location: [facility] CATH LAB;  Service: Cardiovascular;  Laterality: N/A;  . UNILATERAL SALPINGECTOMY Right    due to ectopic pregnancy    Prior to Admission medications   Medication Sig Start Date End Date Taking? Authorizing Provider  cholecalciferol (VITAMIN D) 1000 units tablet Take 1,000 Units by mouth daily.   Yes [provider]  diphenhydrAMINE (BENADRYL) 25 MG tablet Take 50 mg by mouth 2 (two) times daily as needed for allergies.   Yes [provider]  HYDROcodone-acetaminophen (NORCO/VICODIN) 5-325 MG tablet Take 1 tablet by mouth every 6 (six) hours as needed for moderate pain (Must last 30 days.Do not take and drive a car or use machinery.). 06/11/17  Yes Sanjuana Kava, MD  lisinopril (PRINIVIL,ZESTRIL) 20 MG tablet Take 20 mg by mouth daily.   Yes [provider]  LORazepam (ATIVAN) 1 MG tablet Take 1 mg by mouth every 8 (eight) hours.    Yes [provider]  naproxen (NAPROSYN) 500 MG tablet Take 500 mg by mouth 2 (two) times  daily with a meal.   Yes [provider]  rosuvastatin (CRESTOR) 10 MG tablet Take 10 mg by mouth daily.   Yes [provider]  aspirin EC 81 MG tablet Take 81 mg by mouth daily.    [provider]    Allergies as of 05/27/2017  . (No Known Allergies)    Family History  Problem Relation Age of Onset  . Hypertension Father   . Colon cancer Neg Hx     Social History   Social History  . Marital status: Legally Separated    Spouse name: N/A  . Number of children: N/A  . Years of education: N/A   Occupational History  . Dietitian     [facility]   Social History Main Topics  . Smoking status: Never Smoker  . Smokeless tobacco: Never Used  . Alcohol use 8.4 oz/week    14 Cans of beer per week     Comment: 2 can 24 ounce beers daily  . Drug use: No  . Sexual activity: Not on file   Other Topics Concern  . Not on file   Social History Narrative  . No narrative on file    Review of Systems: See HPI, otherwise negative ROS   Physical Exam: BP 122/81   Pulse 98   Temp 98.6 F (37 C) (Oral)   Resp 19   Ht 5\' 6"  (1.676 m)   Wt 176 lb (79.8 kg)   SpO2 100%  BMI 28.41 kg/m  General:   Alert,  pleasant and cooperative in NAD Head:  Normocephalic and atraumatic. Neck:  Supple; Lungs:  Clear throughout to auscultation.    Heart:  Regular rate and rhythm. Abdomen:  Soft, nontender and nondistended. Normal bowel sounds, without guarding, and without rebound.   Neurologic:  Alert and  oriented x4;  grossly normal neurologically.  Impression/Plan:     SCREENING  Plan:  1. TCS TODAY DISCUSSED PROCEDURE, BENEFITS, & RISKS: < 1% chance of medication reaction, bleeding, perforation, or rupture of spleen/liver.

## 2017-06-19 ENCOUNTER — Encounter (HOSPITAL_COMMUNITY): Payer: Self-pay | Admitting: Gastroenterology

## 2017-07-02 NOTE — Progress Notes (Signed)
Tried to call the patient, no answer.

## 2017-07-03 NOTE — Progress Notes (Signed)
PATIENT ON RECALL  °

## 2017-07-03 NOTE — Progress Notes (Signed)
Patient made aware of results and stated she will make an appt with her pcp in regards to abnormal ECG.

## 2017-07-03 NOTE — Progress Notes (Signed)
Patient made aware of results.  

## 2017-07-09 ENCOUNTER — Telehealth: Payer: Self-pay | Admitting: Orthopaedic Surgery

## 2017-07-10 MED ORDER — HYDROCODONE-ACETAMINOPHEN 5-325 MG PO TABS: 1.0000 | ORAL_TABLET | Freq: Four times a day (QID) | ORAL | 0 refills | Status: DC | PRN

## 2017-07-23 ENCOUNTER — Ambulatory Visit: Payer: BLUE CROSS/BLUE SHIELD | Admitting: Orthopaedic Surgery

## 2017-07-23 ENCOUNTER — Encounter: Payer: Self-pay | Admitting: Orthopaedic Surgery

## 2017-07-23 VITALS — BP 121/88 | HR 97 | Temp 97.8°F | Ht 66.0 in | Wt 175.0 lb

## 2017-07-23 DIAGNOSIS — G8929 Other chronic pain: Secondary | ICD-10-CM | POA: Diagnosis not present

## 2017-07-23 DIAGNOSIS — M25561 Pain in right knee: Secondary | ICD-10-CM

## 2017-07-23 NOTE — Progress Notes (Signed)
CC:  I have pain of my right knee. I would like an injection.  The patient has chronic pain of the right knee.  There is no recent trauma.  There is no redness.  Injections in the past have helped.  The knee has no redness, has an effusion and crepitus present.  ROM of the right knee is 0-110.  Impression:  Chronic knee pain right  Return: 3 months  PROCEDURE NOTE:  The patient requests injections of the right knee , verbal consent was obtained.  The right knee was prepped appropriately after time out was performed.   Sterile technique was observed and injection of 1 cc of Depo-Medrol 40 mg with several cc's of plain xylocaine. Anesthesia was provided by ethyl chloride and a 20-gauge needle was used to inject the knee area. The injection was tolerated well.  A band aid dressing was applied.  The patient was advised to apply ice later today and tomorrow to the injection sight as needed.  Electronically Signed Sanjuana Kava, MD 11/14/20188:29 AM

## 2017-08-12 ENCOUNTER — Other Ambulatory Visit: Payer: Self-pay | Admitting: Orthopaedic Surgery

## 2017-08-12 MED ORDER — HYDROCODONE-ACETAMINOPHEN 5-325 MG PO TABS: 1.0000 | ORAL_TABLET | Freq: Four times a day (QID) | ORAL | 0 refills | Status: DC | PRN

## 2017-09-15 ENCOUNTER — Other Ambulatory Visit: Payer: Self-pay | Admitting: Orthopaedic Surgery

## 2017-09-16 NOTE — Telephone Encounter (Signed)
No more narcotics.  Take tylenol, Advil or Aleve.

## 2017-10-23 ENCOUNTER — Ambulatory Visit: Payer: BLUE CROSS/BLUE SHIELD | Admitting: Orthopaedic Surgery

## 2017-11-24 ENCOUNTER — Ambulatory Visit (HOSPITAL_COMMUNITY)
Admission: RE | Admit: 2017-11-24 | Discharge: 2017-11-24 | Disposition: A | Discharge status: Home / Self Care | Payer: BLUE CROSS/BLUE SHIELD | Source: Ambulatory Visit | Attending: Family Medicine | Admitting: Family Medicine

## 2017-11-24 ENCOUNTER — Other Ambulatory Visit (HOSPITAL_COMMUNITY): Payer: Self-pay | Admitting: Family Medicine

## 2017-11-24 DIAGNOSIS — G8929 Other chronic pain: Secondary | ICD-10-CM | POA: Diagnosis present

## 2017-11-24 DIAGNOSIS — M1711 Unilateral primary osteoarthritis, right knee: Secondary | ICD-10-CM | POA: Diagnosis not present

## 2017-11-24 DIAGNOSIS — M25561 Pain in right knee: Secondary | ICD-10-CM

## 2018-02-11 ENCOUNTER — Ambulatory Visit: Payer: BLUE CROSS/BLUE SHIELD | Admitting: Orthopedic Surgery

## 2018-02-11 ENCOUNTER — Encounter: Payer: Self-pay | Admitting: Orthopedic Surgery

## 2018-02-11 VITALS — BP 137/83 | HR 93 | Ht 66.0 in | Wt 184.0 lb

## 2018-02-11 DIAGNOSIS — M1711 Unilateral primary osteoarthritis, right knee: Secondary | ICD-10-CM

## 2018-02-11 NOTE — Progress Notes (Signed)
Progress Note   Patient ID: Kendra Valenzuela, female   DOB: 09-14-1960, 57 y.o.   MRN: [id number] Chief Complaint  Patient presents with  . Knee Pain    right knee pain for years worse with activity         Medical decision-making  Imaging:  Xrays ordered: y/n ? NO  The x-ray done in March of this year 4 views right knee show a mildly arthritic knee not too bad probably mild arthritis with mild joint space narrowing  Encounter Diagnosis  Name Primary?  . Primary osteoarthritis of right knee Yes     PLAN:  Return in 6 months for x-rays of the right knee  Continue controlling your weight  Take your Naprosyn  Use the knee brace  What You Need to Know About Prescription Opioid Pain Medicine    FURTHER WORKUP: NO    No orders of the defined types were placed in this encounter.     Chief Complaint  Patient presents with  . Knee Pain    right knee pain for years worse with activity     This is a 57 year old female referred by Dr. Cindie Laroche for evaluation of chronic right knee pain which she is had for several years  Current treatment includes Naprosyn and knee injections  She is on oxycodone for back and knee pain which she says does control the pain however  She is continued to have dull aching pain about the right knee she denies any associated swelling and the pain is is chronic now greater than 6 months.    Review of Systems  Constitutional: Negative for fever.  Musculoskeletal: Positive for back pain.  Skin: Negative.    Current Meds  Medication Sig  . aspirin EC 81 MG tablet Take 81 mg by mouth daily.  . diphenhydrAMINE (BENADRYL) 25 MG tablet Take 50 mg by mouth 2 (two) times daily as needed for allergies.  Marland Kitchen lisinopril (PRINIVIL,ZESTRIL) 20 MG tablet Take 20 mg by mouth daily.  Marland Kitchen lisinopril-hydrochlorothiazide (PRINZIDE,ZESTORETIC) 20-12.5 MG tablet   . naproxen (NAPROSYN) 500 MG tablet Take 500 mg by mouth 2 (two) times daily with a meal.   . oxyCODONE (OXY IR/ROXICODONE) 5 MG immediate release tablet   . rosuvastatin (CRESTOR) 10 MG tablet Take 10 mg by mouth daily.    Past Medical History:  Diagnosis Date  . Anxiety   . Arthritis   . Back pain   . Chest pain 01/2013   Admitted to [facility] in 01/2013  . Environmental allergies   . GERD (gastroesophageal reflux disease)   . Headache(784.0)   . Hypercholesteremia   . Hypertension 01/13/2013     No Known Allergies  BP 137/83   Pulse 93   Ht 5\' 6"  (1.676 m)   Wt 184 lb (83.5 kg)   BMI 29.70 kg/m    Physical Exam  Constitutional: She is oriented to person, place, and time. She appears well-developed and well-nourished.  Musculoskeletal:       Right knee: She exhibits no effusion.  Neurological: She is alert and oriented to person, place, and time.  Psychiatric: She has a normal mood and affect. Judgment normal.  Vitals reviewed.   Right Knee Exam   Muscle Strength  The patient has normal right knee strength.  Tenderness  Right knee tenderness location: Diffuse global tenderness.  Range of Motion  Extension: normal  Flexion: normal   Tests  McMurray:  Medial - negative Lateral - negative Varus: negative Valgus: negative  Drawer:  Anterior - negative    Posterior - negative  Other  Erythema: absent Scars: absent Sensation: normal Pulse: present Swelling: none Effusion: no effusion present   Left Knee Exam   Muscle Strength  The patient has normal left knee strength.  Tenderness  The patient is experiencing no tenderness.   Range of Motion  Extension: normal  Flexion: normal   Tests  McMurray:  Medial - negative Lateral - negative Varus: negative Valgus: negative Drawer:  Anterior - negative     Posterior - negative  Other  Erythema: absent Scars: absent Sensation: normal Pulse: present Swelling: none      Arther Abbott, MD  2:24 PM 02/11/2018

## 2018-02-11 NOTE — Patient Instructions (Signed)
Continue controlling your weight  Take your Naprosyn  Use the knee brace  What You Need to Know About Prescription Opioid Pain Medicine        Please be advised. You are on a medication which is classified as an "opiod". The CDC the [facility]  has recently advised all providers to advise patient's that these medications have certain risks which include but are not limited to:    drug intolerance  drug addiction  respiratory depression   respiratory failure  Death  Please keep these medications locked away. If you feel that you are becoming addicted to these medicines or you are having difficulties with these medications please alert your provider.   As your provider I will attempt to wean you off of these medications when you're severe acute pain has been taking care of. However, if we cannot wean you off of this medication you will be sent to a pain management center where they can better manage chronic pain   Opioids are powerful medicines that are used to treat moderate to severe pain. Opioids should be taken with the supervision of a trained health care provider. They should be taken for the shortest period of time as possible. This is because opioids can be addictive and the longer you take opioids, the greater your risk of addiction (opioid use disorder). What do opioids do? Opioids help reduce or eliminate pain. When used for short periods of time, they can help you:  Sleep better.  Do better in physical or occupational therapy.  Feel better in the first few days after an injury.  Recover from surgery. What kind of problems can opioids cause? Opioids can cause side effects, such as:  Constipation.  Nausea.  Vomiting.  Drowsiness.  Confusion.  Opioid use disorder.  Breathing difficulties (respiratory depression). Using opioid pain medicines for longer than 3 days increases your risk of these side effects. Taking opioid pain medicine  for a long period of time can affect your ability to do daily tasks. It also puts you at risk for:  Car accidents.  Heart attack.  Overdose, which can sometimes lead to death. What can increase my risk for developing problems while taking opioids? You may be at an especially high risk for problems while taking opioids if you:  Are over the age of 41.  Are pregnant.  Have kidney or liver disease.  Have certain mental health conditions, such as depression or anxiety.  Have a history of substance use disorder.  Have had an opioid overdose in the past. How do I stop taking opioids if I have been taking them for a long time? If you have been taking opioid medicine for more than a few weeks, you may need to slowly stop taking them (taper). Tapering your use of opioids can decrease your chances of experiencing withdrawal symptoms, such as:  Abdominal pain and cramping.  Nausea.  Sweating.  Sleepiness.  Restlessness.  Uncontrollable shaking (tremors).  Cravings for the medicine. Do not attempt to taper your use of opioids on your own. Talk with your health care provider about how to do this. Your health care provider may prescribe a step-down schedule based on how much medicine you are taking and how long you have been taking it. What are the benefits of stopping the use of opioids? By switching from opioid pain medicine to non-opioid pain management options, you will decrease your risk of accidents and injuries associated with long-term opioid use. You will  also be able to:  Monitor your pain more accurately and know when to seek medical care if it is not improving.  Decrease risk to others around you. Having opioids in the home increases the risk for accidental or intentional use or overdose by others. How can I treat pain without opioids? Pain can be managed with many types of alternative treatments. Ask your health care provider to refer you to one or more specialists who can  help you manage pain through:  Physical or occupational therapy.  Counseling (cognitive-behavioral therapy).  Good nutrition.  Biofeedback.  Massage.  Meditation.  Non-opioid medicine.  Following a gentle exercise program. Where can I get support? If you have been taking opioids for a long time, you may benefit from receiving support for quitting from a local support group or counselor. Ask your health care provider for a referral to these resources in your area. When should I seek medical care? Seek medical care right away if you are taking opioids and you experience any of the following:  Difficulty breathing.  Breathing that is more shallow or slower than normal.  A very slow heartbeat (pulse).  Severe confusion.  Unconsciousness.  Sleepiness.  Difficulty waking from sleep.  Slurred speech.  Nausea and vomiting.  Cold, clammy skin.  Blue lips or fingernails.  Limpness.  Abnormally small pupils. If you think that you or someone else may have taken too much of an opioid medicine, get medical help right away. Do not wait to see if the symptoms go away on their own. Call your local emergency services (911 in the [place] or call the hotline of the [facility] 606-671-9276 in the [place].).  Where can I get more information? To learn more about opioid medicines, visit the Centers for Disease Control and Prevention web site Opioid Basics at https://keller-santana.com/. Summary  Opioid medicines can help you manage moderate-to-severe pain for a short period of time.  Taking opioid pain medicine for a long period of time puts you at risk for unintentional accidents, injury, and even death.  If you think that you or someone else may have taken too much of an opioid, get medical help right away. This information is not intended to replace advice given to you by your health care provider. Make sure you discuss any questions you  have with your health care provider. Document Released: 09/22/2015 Document Revised: 04/19/2016 Document Reviewed: 04/07/2015 Elsevier Interactive Patient Education  2017 [employer].

## 2018-05-29 ENCOUNTER — Emergency Department (HOSPITAL_COMMUNITY): Payer: BLUE CROSS/BLUE SHIELD

## 2018-05-29 ENCOUNTER — Encounter (HOSPITAL_COMMUNITY): Payer: Self-pay

## 2018-05-29 ENCOUNTER — Emergency Department (HOSPITAL_COMMUNITY)
Admission: EM | Admit: 2018-05-29 | Discharge: 2018-05-30 | Disposition: A | Discharge status: Home / Self Care | Payer: BLUE CROSS/BLUE SHIELD | Attending: Emergency Medicine | Admitting: Emergency Medicine

## 2018-05-29 ENCOUNTER — Other Ambulatory Visit: Payer: Self-pay

## 2018-05-29 DIAGNOSIS — K219 Gastro-esophageal reflux disease without esophagitis: Secondary | ICD-10-CM | POA: Insufficient documentation

## 2018-05-29 DIAGNOSIS — E876 Hypokalemia: Secondary | ICD-10-CM | POA: Diagnosis not present

## 2018-05-29 DIAGNOSIS — I10 Essential (primary) hypertension: Secondary | ICD-10-CM | POA: Diagnosis not present

## 2018-05-29 DIAGNOSIS — Z79899 Other long term (current) drug therapy: Secondary | ICD-10-CM | POA: Diagnosis not present

## 2018-05-29 DIAGNOSIS — R110 Nausea: Secondary | ICD-10-CM | POA: Diagnosis not present

## 2018-05-29 DIAGNOSIS — R1013 Epigastric pain: Secondary | ICD-10-CM | POA: Diagnosis not present

## 2018-05-29 DIAGNOSIS — Z7982 Long term (current) use of aspirin: Secondary | ICD-10-CM | POA: Diagnosis not present

## 2018-05-29 DIAGNOSIS — T502X5A Adverse effect of carbonic-anhydrase inhibitors, benzothiadiazides and other diuretics, initial encounter: Secondary | ICD-10-CM | POA: Diagnosis not present

## 2018-05-29 NOTE — ED Triage Notes (Signed)
C/o pain up under breast center, that radiates to back x 2wks.

## 2018-05-30 LAB — HEPATIC FUNCTION PANEL
ALBUMIN: 4.3 g/dL (ref 3.5–5.0)
ALK PHOS: 85 U/L (ref 38–126)
ALT: 14 U/L (ref 0–44)
AST: 16 U/L (ref 15–41)
Bilirubin, Direct: <0.1 mg/dL (ref 0.0–0.2)
TOTAL PROTEIN: 7.7 g/dL (ref 6.5–8.1)
Total Bilirubin: 0.6 mg/dL (ref 0.3–1.2)

## 2018-05-30 LAB — DIFFERENTIAL
BASOS ABS: 0 10*3/uL (ref 0.0–0.1)
BASOS PCT: 0 %
Eosinophils Absolute: 0.5 10*3/uL (ref 0.0–0.7)
Eosinophils Relative: 5 %
Lymphocytes Relative: 32 %
Lymphs Abs: 3.5 10*3/uL (ref 0.7–4.0)
MONO ABS: 0.7 10*3/uL (ref 0.1–1.0)
Monocytes Relative: 7 %
NEUTROS ABS: 6.3 10*3/uL (ref 1.7–7.7)
NEUTROS PCT: 56 %

## 2018-05-30 LAB — LIPASE, BLOOD: LIPASE: 30 U/L (ref 11–51)

## 2018-05-30 LAB — URINALYSIS, ROUTINE W REFLEX MICROSCOPIC
BACTERIA UA: NONE SEEN
BILIRUBIN URINE: NEGATIVE
Glucose, UA: NEGATIVE mg/dL
HGB URINE DIPSTICK: NEGATIVE
KETONES UR: NEGATIVE mg/dL
NITRITE: NEGATIVE
PROTEIN: NEGATIVE mg/dL
Specific Gravity, Urine: 1.016 (ref 1.005–1.030)
pH: 5 (ref 5.0–8.0)

## 2018-05-30 LAB — CBC
HCT: 34.7 % — ABNORMAL LOW (ref 36.0–46.0)
Hemoglobin: 10.9 g/dL — ABNORMAL LOW (ref 12.0–15.0)
MCH: 28.2 pg (ref 26.0–34.0)
MCHC: 31.4 g/dL (ref 30.0–36.0)
MCV: 89.9 fL (ref 78.0–100.0)
PLATELETS: 367 10*3/uL (ref 150–400)
RBC: 3.86 MIL/uL — ABNORMAL LOW (ref 3.87–5.11)
RDW: 14.3 % (ref 11.5–15.5)
WBC: 11.4 10*3/uL — ABNORMAL HIGH (ref 4.0–10.5)

## 2018-05-30 LAB — BASIC METABOLIC PANEL
ANION GAP: 8 (ref 5–15)
BUN: 16 mg/dL (ref 6–20)
CO2: 28 mmol/L (ref 22–32)
CREATININE: 0.71 mg/dL (ref 0.44–1.00)
Calcium: 9.2 mg/dL (ref 8.9–10.3)
Chloride: 105 mmol/L (ref 98–111)
GFR calc Af Amer: >60 mL/min (ref >60)
GLUCOSE: 113 mg/dL — AB (ref 70–99)
Potassium: 3.4 mmol/L — ABNORMAL LOW (ref 3.5–5.1)
Sodium: 141 mmol/L (ref 135–145)

## 2018-05-30 LAB — I-STAT TROPONIN, ED
TROPONIN I, POC: 0 ng/mL (ref 0.00–0.08)
Troponin i, poc: 0 ng/mL (ref 0.00–0.08)

## 2018-05-30 LAB — I-STAT BETA HCG BLOOD, ED (MC, WL, AP ONLY): I-stat hCG, quantitative: <5 m[IU]/mL (ref <5)

## 2018-05-30 MED ORDER — POTASSIUM CHLORIDE CRYS ER 20 MEQ PO TBCR
40.0000 meq | EXTENDED_RELEASE_TABLET | Freq: Once | ORAL | Status: AC
  Administered 2018-05-30: 40 meq via ORAL
  Filled 2018-05-30: qty 2

## 2018-05-30 MED ORDER — GI COCKTAIL ~~LOC~~
30.0000 mL | Freq: Once | ORAL | Status: AC
  Administered 2018-05-30: 30 mL via ORAL
  Filled 2018-05-30: qty 30

## 2018-05-30 MED ORDER — PANTOPRAZOLE SODIUM 40 MG PO TBEC
40.0000 mg | DELAYED_RELEASE_TABLET | Freq: Once | ORAL | Status: AC
  Administered 2018-05-30: 40 mg via ORAL
  Filled 2018-05-30: qty 1

## 2018-05-30 MED ORDER — POTASSIUM CHLORIDE CRYS ER 20 MEQ PO TBCR: 20.0000 meq | EXTENDED_RELEASE_TABLET | Freq: Every day | ORAL | 0 refills | Status: DC

## 2018-05-30 MED ORDER — DICLOFENAC SODIUM 1 % TD GEL: 2.0000 g | Freq: Four times a day (QID) | TRANSDERMAL | 0 refills | Status: AC

## 2018-05-30 MED ORDER — PANTOPRAZOLE SODIUM 40 MG PO TBEC: 40.0000 mg | DELAYED_RELEASE_TABLET | Freq: Every day | ORAL | 0 refills | Status: DC

## 2018-05-30 NOTE — ED Provider Notes (Signed)
[facility] EMERGENCY DEPARTMENT Provider Note   CSN: [id number] Arrival date & time: 05/29/18  2313     History   Chief Complaint Chief Complaint  Patient presents with  . Abdominal Pain  . Back Pain    HPI Kendra Valenzuela is a 57 y.o. female.  The history is provided by the patient.  Abdominal Pain    Back Pain   Associated symptoms include abdominal pain.  She has history of hypertension, hyperlipidemia, GERD, arthritis and comes in with epigastric and lower chest pain for the last 2 weeks.  Pain is constant and does radiate to the back.  It is worse when she lays down, better when she sits up.  Pain has awakened her at night.  It is not affected by eating or exertion.  There is no associated dyspnea or diaphoresis.  There has been some mild nausea without vomiting.  She has not done anything to treat the pain.  Pain is has gotten worse over the 2-week period.  She rates pain a 10/10.  She is a non-smoker but will occasionally consume alcohol.  She has history of hypertension and hyperlipidemia but no history of diabetes.  There is no known history of premature coronary atherosclerosis.  Past Medical History:  Diagnosis Date  . Anxiety   . Arthritis   . Back pain   . Chest pain 01/2013   Admitted to [facility] in 01/2013  . Environmental allergies   . GERD (gastroesophageal reflux disease)   . Headache(784.0)   . Hypercholesteremia   . Hypertension 01/13/2013    Patient Active Problem List   Diagnosis Date Noted  . Polyp of transverse colon   . Encounter for screening colonoscopy 05/27/2017  . ETOH abuse 05/27/2017  . Bilateral knee pain 11/01/2015  . Right knee pain 10/18/2015  . Left knee pain 10/18/2015  . Unsteady gait 08/10/2013  . Altered mental state 08/10/2013  . Acute encephalopathy 08/10/2013  . Headache 08/10/2013  . Hypokalemia 08/10/2013  . Anemia 08/10/2013  . Hypercholesterolemia 01/21/2013  . Chronic back pain 01/13/2013  . Hypertension 01/13/2013    . Chest pain 01/07/2013    Past Surgical History:  Procedure Laterality Date  . CARDIAC CATHETERIZATION    . CHOLECYSTECTOMY N/A 01/21/2014   Procedure: LAPAROSCOPIC CHOLECYSTECTOMY;  Surgeon: Jamesetta So, MD;  Location: [facility] ORS;  Service: General;  Laterality: N/A;  . COLONOSCOPY WITH PROPOFOL N/A 06/17/2017   Procedure: COLONOSCOPY WITH PROPOFOL;  Surgeon: Danie Binder, MD;  Location: [facility] ENDO SUITE;  Service: Endoscopy;  Laterality: N/A;  12:45pm  . DILATION AND CURETTAGE OF UTERUS    . LEFT HEART CATHETERIZATION WITH CORONARY ANGIOGRAM N/A 02/08/2013   Procedure: LEFT HEART CATHETERIZATION WITH CORONARY ANGIOGRAM;  Surgeon: Burnell Blanks, MD;  Location: [facility] CATH LAB;  Service: Cardiovascular;  Laterality: N/A;  . POLYPECTOMY  06/17/2017   Procedure: POLYPECTOMY;  Surgeon: Danie Binder, MD;  Location: [facility] ENDO SUITE;  Service: Endoscopy;;  colon   . UNILATERAL SALPINGECTOMY Right    due to ectopic pregnancy     OB History    Gravida      Para      Term      Preterm      AB      Living  0     SAB      TAB      Ectopic      Multiple      Live Births  Home Medications    Prior to Admission medications   Medication Sig Start Date End Date Taking? Authorizing Provider  aspirin EC 81 MG tablet Take 81 mg by mouth daily.    [provider]  cholecalciferol (VITAMIN D) 1000 units tablet Take 1,000 Units by mouth daily.    [provider]  diphenhydrAMINE (BENADRYL) 25 MG tablet Take 50 mg by mouth 2 (two) times daily as needed for allergies.    [provider]  HYDROcodone-acetaminophen (NORCO/VICODIN) 5-325 MG tablet Take 1 tablet by mouth every 6 (six) hours as needed for moderate pain (Must last 30 days.Do not take and drive a car or use machinery.). Patient not taking: Reported on 02/11/2018 08/12/17   Sanjuana Kava, MD  lisinopril (PRINIVIL,ZESTRIL) 20 MG tablet Take 20 mg by mouth daily.    [provider]  lisinopril-hydrochlorothiazide (PRINZIDE,ZESTORETIC) 20-12.5 MG tablet  01/29/18   [provider]  LORazepam (ATIVAN) 1 MG tablet Take 1 mg by mouth every 8 (eight) hours.     [provider]  naproxen (NAPROSYN) 500 MG tablet Take 500 mg by mouth 2 (two) times daily with a meal.    [provider]  oxyCODONE (OXY IR/ROXICODONE) 5 MG immediate release tablet  01/26/18   [provider]  rosuvastatin (CRESTOR) 10 MG tablet Take 10 mg by mouth daily.    [provider]    Family History Family History  Problem Relation Age of Onset  . Hypertension Father   . Colon cancer Neg Hx   . Colon polyps Neg Hx     Social History Social History   Tobacco Use  . Smoking status: Never Smoker  . Smokeless tobacco: Never Used  Substance Use Topics  . Alcohol use: Yes    Alcohol/week: 14.0 standard drinks    Types: 14 Cans of beer per week    Comment: 2 can 24 ounce beers daily  . Drug use: No     Allergies   Patient has no known allergies.   Review of Systems Review of Systems  Gastrointestinal: Positive for abdominal pain.  Musculoskeletal: Positive for back pain.  All other systems reviewed and are negative.    Physical Exam Updated Vital Signs BP 139/73 (BP Location: Right Arm)   Pulse 83   Temp 97.9 F (36.6 C) (Oral)   Resp 18   Ht 5\' 6"  (1.676 m)   Wt 83 kg   SpO2 100%   BMI 29.54 kg/m   Physical Exam  Nursing note and vitals reviewed.  57 year old female, resting comfortably and in no acute distress. Vital signs are normal. Oxygen saturation is 100%, which is normal. Head is normocephalic and atraumatic. PERRLA, EOMI. Oropharynx is clear. Neck is nontender and supple without adenopathy or JVD. Back is nontender and there is no CVA tenderness. Lungs are clear without rales, wheezes, or rhonchi. Chest is nontender. Heart has regular rate and rhythm without murmur. Abdomen is soft, flat, with mild to  moderate epigastric tenderness.  There is no rebound or guarding.  There are no masses or hepatosplenomegaly and peristalsis is normoactive. Extremities have no cyanosis or edema, full range of motion is present. Skin is warm and dry without rash. Neurologic: Mental status is normal, cranial nerves are intact, there are no motor or sensory deficits.  ED Treatments / Results  Labs (all labs ordered are listed, but only abnormal results are displayed) Labs Reviewed  BASIC METABOLIC PANEL - Abnormal; Notable for the following  components:      Result Value   Potassium 3.4 (*)    Glucose, Bld 113 (*)    All other components within normal limits  CBC - Abnormal; Notable for the following components:   WBC 11.4 (*)    RBC 3.86 (*)    Hemoglobin 10.9 (*)    HCT 34.7 (*)    All other components within normal limits  URINALYSIS, ROUTINE W REFLEX MICROSCOPIC - Abnormal; Notable for the following components:   Leukocytes, UA SMALL (*)    All other components within normal limits  HEPATIC FUNCTION PANEL  LIPASE, BLOOD  DIFFERENTIAL  I-STAT TROPONIN, ED  I-STAT BETA HCG BLOOD, ED ([facility], [facility], [facility] ONLY)  I-STAT TROPONIN, ED    EKG EKG Interpretation  Date/Time:  Saturday May 30 2018 00:00:47 EDT Ventricular Rate:  71 PR Interval:    QRS Duration: 119 QT Interval:  394 QTC Calculation: 429 R Axis:   27 Text Interpretation:  Sinus rhythm Nonspecific intraventricular conduction delay Low voltage, precordial leads When compared with ECG of 06/13/2017, No significant change was found Confirmed by Delora Fuel ([id number]) on 05/30/2018 12:08:23 AM   Radiology Dg Chest 2 View  Result Date: 05/30/2018 CLINICAL DATA:  57 y/o  F; chest pain. EXAM: CHEST - 2 VIEW COMPARISON:  12/07/2015 chest radiograph FINDINGS: Stable heart size and mediastinal contours are within normal limits. Both lungs are clear. The visualized skeletal structures are unremarkable. Right upper quadrant cholecystectomy clips.  IMPRESSION: No acute pulmonary process identified. Electronically Signed   By: Kristine Garbe M.D.   On: 05/30/2018 00:43    Procedures Procedures  Medications Ordered in ED Medications - No data to display   Initial Impression / Assessment and Plan / ED Course  I have reviewed the triage vital signs and the nursing notes.  Pertinent labs & imaging results that were available during my care of the patient were reviewed by me and considered in my medical decision making (see chart for details).  Epigastric pain concerning for exacerbation of GERD, possible peptic ulcer disease.  Old records are reviewed, and she had cardiac catheterization done in 2014 showing no atherosclerotic disease at all.  In spite of 5 years since catheterization, it is very unlikely that she has developed significant coronary atherosclerosis in the interim.  Heart score is 2, which puts her at low risk for major adverse cardiac events in the next 6 weeks.  ECG shows no acute changes.  Chest x-ray is unremarkable.  Initial troponin is normal.  Will check hepatic function and lipase, give therapeutic trial of GI cocktail.  2:11 AM Hepatic function panel and lipase are normal.  She had good relief of pain with GI cocktail and is given a dose of pantoprazole.  At this point, repeat troponin is pending, will discharge if normal.  3:51 AM Repeat troponin is undetectable.  She continues to be pain-free.  She is instructed to discontinue naproxen, given prescription for pantoprazole and diclofenac gel.  Also given prescription for K-Dur because of the diuretic induced hypokalemia.  Follow-up with PCP in 1 week.  Final Clinical Impressions(s) / ED Diagnoses   Final diagnoses:  Epigastric pain  Diuretic-induced hypokalemia  Gastroesophageal reflux disease, esophagitis presence not specified    ED Discharge Orders         Ordered    pantoprazole (PROTONIX) 40 MG tablet  Daily     05/30/18 0348    potassium  chloride SA (K-DUR,KLOR-CON) 20 MEQ tablet  Daily  05/30/18 0348    diclofenac sodium (VOLTAREN) 1 % GEL  4 times daily     05/30/18 4171           Delora Fuel, MD 27/87/18 [phone]

## 2018-05-30 NOTE — Discharge Instructions (Addendum)
Stop taking naproxen - it can irritate your stomach, and can even cause ulcers. I have prescribed diclofenac gel to replace the naproxen - you can apply it to your knee 3-4 times a day, as needed. Once your stomach is doing better, you can talk with your doctor about possibly taking celecoxib (Celebrex) - it is similar to naproxen, but not as harsh on your stomach.

## 2018-07-25 ENCOUNTER — Emergency Department (HOSPITAL_COMMUNITY)
Admission: EM | Admit: 2018-07-25 | Discharge: 2018-07-26 | Disposition: A | Discharge status: Home / Self Care | Payer: BLUE CROSS/BLUE SHIELD | Attending: Emergency Medicine | Admitting: Emergency Medicine

## 2018-07-25 ENCOUNTER — Encounter (HOSPITAL_COMMUNITY): Payer: Self-pay

## 2018-07-25 ENCOUNTER — Encounter (HOSPITAL_COMMUNITY): Payer: Self-pay | Admitting: Emergency Medicine

## 2018-07-25 ENCOUNTER — Other Ambulatory Visit: Payer: Self-pay

## 2018-07-25 ENCOUNTER — Emergency Department (HOSPITAL_COMMUNITY): Payer: BLUE CROSS/BLUE SHIELD

## 2018-07-25 ENCOUNTER — Emergency Department (HOSPITAL_COMMUNITY)
Admission: EM | Admit: 2018-07-25 | Discharge: 2018-07-25 | Disposition: A | Discharge status: Home / Self Care | Payer: BLUE CROSS/BLUE SHIELD | Attending: Emergency Medicine | Admitting: Emergency Medicine

## 2018-07-25 DIAGNOSIS — S39012A Strain of muscle, fascia and tendon of lower back, initial encounter: Secondary | ICD-10-CM | POA: Diagnosis not present

## 2018-07-25 DIAGNOSIS — Y999 Unspecified external cause status: Secondary | ICD-10-CM | POA: Insufficient documentation

## 2018-07-25 DIAGNOSIS — Y9389 Activity, other specified: Secondary | ICD-10-CM | POA: Insufficient documentation

## 2018-07-25 DIAGNOSIS — Z79899 Other long term (current) drug therapy: Secondary | ICD-10-CM | POA: Diagnosis not present

## 2018-07-25 DIAGNOSIS — Z7982 Long term (current) use of aspirin: Secondary | ICD-10-CM | POA: Insufficient documentation

## 2018-07-25 DIAGNOSIS — Y92410 Unspecified street and highway as the place of occurrence of the external cause: Secondary | ICD-10-CM | POA: Diagnosis not present

## 2018-07-25 DIAGNOSIS — I10 Essential (primary) hypertension: Secondary | ICD-10-CM | POA: Insufficient documentation

## 2018-07-25 DIAGNOSIS — S161XXA Strain of muscle, fascia and tendon at neck level, initial encounter: Secondary | ICD-10-CM | POA: Insufficient documentation

## 2018-07-25 DIAGNOSIS — W1809XA Striking against other object with subsequent fall, initial encounter: Secondary | ICD-10-CM | POA: Insufficient documentation

## 2018-07-25 DIAGNOSIS — Y92009 Unspecified place in unspecified non-institutional (private) residence as the place of occurrence of the external cause: Secondary | ICD-10-CM | POA: Insufficient documentation

## 2018-07-25 DIAGNOSIS — S0003XA Contusion of scalp, initial encounter: Secondary | ICD-10-CM | POA: Diagnosis not present

## 2018-07-25 DIAGNOSIS — S199XXA Unspecified injury of neck, initial encounter: Secondary | ICD-10-CM | POA: Diagnosis present

## 2018-07-25 DIAGNOSIS — R55 Syncope and collapse: Secondary | ICD-10-CM | POA: Insufficient documentation

## 2018-07-25 MED ORDER — SODIUM CHLORIDE 0.9 % IV SOLN
1000.0000 mL | INTRAVENOUS | Status: DC
  Administered 2018-07-25: 1000 mL via INTRAVENOUS

## 2018-07-25 MED ORDER — ONDANSETRON HCL 4 MG/2ML IJ SOLN
4.0000 mg | Freq: Once | INTRAMUSCULAR | Status: AC
  Administered 2018-07-25: 4 mg via INTRAVENOUS
  Filled 2018-07-25: qty 2

## 2018-07-25 MED ORDER — DIAZEPAM 5 MG PO TABS
5.0000 mg | ORAL_TABLET | Freq: Once | ORAL | Status: AC
  Administered 2018-07-25: 5 mg via ORAL
  Filled 2018-07-25: qty 1

## 2018-07-25 MED ORDER — SODIUM CHLORIDE 0.9 % IV BOLUS (SEPSIS)
500.0000 mL | Freq: Once | INTRAVENOUS | Status: AC
  Administered 2018-07-25: 1000 mL via INTRAVENOUS

## 2018-07-25 MED ORDER — CYCLOBENZAPRINE HCL 5 MG PO TABS: 5.0000 mg | ORAL_TABLET | Freq: Three times a day (TID) | ORAL | 0 refills | Status: DC | PRN

## 2018-07-25 MED ORDER — HYDROCODONE-ACETAMINOPHEN 5-325 MG PO TABS
2.0000 | ORAL_TABLET | Freq: Once | ORAL | Status: AC
  Administered 2018-07-25: 2 via ORAL
  Filled 2018-07-25: qty 2
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
## 2018-07-25 NOTE — ED Triage Notes (Addendum)
Pt had a syncopal episode after dinner. Witnessed by family. Pt said she hit back of head when she fell against bathroom door.    Per EMS--- 141fsbs 80/54 SR  150 ml NS given 18 L AC
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
## 2018-07-25 NOTE — ED Notes (Signed)
Pt requesting something for pain, [place] PA notified,
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
## 2018-07-25 NOTE — ED Notes (Signed)
Placed c-collar on patient in triage.
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
## 2018-07-26 LAB — CBC WITH DIFFERENTIAL/PLATELET
Abs Immature Granulocytes: 0.04 10*3/uL (ref 0.00–0.07)
BASOS ABS: 0.1 10*3/uL (ref 0.0–0.1)
Basophils Relative: 1 %
EOS ABS: 0.2 10*3/uL (ref 0.0–0.5)
Eosinophils Relative: 3 %
HEMATOCRIT: 33.4 % — AB (ref 36.0–46.0)
Hemoglobin: 10 g/dL — ABNORMAL LOW (ref 12.0–15.0)
IMMATURE GRANULOCYTES: 0 %
LYMPHS ABS: 1.7 10*3/uL (ref 0.7–4.0)
Lymphocytes Relative: 19 %
MCH: 26.7 pg (ref 26.0–34.0)
MCHC: 29.9 g/dL — ABNORMAL LOW (ref 30.0–36.0)
MCV: 89.3 fL (ref 80.0–100.0)
Monocytes Absolute: 0.7 10*3/uL (ref 0.1–1.0)
Monocytes Relative: 8 %
NEUTROS PCT: 69 %
NRBC: 0 % (ref 0.0–0.2)
Neutro Abs: 6.4 10*3/uL (ref 1.7–7.7)
PLATELETS: 290 10*3/uL (ref 150–400)
RBC: 3.74 MIL/uL — ABNORMAL LOW (ref 3.87–5.11)
RDW: 13.7 % (ref 11.5–15.5)
WBC: 9.2 10*3/uL (ref 4.0–10.5)

## 2018-07-26 LAB — BASIC METABOLIC PANEL
ANION GAP: 10 (ref 5–15)
BUN: 18 mg/dL (ref 6–20)
CALCIUM: 8.3 mg/dL — AB (ref 8.9–10.3)
CHLORIDE: 105 mmol/L (ref 98–111)
CO2: 25 mmol/L (ref 22–32)
Creatinine, Ser: 0.8 mg/dL (ref 0.44–1.00)
GFR calc non Af Amer: >60 mL/min (ref >60)
Glucose, Bld: 125 mg/dL — ABNORMAL HIGH (ref 70–99)
POTASSIUM: 4 mmol/L (ref 3.5–5.1)
SODIUM: 140 mmol/L (ref 135–145)

## 2018-07-26 MED ORDER — HYDROCODONE-ACETAMINOPHEN 5-325 MG PO TABS: ORAL_TABLET | ORAL | 0 refills | Status: DC

## 2018-07-26 NOTE — Discharge Instructions (Addendum)
The CT scan of your head is negative.  Your CT scan of your neck is also negative for fracture or dislocation.  Your hemoglobin and hematocrit are slightly low, but within the range that you have been noted to have recently.  Please increase foods high in iron.  Your chemistries are within normal limits.  Please continue to use your muscle relaxer.  Use Norco for pain not improved by extra strength Tylenol.  Please see Dr. Cindie Laroche or return to the emergency department if any changes in your condition, problems, or concerns.

## 2018-08-17 ENCOUNTER — Ambulatory Visit: Payer: BLUE CROSS/BLUE SHIELD | Admitting: Orthopedic Surgery

## 2018-08-17 ENCOUNTER — Encounter: Payer: Self-pay | Admitting: Orthopedic Surgery

## 2019-10-04 ENCOUNTER — Encounter (HOSPITAL_COMMUNITY): Payer: Self-pay | Admitting: *Deleted

## 2019-10-04 ENCOUNTER — Emergency Department (HOSPITAL_COMMUNITY): Payer: Self-pay

## 2019-10-04 ENCOUNTER — Emergency Department (HOSPITAL_COMMUNITY)
Admission: EM | Admit: 2019-10-04 | Discharge: 2019-10-04 | Disposition: A | Discharge status: Home / Self Care | Payer: Self-pay | Attending: Emergency Medicine | Admitting: Emergency Medicine

## 2019-10-04 ENCOUNTER — Other Ambulatory Visit: Payer: Self-pay

## 2019-10-04 DIAGNOSIS — I10 Essential (primary) hypertension: Secondary | ICD-10-CM | POA: Insufficient documentation

## 2019-10-04 DIAGNOSIS — R072 Precordial pain: Secondary | ICD-10-CM | POA: Insufficient documentation

## 2019-10-04 LAB — CBC
HCT: 37 % (ref 36.0–46.0)
Hemoglobin: 11.6 g/dL — ABNORMAL LOW (ref 12.0–15.0)
MCH: 27.7 pg (ref 26.0–34.0)
MCHC: 31.4 g/dL (ref 30.0–36.0)
MCV: 88.3 fL (ref 80.0–100.0)
Platelets: 330 10*3/uL (ref 150–400)
RBC: 4.19 MIL/uL (ref 3.87–5.11)
RDW: 14 % (ref 11.5–15.5)
WBC: 10.9 10*3/uL — ABNORMAL HIGH (ref 4.0–10.5)
nRBC: 0 % (ref 0.0–0.2)

## 2019-10-04 LAB — BASIC METABOLIC PANEL
Anion gap: 11 (ref 5–15)
BUN: 28 mg/dL — ABNORMAL HIGH (ref 6–20)
CO2: 24 mmol/L (ref 22–32)
Calcium: 8.9 mg/dL (ref 8.9–10.3)
Chloride: 101 mmol/L (ref 98–111)
Creatinine, Ser: 0.6 mg/dL (ref 0.44–1.00)
GFR calc Af Amer: >60 mL/min (ref >60)
GFR calc non Af Amer: >60 mL/min (ref >60)
Glucose, Bld: 80 mg/dL (ref 70–99)
Potassium: 3.5 mmol/L (ref 3.5–5.1)
Sodium: 136 mmol/L (ref 135–145)

## 2019-10-04 LAB — POC URINE PREG, ED: Preg Test, Ur: NEGATIVE

## 2019-10-04 LAB — TROPONIN I (HIGH SENSITIVITY)
Troponin I (High Sensitivity): <2 ng/L (ref <18)
Troponin I (High Sensitivity): <2 ng/L (ref <18)

## 2019-10-04 MED ORDER — SODIUM CHLORIDE 0.9% FLUSH
3.0000 mL | Freq: Once | INTRAVENOUS | Status: AC
  Administered 2019-10-04: 14:00:00 3 mL via INTRAVENOUS

## 2019-10-04 NOTE — ED Notes (Signed)
Pt reported would like to have spouse regularly updated. Contacted pt spouse, verbalized understanding of treatment plan.

## 2019-10-04 NOTE — ED Provider Notes (Signed)
Emergency Department Provider Note   I have reviewed the triage vital signs and the nursing notes.   HISTORY  Chief Complaint Chest Pain   HPI Kendra Valenzuela is a 59 y.o. female with past medical history of hypertension and HLD presents to the ED with for evaluation of chest pressure which began today.  Patient states that she was out at the funeral home when she developed pressure in the center of her chest.  She denies diaphoresis, shortness of breath, cough.  Pain was moderate and resolved after approximately 20 minutes.  She tells me she had a similar pain approximately 1 month ago and that this is happened in the past occasionally. No active pain at this time. No URI symptoms or fever.     Past Medical History:  Diagnosis Date  . Anxiety   . Arthritis   . Back pain   . Chest pain 01/2013   Admitted to [facility] in 01/2013  . Environmental allergies   . GERD (gastroesophageal reflux disease)   . Headache(784.0)   . Hypercholesteremia   . Hypertension 01/13/2013    Patient Active Problem List   Diagnosis Date Noted  . Polyp of transverse colon   . Encounter for screening colonoscopy 05/27/2017  . ETOH abuse 05/27/2017  . Bilateral knee pain 11/01/2015  . Right knee pain 10/18/2015  . Left knee pain 10/18/2015  . Unsteady gait 08/10/2013  . Altered mental state 08/10/2013  . Acute encephalopathy 08/10/2013  . Headache 08/10/2013  . Hypokalemia 08/10/2013  . Anemia 08/10/2013  . Hypercholesterolemia 01/21/2013  . Chronic back pain 01/13/2013  . Hypertension 01/13/2013  . Chest pain 01/07/2013    Past Surgical History:  Procedure Laterality Date  . CARDIAC CATHETERIZATION    . CHOLECYSTECTOMY N/A 01/21/2014   Procedure: LAPAROSCOPIC CHOLECYSTECTOMY;  Surgeon: Jamesetta So, MD;  Location: [facility];  Service: General;  Laterality: N/A;  . COLONOSCOPY WITH PROPOFOL N/A 06/17/2017   Procedure: COLONOSCOPY WITH PROPOFOL;  Surgeon: Danie Binder, MD;  Location: [facility];  Service: Endoscopy;  Laterality: N/A;  12:45pm  . DILATION AND CURETTAGE OF UTERUS    . LEFT HEART CATHETERIZATION WITH CORONARY ANGIOGRAM N/A 02/08/2013   Procedure: LEFT HEART CATHETERIZATION WITH CORONARY ANGIOGRAM;  Surgeon: Burnell Blanks, MD;  Location: [facility] CATH LAB;  Service: Cardiovascular;  Laterality: N/A;  . POLYPECTOMY  06/17/2017   Procedure: POLYPECTOMY;  Surgeon: Danie Binder, MD;  Location: [facility];  Service: Endoscopy;;  colon   . UNILATERAL SALPINGECTOMY Right    due to ectopic pregnancy    Allergies Patient has no known allergies.  Family History  Problem Relation Age of Onset  . Hypertension Father   . Colon cancer Neg Hx   . Colon polyps Neg Hx     Social History Social History   Tobacco Use  . Smoking status: Never Smoker  . Smokeless tobacco: Never Used  Substance Use Topics  . Alcohol use: Not Currently    Alcohol/week: 14.0 standard drinks    Types: 14 Cans of beer per week    Comment: 2 can 24 ounce beers daily  . Drug use: No    Review of Systems  Constitutional: No fever/chills Eyes: No visual changes. ENT: No sore throat. Cardiovascular: Positive chest pain. Respiratory: Denies shortness of breath. Gastrointestinal: No abdominal pain.  No nausea, no vomiting.  No diarrhea.  No constipation. Genitourinary: Negative for dysuria. Musculoskeletal: Negative for back pain. Skin: Negative for rash. Neurological:  Negative for headaches, focal weakness or numbness.  10-point ROS otherwise negative.  ____________________________________________   PHYSICAL EXAM:  VITAL SIGNS: ED Triage Vitals  Enc Vitals Group     BP 10/04/19 1228 (!) 167/90     Pulse Rate 10/04/19 1228 100     Resp 10/04/19 1228 16     Temp 10/04/19 1228 98.5 F (36.9 C)     Temp Source 10/04/19 1228 Oral     SpO2 10/04/19 1228 98 %     Weight 10/04/19 1230 178 lb (80.7 kg)     Height 10/04/19 1230 5\' 6"  (1.676 m)   Constitutional: Alert and  oriented. Well appearing and in no acute distress. Eyes: Conjunctivae are normal. Head: Atraumatic. Nose: No congestion/rhinnorhea. Mouth/Throat: Mucous membranes are moist.  Neck: No stridor.   Cardiovascular: Normal rate, regular rhythm. Good peripheral circulation. Grossly normal heart sounds.   Respiratory: Normal respiratory effort.  No retractions. Lungs CTAB. Gastrointestinal: No distention.  Musculoskeletal: No gross deformities of extremities. Neurologic:  Normal speech and language.  Skin:  Skin is warm, dry and intact. No rash noted.   ____________________________________________   LABS (all labs ordered are listed, but only abnormal results are displayed)  Labs Reviewed  BASIC METABOLIC PANEL - Abnormal; Notable for the following components:      Result Value   BUN 28 (*)    All other components within normal limits  CBC - Abnormal; Notable for the following components:   WBC 10.9 (*)    Hemoglobin 11.6 (*)    All other components within normal limits  POC URINE PREG, ED  TROPONIN I (HIGH SENSITIVITY)  TROPONIN I (HIGH SENSITIVITY)   ____________________________________________  EKG  Rate: 101 PR: 140 QTc: 453  Sinus rhythm. Normal axis. Narrow QRS. No ST elevation or depression. No STEMI.      ____________________________________________  RADIOLOGY  DG Chest 2 View  Result Date: 10/04/2019 CLINICAL DATA:  Central chest pain today. History of hypertension. EXAM: CHEST - 2 VIEW COMPARISON:  Radiographs 05/30/2018. FINDINGS: The heart size and mediastinal contours are stable with aortic atherosclerosis. Linear densities project over the medial lung apices bilaterally on the frontal examination presumably overlying the patient. Additional metal overlies the lower cervical spine. Allowing for this, the lungs appear clear. There is no pleural effusion or pneumothorax. Degenerative changes are present within the thoracic spine. IMPRESSION: No acute  cardiopulmonary process. Presumed foreign bodies overlying the upper chest which could be confirmed with a repeat view after removing overlying clothing/jewelry, as clinically warranted. Electronically Signed   By: Richardean Sale M.D.   On: 10/04/2019 12:52    ____________________________________________   PROCEDURES  Procedure(s) performed:   Procedures  None  ____________________________________________   INITIAL IMPRESSION / ASSESSMENT AND PLAN / ED COURSE  Pertinent labs & imaging results that were available during my care of the patient were reviewed by me and considered in my medical decision making (see chart for details).   Patient presents to the emergency department with central chest pressure for 20 minutes today.  Symptoms have resolved by the time she arrived to the emergency department.  No diaphoresis.  She is well-appearing with no tachypnea.  Doubt infectious etiology.  Very low suspicion for PE clinically.  EKG shows no acute ischemic change.  Initial troponin is less than two.  Waiting on repeat troponin.  If negative plan for discharge.  Lab work and initial troponin reviewed with initial troponin less than 2.  Care transferred to Dr. Rogene Houston  pending second troponin and likely discharge if not up trending.   ____________________________________________  FINAL CLINICAL IMPRESSION(S) / ED DIAGNOSES  Final diagnoses:  Precordial pain    MEDICATIONS GIVEN DURING THIS VISIT:  Medications  sodium chloride flush (NS) 0.9 % injection 3 mL (3 mLs Intravenous Given 10/04/19 1336)    Note:  This document was prepared using Dragon voice recognition software and may include unintentional dictation errors.  Nanda Quinton, MD, [facility] Emergency Medicine    Aviah Sorci, [facility], MD 10/05/19 [phone]

## 2019-10-04 NOTE — Discharge Instructions (Signed)
Work-up for the chest pain today without any acute findings.  Do recommend that you follow-up with your regular doctor and cardiology.  Cardiology may want to do a stress test.  Return for any new or worse chest pain.

## 2019-10-04 NOTE — ED Triage Notes (Signed)
C/o chest pain, states she was sitting at the funeral home and started to have pain

## 2019-10-18 ENCOUNTER — Ambulatory Visit (INDEPENDENT_AMBULATORY_CARE_PROVIDER_SITE_OTHER): Payer: 59 | Admitting: Internal Medicine

## 2019-10-18 ENCOUNTER — Encounter: Payer: Self-pay | Admitting: Internal Medicine

## 2019-10-18 ENCOUNTER — Other Ambulatory Visit: Payer: Self-pay

## 2019-10-18 VITALS — BP 147/94 | HR 104 | Temp 98.0°F | Ht 66.0 in | Wt 174.0 lb

## 2019-10-18 DIAGNOSIS — R079 Chest pain, unspecified: Secondary | ICD-10-CM

## 2019-10-18 DIAGNOSIS — I10 Essential (primary) hypertension: Secondary | ICD-10-CM

## 2019-10-18 MED ORDER — AMLODIPINE BESYLATE 2.5 MG PO TABS: 2.5000 mg | ORAL_TABLET | Freq: Every day | ORAL | 11 refills | Status: DC

## 2019-10-18 NOTE — Progress Notes (Signed)
Cardiology Office Note   Date:  10/18/2019   ID:  Kendra Valenzuela, DOB 1961-06-15, MRN [id number]  PCP:  Lucia Gaskins, MD  Cardiologist:   Dorris Carnes, MD   Pt referred from emergency room for evaluation of CP     History of Present Illness: Kendra Valenzuela is a 59 y.o. female with a history of HTN and HL  She had CP in the past though she cannot remember what she felt  A stress echo was done that showed inferior hypokinesis.  She went on in 2014 to have a cardiac catheterizatoin.   This was normal   RCA was nondominant  LVEF normal 55 to 60%    She was recently seen in ED for CP   Pt at funeral when she  developed chest pressure   Lasted for about 20 min   Had one prior spell     The patient says she has been having episodes of chest pressure on and off occur about 4 times per month.  Not associated with activity.  They can happen at rest.  Sometimes she wakes up with them  Denies any bitter taste in her mouth.  No shortness of breath with the spells.  They last about 5 minutes and then are gone.  The patient notes no difficulty in swallowing.  Appetite is okay.  Says she snacks a lot.   Current Meds  Medication Sig  . acetaminophen (TYLENOL) 325 MG tablet Take 650 mg by mouth every 6 (six) hours as needed.  Marland Kitchen aspirin EC 81 MG tablet Take 81 mg by mouth daily.  . cholecalciferol (VITAMIN D) 1000 units tablet Take 1,000 Units by mouth daily.  . diclofenac sodium (VOLTAREN) 1 % GEL Apply 2 g topically 4 (four) times daily.  . diphenhydrAMINE (BENADRYL) 25 MG tablet Take 50 mg by mouth 2 (two) times daily as needed for allergies.  Marland Kitchen HYDROcodone-acetaminophen (NORCO) 7.5-325 MG tablet Take 1 tablet by mouth 3 (three) times daily.  Marland Kitchen HYDROcodone-acetaminophen (NORCO/VICODIN) 5-325 MG tablet 1 or 2 po q6h prn pain  . lisinopril-hydrochlorothiazide (PRINZIDE,ZESTORETIC) 20-12.5 MG tablet Take 1 tablet by mouth daily.   . naproxen (NAPROSYN) 500 MG tablet Take 1 tablet by mouth 2  (two) times daily.  . pantoprazole (PROTONIX) 40 MG tablet Take 1 tablet (40 mg total) by mouth daily.  . potassium chloride SA (K-DUR,KLOR-CON) 20 MEQ tablet Take 1 tablet (20 mEq total) by mouth daily.  . rosuvastatin (CRESTOR) 10 MG tablet Take 10 mg by mouth daily.     Allergies:   Patient has no known allergies.   Past Medical History:  Diagnosis Date  . Anxiety   . Arthritis   . Back pain   . Chest pain 01/2013   Admitted to [facility] in 01/2013  . Environmental allergies   . GERD (gastroesophageal reflux disease)   . Headache(784.0)   . Hypercholesteremia   . Hypertension 01/13/2013    Past Surgical History:  Procedure Laterality Date  . CARDIAC CATHETERIZATION    . CHOLECYSTECTOMY N/A 01/21/2014   Procedure: LAPAROSCOPIC CHOLECYSTECTOMY;  Surgeon: Jamesetta So, MD;  Location: [facility];  Service: General;  Laterality: N/A;  . COLONOSCOPY WITH PROPOFOL N/A 06/17/2017   Procedure: COLONOSCOPY WITH PROPOFOL;  Surgeon: Danie Binder, MD;  Location[facility];  Service: Endoscopy;  Laterality: N/A;  12:45pm  . DILATION AND CURETTAGE OF UTERUS    . LEFT HEART CATHETERIZATION WITH CORONARY ANGIOGRAM N/A 02/08/2013   Procedure: LEFT HEART  CATHETERIZATION WITH CORONARY ANGIOGRAM;  Surgeon: Burnell Blanks, MD;  Location: [facility] CATH LAB;  Service: Cardiovascular;  Laterality: N/A;  . POLYPECTOMY  06/17/2017   Procedure: POLYPECTOMY;  Surgeon: Danie Binder, MD;  Location[facility];  Service: Endoscopy;;  colon   . UNILATERAL SALPINGECTOMY Right    due to ectopic pregnancy     Social History:  The patient  reports that she has never smoked. She has never used smokeless tobacco. She reports previous alcohol use of about 14.0 standard drinks of alcohol per week. She reports that she does not use drugs.   Family History:  The patient's family history includes Hypertension in her father.    ROS:  Please see the history of present illness. All other systems are reviewed and   Negative to the above problem except as noted.    PHYSICAL EXAM: VS:  BP (!) 147/94   Pulse (!) 104   Temp 98 F (36.7 C) (Temporal)   Ht 5\' 6"  (1.676 m)   SpO2 99%   BMI 28.73 kg/m   GEN: Well nourished, well developed, in no acute distress  HEENT: normal  Neck: no JVD, carotid bruits Cardiac: RRR; no murmurs, rubs, or gallops,no lower extremity edema  Respiratory:  clear to auscultation bilaterally, normal work of breathing GI: soft, nontender, nondistended, + BS  No hepatomegaly  MS: no deformity Moving all extremities   Skin: warm and dry, no rash Neuro:  Strength and sensation are intact Psych: euthymic mood, full affect   EKG:  EKG is not ordered today.  On Oct 04, 2019  ST 101 bpm   incomp RBBB   Lipid Panel    Component Value Date/Time   CHOL 213 (H) 01/13/2013 1333   TRIG 221 (H) 01/13/2013 1333   HDL 65 01/13/2013 1333   CHOLHDL 3.3 01/13/2013 1333   VLDL 44 (H) 01/13/2013 1333   LDLCALC 104 (H) 01/13/2013 1333      Wt Readings from Last 3 Encounters:  10/04/19 178 lb (80.7 kg)  07/25/18 182 lb (82.6 kg)  05/29/18 183 lb (83 kg)      ASSESSMENT AND PLAN:  1.  Chest pressure very atypical for cardiac ischemia.  Not associated with activity.  Question related to blood pressure her pressure is up a little bit here in clinic question of GI.  She is on Protonix but she is also on Naprosyn (has been for years).  I would recommend adding amlodipine 2.5 to her regimen.  Follow blood pressure like to see how she responds symptom wise.    2 hypertension as above  Wil add amlodipine.  Keep on lisinopril/HCTZ at current dose.  3 hyperlipidemia currently on Crestor.  Need to get lipids from Dr. Cindie Laroche.    Current medicines are reviewed at length with the patient today.  The patient does not have concerns regarding medicines.  Signed, Dorris Carnes, MD  10/18/2019 3:14 PM    [address], [place], [place]  [address] Phone:  [phone]; Fax: [phone]

## 2019-10-18 NOTE — Patient Instructions (Signed)
Medication Instructions:  Your physician has recommended you make the following change in your medication:  Norvasc 2.5 mg daily   *If you need a refill on your cardiac medications before your next appointment, please call your pharmacy*  Lab Work: NONE  If you have labs (blood work) drawn today and your tests are completely normal, you will receive your results only by: Marland Kitchen MyChart Message (if you have MyChart) OR . A paper copy in the mail If you have any lab test that is abnormal or we need to change your treatment, we will call you to review the results.  Testing/Procedures: NONE   Follow-Up: At [facility], you and your health needs are our priority.  As part of our continuing mission to provide you with exceptional heart care, we have created designated Provider Care Teams.  These Care Teams include your primary Cardiologist (physician) and Advanced Practice Providers (APPs -  Physician Assistants and Nurse Practitioners) who all work together to provide you with the care you need, when you need it.  Your next appointment:    To Be Determined  The format for your next appointment:   Either In Person or Virtual  Provider:   Dorris Carnes, MD  Other Instructions Thank you for choosing [facility]!

## 2019-12-20 ENCOUNTER — Other Ambulatory Visit (HOSPITAL_COMMUNITY): Payer: Self-pay | Admitting: Family Medicine

## 2019-12-20 DIAGNOSIS — Z1231 Encounter for screening mammogram for malignant neoplasm of breast: Secondary | ICD-10-CM

## 2020-01-12 ENCOUNTER — Ambulatory Visit (HOSPITAL_COMMUNITY)
Admission: RE | Admit: 2020-01-12 | Discharge: 2020-01-12 | Disposition: A | Discharge status: Home / Self Care | Payer: 59 | Source: Ambulatory Visit | Attending: Family Medicine | Admitting: Family Medicine

## 2020-01-12 ENCOUNTER — Other Ambulatory Visit: Payer: Self-pay

## 2020-01-12 DIAGNOSIS — Z1231 Encounter for screening mammogram for malignant neoplasm of breast: Secondary | ICD-10-CM | POA: Diagnosis not present

## 2020-04-18 ENCOUNTER — Encounter: Payer: Self-pay | Admitting: Orthopedic Surgery

## 2020-04-18 ENCOUNTER — Other Ambulatory Visit: Payer: Self-pay

## 2020-04-18 ENCOUNTER — Ambulatory Visit: Payer: Self-pay

## 2020-04-18 ENCOUNTER — Ambulatory Visit (INDEPENDENT_AMBULATORY_CARE_PROVIDER_SITE_OTHER): Payer: Self-pay | Admitting: Orthopedic Surgery

## 2020-04-18 VITALS — BP 179/104 | HR 84 | Ht 66.0 in | Wt 170.0 lb

## 2020-04-18 DIAGNOSIS — M1710 Unilateral primary osteoarthritis, unspecified knee: Secondary | ICD-10-CM

## 2020-04-18 DIAGNOSIS — M5410 Radiculopathy, site unspecified: Secondary | ICD-10-CM

## 2020-04-18 DIAGNOSIS — G8929 Other chronic pain: Secondary | ICD-10-CM

## 2020-04-18 DIAGNOSIS — M545 Low back pain, unspecified: Secondary | ICD-10-CM

## 2020-04-18 DIAGNOSIS — M1711 Unilateral primary osteoarthritis, right knee: Secondary | ICD-10-CM

## 2020-04-18 DIAGNOSIS — M76891 Other specified enthesopathies of right lower limb, excluding foot: Secondary | ICD-10-CM

## 2020-04-18 DIAGNOSIS — M25561 Pain in right knee: Secondary | ICD-10-CM

## 2020-04-18 NOTE — Progress Notes (Signed)
Chief Complaint  Patient presents with  . Knee Pain    right/ especially at night     59 yo female previously seen for OA right knee comes in to discuss ongoing pain in the right knee associated with radiating pain down the right leg, and pain and swelling over the pes bursa.  She is adamant about not getting an injection    Review of Systems  Constitutional: Negative for fever and malaise/fatigue.  Neurological: Negative for tingling, sensory change and weakness.     Past Medical History:  Diagnosis Date  . Anxiety   . Arthritis   . Back pain   . Chest pain 01/2013   Admitted to [facility] in 01/2013  . Environmental allergies   . GERD (gastroesophageal reflux disease)   . Headache(784.0)   . Hypercholesteremia   . Hypertension 01/13/2013   BP (!) 179/104   Pulse 84   Ht 5\' 6"  (1.676 m)   Wt 170 lb (77.1 kg)   BMI 27.44 kg/m   Appearance well groomed normal weight   Awake alert excellent mood   Oriented x 3   Physical Exam Musculoskeletal:     Comments: Lumbar: tenderness on the right and L4-5 and L5-S1  Right knee: swelling and tenderness medial proximal knee; over the pes tendons  rom knee painless and full  All ligs are stable  Motor normal  Skin intact     Encounter Diagnoses  Name Primary?  . Chronic pain of right knee Yes  . Pes anserinus tendinitis of right lower extremity   . Primary localized osteoarthritis of knee   . Radicular leg pain   . Chronic low back pain, unspecified back pain laterality, unspecified whether sciatica present    No orders of the defined types were placed in this encounter.  rec aleve BID    Current Outpatient Medications:  .  acetaminophen (TYLENOL) 325 MG tablet, Take 650 mg by mouth every 6 (six) hours as needed., Disp: , Rfl:  .  aspirin EC 81 MG tablet, Take 81 mg by mouth daily., Disp: , Rfl:  .  cholecalciferol (VITAMIN D) 1000 units tablet, Take 1,000 Units by mouth daily., Disp: , Rfl:  .  diclofenac sodium  (VOLTAREN) 1 % GEL, Apply 2 g topically 4 (four) times daily., Disp: 100 g, Rfl: 0 .  diphenhydrAMINE (BENADRYL) 25 MG tablet, Take 50 mg by mouth 2 (two) times daily as needed for allergies., Disp: , Rfl:  .  HYDROcodone-acetaminophen (NORCO) 7.5-325 MG tablet, Take 1 tablet by mouth 3 (three) times daily., Disp: , Rfl:  .  lisinopril-hydrochlorothiazide (PRINZIDE,ZESTORETIC) 20-12.5 MG tablet, Take 1 tablet by mouth daily. , Disp: , Rfl: 4 .  naproxen (NAPROSYN) 500 MG tablet, Take 1 tablet by mouth 2 (two) times daily., Disp: , Rfl: 4 .  potassium chloride SA (K-DUR,KLOR-CON) 20 MEQ tablet, Take 1 tablet (20 mEq total) by mouth daily., Disp: 30 tablet, Rfl: 0 .  rosuvastatin (CRESTOR) 10 MG tablet, Take 10 mg by mouth daily., Disp: , Rfl:  .  amLODipine (NORVASC) 2.5 MG tablet, Take 1 tablet (2.5 mg total) by mouth daily. (Patient not taking: Reported on 04/18/2020), Disp: 30 tablet, Rfl: 11  Chronic x 2 or more medium risk of morbitiy

## 2020-04-18 NOTE — Patient Instructions (Signed)
You have Bursitis of your knee Ice three times a day Use capsaicin cream 3 times a day, make sure you rub it in really well each time you use it.   take Aleve one twice daily for the inflammation   to take the place of the Naprosyn   For back continue your Hydrocodone, try sleeping with pillows under your knees and use ice on your back three times daily

## 2020-04-19 ENCOUNTER — Encounter: Payer: Self-pay | Admitting: Orthopedic Surgery

## 2021-01-11 ENCOUNTER — Emergency Department (HOSPITAL_COMMUNITY): Payer: 59

## 2021-01-11 ENCOUNTER — Encounter (HOSPITAL_COMMUNITY): Payer: Self-pay | Admitting: *Deleted

## 2021-01-11 ENCOUNTER — Emergency Department (HOSPITAL_COMMUNITY)
Admission: EM | Admit: 2021-01-11 | Discharge: 2021-01-11 | Disposition: A | Discharge status: Home / Self Care | Payer: 59 | Attending: Emergency Medicine | Admitting: Emergency Medicine

## 2021-01-11 ENCOUNTER — Other Ambulatory Visit: Payer: Self-pay

## 2021-01-11 DIAGNOSIS — I10 Essential (primary) hypertension: Secondary | ICD-10-CM | POA: Insufficient documentation

## 2021-01-11 DIAGNOSIS — R202 Paresthesia of skin: Secondary | ICD-10-CM | POA: Diagnosis not present

## 2021-01-11 DIAGNOSIS — M546 Pain in thoracic spine: Secondary | ICD-10-CM | POA: Diagnosis not present

## 2021-01-11 DIAGNOSIS — M79604 Pain in right leg: Secondary | ICD-10-CM | POA: Insufficient documentation

## 2021-01-11 DIAGNOSIS — Z79899 Other long term (current) drug therapy: Secondary | ICD-10-CM | POA: Diagnosis not present

## 2021-01-11 DIAGNOSIS — Z7982 Long term (current) use of aspirin: Secondary | ICD-10-CM | POA: Insufficient documentation

## 2021-01-11 DIAGNOSIS — M25561 Pain in right knee: Secondary | ICD-10-CM | POA: Diagnosis not present

## 2021-01-11 MED ORDER — KETOROLAC TROMETHAMINE 30 MG/ML IJ SOLN
30.0000 mg | Freq: Once | INTRAMUSCULAR | Status: AC
  Administered 2021-01-11: 30 mg via INTRAMUSCULAR
  Filled 2021-01-11: qty 1

## 2021-01-11 MED ORDER — PREDNISONE 20 MG PO TABS: 40.0000 mg | ORAL_TABLET | Freq: Every day | ORAL | 0 refills | Status: AC

## 2021-01-11 NOTE — ED Triage Notes (Signed)
Right leg pain for quite sometime, worse past 2 days

## 2021-01-11 NOTE — Discharge Instructions (Signed)
I suspect your knee pain is from the cystic changes in your femoral condyle.  I start you on steroids please take as prescribed.  You may also take over-the-counter pain medications like ibuprofen and or Tylenol every 6 hours as needed please follow dosing the back of bottle.  I have given you a knee sleeve please use during the day take off at nighttime.  Is important a follow-up with your orthopedic doctor for further evaluation please call to schedule a follow-up appointment.  Come back to the emergency department if you develop chest pain, shortness of breath, severe abdominal pain, uncontrolled nausea, vomiting, diarrhea.

## 2021-01-11 NOTE — ED Provider Notes (Signed)
[facility] EMERGENCY DEPARTMENT Provider Note   CSN: [id number] Arrival date & time: 01/11/21  1247     History Chief Complaint  Patient presents with  . Leg Pain    Kendra Valenzuela is a 60 y.o. female.  HPI   Patient with medical history of arthritis, GERD, headaches, hypertension presents to the emergency department with chief complaint of right knee pain.  Patient states she been having knee pain for last couple months but over the last few days pain is significantly worsened.  She denies  trauma to her knee, states it came on suddenly.  She does endorse that she walks around a lot for her job.  Patient describes the pain as a sharp sensation on the lateral aspect of her tibial plateau, will radiate up into her back, she has occasional paresthesias in her toes, she denies difficulty moving her toes ankle knee or hip, she denies  urinary symptoms, denies urinary consult, retention, difficult bowel movements.  She has no associated abdominal pain, nausea, vomiting flank pain.  Patient denies IV drug use, denies autoimmune diseases, GERD, denies systemic infection fevers or chills.  Patient is taking hydrocodone without relief.  Patient denies headaches, fevers, chills, shortness of breath, chest pain, abdominal pain, nausea, vomiting, diarrhea, worsening pedal edema.  Past Medical History:  Diagnosis Date  . Anxiety   . Arthritis   . Back pain   . Chest pain 01/2013   Admitted to [facility] in 01/2013  . Environmental allergies   . GERD (gastroesophageal reflux disease)   . Headache(784.0)   . Hypercholesteremia   . Hypertension 01/13/2013    Patient Active Problem List   Diagnosis Date Noted  . Polyp of transverse colon   . Encounter for screening colonoscopy 05/27/2017  . ETOH abuse 05/27/2017  . Bilateral knee pain 11/01/2015  . Right knee pain 10/18/2015  . Left knee pain 10/18/2015  . Unsteady gait 08/10/2013  . Altered mental state 08/10/2013  . Acute encephalopathy  08/10/2013  . Headache 08/10/2013  . Hypokalemia 08/10/2013  . Anemia 08/10/2013  . Hypercholesterolemia 01/21/2013  . Chronic back pain 01/13/2013  . Hypertension 01/13/2013  . Chest pain 01/07/2013    Past Surgical History:  Procedure Laterality Date  . CARDIAC CATHETERIZATION    . CHOLECYSTECTOMY N/A 01/21/2014   Procedure: LAPAROSCOPIC CHOLECYSTECTOMY;  Surgeon: Jamesetta So, MD;  Location: [facility] ORS;  Service: General;  Laterality: N/A;  . COLONOSCOPY WITH PROPOFOL N/A 06/17/2017   Procedure: COLONOSCOPY WITH PROPOFOL;  Surgeon: Danie Binder, MD;  Location: [facility] ENDO SUITE;  Service: Endoscopy;  Laterality: N/A;  12:45pm  . DILATION AND CURETTAGE OF UTERUS    . LEFT HEART CATHETERIZATION WITH CORONARY ANGIOGRAM N/A 02/08/2013   Procedure: LEFT HEART CATHETERIZATION WITH CORONARY ANGIOGRAM;  Surgeon: Burnell Blanks, MD;  Location: [facility] CATH LAB;  Service: Cardiovascular;  Laterality: N/A;  . POLYPECTOMY  06/17/2017   Procedure: POLYPECTOMY;  Surgeon: Danie Binder, MD;  Location: [facility] ENDO SUITE;  Service: Endoscopy;;  colon   . UNILATERAL SALPINGECTOMY Right    due to ectopic pregnancy     OB History    Gravida      Para      Term      Preterm      AB      Living  0     SAB      IAB      Ectopic      Multiple  Live Births              Family History  Problem Relation Age of Onset  . Hypertension Father   . Colon cancer Neg Hx   . Colon polyps Neg Hx     Social History   Tobacco Use  . Smoking status: Never Smoker  . Smokeless tobacco: Never Used  Vaping Use  . Vaping Use: Never used  Substance Use Topics  . Alcohol use: Not Currently    Alcohol/week: 14.0 standard drinks    Types: 14 Cans of beer per week    Comment: 2 can 24 ounce beers daily  . Drug use: No    Home Medications Prior to Admission medications   Medication Sig Start Date End Date Taking? Authorizing Provider  predniSONE (DELTASONE) 20 MG tablet Take 2 tablets  (40 mg total) by mouth daily for 5 days. 01/11/21 01/16/21 Yes Marcello Fennel, PA-C  acetaminophen (TYLENOL) 325 MG tablet Take 650 mg by mouth every 6 (six) hours as needed.    [provider]  amLODipine (NORVASC) 2.5 MG tablet Take 1 tablet (2.5 mg total) by mouth daily. Patient not taking: Reported on 04/18/2020 10/18/19 01/16/20  Fay Records, MD  aspirin EC 81 MG tablet Take 81 mg by mouth daily.    [provider]  cholecalciferol (VITAMIN D) 1000 units tablet Take 1,000 Units by mouth daily.    [provider]  diclofenac sodium (VOLTAREN) 1 % GEL Apply 2 g topically 4 (four) times daily. [id number]   Delora Fuel, MD  diphenhydrAMINE (BENADRYL) 25 MG tablet Take 50 mg by mouth 2 (two) times daily as needed for allergies.    [provider]  HYDROcodone-acetaminophen (NORCO) 7.5-325 MG tablet Take 1 tablet by mouth 3 (three) times daily.    [provider]  HYDROcodone-acetaminophen (NORCO/VICODIN) 5-325 MG tablet 1 or 2 po q6h prn pain Patient not taking: Reported on 04/18/2020 07/26/18   Lily Kocher, PA-C  lisinopril-hydrochlorothiazide (PRINZIDE,ZESTORETIC) 20-12.5 MG tablet Take 1 tablet by mouth daily.  01/29/18   [provider]  naproxen (NAPROSYN) 500 MG tablet Take 1 tablet by mouth 2 (two) times daily. 07/16/18   [provider]  potassium chloride SA (K-DUR,KLOR-CON) 20 MEQ tablet Take 1 tablet (20 mEq total) by mouth daily. [id number]   Delora Fuel, MD  rosuvastatin (CRESTOR) 10 MG tablet Take 10 mg by mouth daily.    [provider]    Allergies    Patient has no known allergies.  Review of Systems   Review of Systems  Constitutional: Negative for chills and fever.  HENT: Negative for congestion.   Respiratory: Negative for shortness of breath.   Cardiovascular: Negative for chest pain.  Gastrointestinal: Negative for abdominal pain.  Genitourinary: Negative for enuresis.  Musculoskeletal: Positive for  back pain.       Right knee pain  Skin: Negative for rash.  Neurological: Negative for dizziness and headaches.  Hematological: Does not bruise/bleed easily.    Physical Exam Updated Vital Signs BP (!) 180/87 (BP Location: Right Arm)   Pulse 77   Temp 98.5 F (36.9 C) (Oral)   Resp 18   Ht 5\' 6"  (1.676 m)   Wt 81.6 kg   SpO2 100%   BMI 29.05 kg/m   Physical Exam Vitals and nursing note reviewed.  Constitutional:      General: She is not in acute distress.    Appearance: She is not ill-appearing.  HENT:  Head: Normocephalic and atraumatic.     Nose: No congestion.  Eyes:     Conjunctiva/sclera: Conjunctivae normal.  Cardiovascular:     Rate and Rhythm: Normal rate and regular rhythm.     Pulses: Normal pulses.     Heart sounds: No murmur heard. No friction rub. No gallop.   Pulmonary:     Effort: No respiratory distress.     Breath sounds: No wheezing, rhonchi or rales.  Musculoskeletal:        General: Tenderness present.     Right lower leg: No edema.     Left lower leg: No edema.     Comments: Spine was palpated, is nontender to palpation, no step-off or deformities present.  Patient slight tenderness within the musculature along her flank, negative CVA tenderness.  Right knee was visualized there is no edema or erythema, patient has full range of motion at her toes ankle, knee and hip, patient was slightly tender to palpation on the lateral aspect of her right tibial plateau, no deformities noted.  No joint laxity present neurovascular fully intact.  Skin:    General: Skin is warm and dry.  Neurological:     Mental Status: She is alert.  Psychiatric:        Mood and Affect: Mood normal.     ED Results / Procedures / Treatments   Labs (all labs ordered are listed, but only abnormal results are displayed) Labs Reviewed - No data to display  EKG None  Radiology DG Knee Complete 4 Views Right  Result Date: 01/11/2021 CLINICAL DATA:  Knee pain EXAM:  RIGHT KNEE - COMPLETE 4+ VIEW COMPARISON:  04/18/2020 FINDINGS: Degenerative change of the lateral compartment with subchondral lucencies in the lateral femoral condyle. Findings mildly progressed from 04/18/2020. There is spurring of the lateral medial compartment. There is joint space narrowing of the medial compartment. IMPRESSION: 1. Chronic subchondral cystic change in the lateral femoral condyle appears mildly progressed. Consider MRI evaluation. 2. Bi- compartment osteoarthritis similar prior. Electronically Signed   By: Suzy Bouchard M.D.   On: 01/11/2021 14:14    Procedures Procedures   Medications Ordered in ED Medications  ketorolac (TORADOL) 30 MG/ML injection 30 mg (has no administration in time range)    ED Course  I have reviewed the triage vital signs and the nursing notes.  Pertinent labs & imaging results that were available during my care of the patient were reviewed by me and considered in my medical decision making (see chart for details).    MDM Rules/Calculators/A&P                         Initial impression-patient presents with right knee pain and right upper back pain.  She is alert, does not appear in acute distress, vital signs reassuring.  Suspect this is a muscular strain versus osteoarthritis will obtain imaging for further evaluation.  Work-up-imaging reveals chronic subchondral cystic change in the left femoral condyle appears mildly progressed recommends follow-up MRI.  By compartment osteoarthritis.  Rule out- I have low suspicion for septic arthritis as patient denies IV drug use, skin exam was performed no erythematous, edematous, warm joints noted on exam, no new heart murmur heard on exam.  Low suspicion for fracture or dislocation as x-ray does not feel any significant findings. low suspicion for ligament or tendon damage as area was palpated no gross defects noted, she has  full range of motion as well as 5/5 strength.  Low suspicion for compartment  syndrome as area was palpated it was soft to the touch, neurovascular fully intact.   Plan-  1. Right knee pain -suspect secondary due to  changes on the condyle, will place in a knee knee brace, provide her with steroids have her follow-up with orthopedic surgery for further evaluation.  Vital signs have remained stable, no indication for hospital admission. Patient given at home care as well strict return precautions.  Patient verbalized that they understood agreed to said plan.   Final Clinical Impression(s) / ED Diagnoses Final diagnoses:  Acute pain of right knee    Rx / DC Orders ED Discharge Orders         Ordered    predniSONE (DELTASONE) 20 MG tablet  Daily        01/11/21 1444           Aron Baba 01/11/21 1445    Milton Ferguson, MD 01/11/21 [phone]

## 2022-01-29 ENCOUNTER — Other Ambulatory Visit: Payer: Self-pay

## 2022-01-29 ENCOUNTER — Emergency Department (HOSPITAL_COMMUNITY)
Admission: EM | Admit: 2022-01-29 | Discharge: 2022-01-29 | Disposition: A | Discharge status: Home / Self Care | Payer: BC Managed Care – PPO | Attending: Emergency Medicine | Admitting: Emergency Medicine

## 2022-01-29 ENCOUNTER — Emergency Department (HOSPITAL_COMMUNITY): Payer: BC Managed Care – PPO

## 2022-01-29 ENCOUNTER — Encounter (HOSPITAL_COMMUNITY): Payer: Self-pay

## 2022-01-29 DIAGNOSIS — X500XXA Overexertion from strenuous movement or load, initial encounter: Secondary | ICD-10-CM | POA: Diagnosis not present

## 2022-01-29 DIAGNOSIS — S39012A Strain of muscle, fascia and tendon of lower back, initial encounter: Secondary | ICD-10-CM | POA: Insufficient documentation

## 2022-01-29 DIAGNOSIS — Y990 Civilian activity done for income or pay: Secondary | ICD-10-CM | POA: Insufficient documentation

## 2022-01-29 DIAGNOSIS — Z7982 Long term (current) use of aspirin: Secondary | ICD-10-CM | POA: Insufficient documentation

## 2022-01-29 DIAGNOSIS — M545 Low back pain, unspecified: Secondary | ICD-10-CM | POA: Diagnosis not present

## 2022-01-29 DIAGNOSIS — S3992XA Unspecified injury of lower back, initial encounter: Secondary | ICD-10-CM | POA: Diagnosis not present

## 2022-01-29 MED ORDER — LIDOCAINE 5 % EX PTCH
1.0000 | MEDICATED_PATCH | CUTANEOUS | Status: DC
  Administered 2022-01-29: 1 via TRANSDERMAL
  Filled 2022-01-29: qty 1

## 2022-01-29 MED ORDER — LIDOCAINE 5 % EX PTCH: 1.0000 | MEDICATED_PATCH | CUTANEOUS | 0 refills | Status: DC

## 2022-01-29 MED ORDER — HYDROCODONE-ACETAMINOPHEN 5-325 MG PO TABS
1.0000 | ORAL_TABLET | Freq: Once | ORAL | Status: AC
  Administered 2022-01-29: 1 via ORAL
  Filled 2022-01-29: qty 1

## 2022-01-29 MED ORDER — HYDROCODONE-ACETAMINOPHEN 5-325 MG PO TABS: 1.0000 | ORAL_TABLET | Freq: Four times a day (QID) | ORAL | 0 refills | Status: AC | PRN

## 2022-01-29 NOTE — ED Triage Notes (Signed)
Patient with lower back that moves into both upper legs since Sunday.

## 2022-01-29 NOTE — Discharge Instructions (Addendum)
Continue taking your meloxicam.  Do not drive within 4 hours of taking hydrocodone as this will make you drowsy.  Avoid lifting,  Bending,  Twisting or any other activity that worsens your pain over the next week.  Apply heating pad to your lower back twice daily before adding the pain patch and then after removing the patch after 12 hours of use.  You should get rechecked if your symptoms are not better over the next 5 days,  Or you develop increased pain,  Weakness in your leg(s) or loss of bladder or bowel function - these are symptoms of a worse injury.

## 2022-01-29 NOTE — ED Provider Notes (Signed)
[facility] EMERGENCY DEPARTMENT Provider Note   CSN: [id number] Arrival date & time: 01/29/22  0920     History  Chief Complaint  Patient presents with   Back Pain    Kendra Valenzuela is a 61 y.o. female.  The history is provided by the patient.  Back Pain Location:  Lumbar spine Quality:  Aching Radiates to:  L thigh and R thigh Pain severity:  Moderate Pain is:  Same all the time Onset quality:  Gradual (started gradually 2 days ago after lifting a patient at work) Timing:  Constant Progression:  Unchanged Chronicity:  Recurrent Context: occupational injury   Relieved by:  Nothing (has been taking tylenol and using topical biofreeze without relief) Worsened by:  Movement Associated symptoms: no abdominal pain, no abdominal swelling, no bladder incontinence, no bowel incontinence, no dysuria, no fever, no paresthesias, no perianal numbness, no tingling and no weakness       Home Medications Prior to Admission medications   Medication Sig Start Date End Date Taking? Authorizing Provider  HYDROcodone-acetaminophen (NORCO/VICODIN) 5-325 MG tablet Take 1 tablet by mouth every 6 (six) hours as needed for severe pain. 01/29/22  Yes Markevious Ehmke, Almyra Free, PA-C  lidocaine (LIDODERM) 5 % Place 1 patch onto the skin daily. Remove & Discard patch within 12 hours or as directed by MD 01/29/22  Yes Ante Arredondo, Almyra Free, PA-C  acetaminophen (TYLENOL) 325 MG tablet Take 650 mg by mouth every 6 (six) hours as needed.    [provider]  amLODipine (NORVASC) 2.5 MG tablet Take 1 tablet (2.5 mg total) by mouth daily. Patient not taking: Reported on 04/18/2020 10/18/19 01/16/20  Fay Records, MD  aspirin EC 81 MG tablet Take 81 mg by mouth daily.    [provider]  cholecalciferol (VITAMIN D) 1000 units tablet Take 1,000 Units by mouth daily.    [provider]  diclofenac sodium (VOLTAREN) 1 % GEL Apply 2 g topically 4 (four) times daily. 05/08/55   Delora Fuel, MD   diphenhydrAMINE (BENADRYL) 25 MG tablet Take 50 mg by mouth 2 (two) times daily as needed for allergies.    [provider]  lisinopril-hydrochlorothiazide (PRINZIDE,ZESTORETIC) 20-12.5 MG tablet Take 1 tablet by mouth daily.  01/29/18   [provider]  naproxen (NAPROSYN) 500 MG tablet Take 1 tablet by mouth 2 (two) times daily. 07/16/18   [provider]  potassium chloride SA (K-DUR,KLOR-CON) 20 MEQ tablet Take 1 tablet (20 mEq total) by mouth daily. 10/22/06   Delora Fuel, MD  rosuvastatin (CRESTOR) 10 MG tablet Take 10 mg by mouth daily.    [provider]      Allergies    Patient has no known allergies.    Review of Systems   Review of Systems  Constitutional:  Negative for fever.  Gastrointestinal:  Negative for abdominal pain and bowel incontinence.  Genitourinary:  Negative for bladder incontinence and dysuria.  Musculoskeletal:  Positive for back pain.  Neurological:  Negative for tingling, weakness and paresthesias.   Physical Exam Updated Vital Signs BP (!) 146/85 (BP Location: Right Arm)   Pulse 100   Temp 97.8 F (36.6 C) (Oral)   Resp 20   Ht '5\' 6"'$  (1.676 m)   Wt 78 kg   SpO2 100%   BMI 27.76 kg/m  Physical Exam Vitals and nursing note reviewed.  Constitutional:      Appearance: She is well-developed.  HENT:     Head: Normocephalic.  Eyes:  Conjunctiva/sclera: Conjunctivae normal.  Cardiovascular:     Rate and Rhythm: Normal rate.     Pulses: Normal pulses.     Comments: Pedal pulses normal. Pulmonary:     Effort: Pulmonary effort is normal.  Abdominal:     General: Bowel sounds are normal. There is no distension.     Palpations: Abdomen is soft. There is no mass.  Musculoskeletal:        General: Normal range of motion.     Cervical back: Normal range of motion and neck supple.     Lumbar back: Tenderness present. No swelling, edema or spasms.  Skin:    General: Skin is warm and dry.  Neurological:      Mental Status: She is alert.     Sensory: No sensory deficit.     Motor: No tremor or atrophy.     Gait: Gait normal.     Deep Tendon Reflexes:     Reflex Scores:      Patellar reflexes are 2+ on the right side and 2+ on the left side.      Achilles reflexes are 2+ on the right side and 2+ on the left side.    Comments: No strength deficit noted in hip and knee flexor and extensor muscle groups.  Ankle flexion and extension intact.    ED Results / Procedures / Treatments   Labs (all labs ordered are listed, but only abnormal results are displayed) Labs Reviewed - No data to display  EKG None  Radiology DG Lumbar Spine Complete  Result Date: 01/29/2022 CLINICAL DATA:  Lower back pain radiating into the thighs EXAM: LUMBAR SPINE - COMPLETE 4+ VIEW COMPARISON:  Radiograph July 25, 2018. FINDINGS: There is no evidence of lumbar spine fracture. Alignment is normal. Similar mild multilevel degenerative changes spine. Cholecystectomy clips. Calcified uterine leiomyoma. IMPRESSION: No acute osseous abnormality in the lumbar spine. Electronically Signed   By: Dahlia Bailiff M.D.   On: 01/29/2022 10:22    Procedures Procedures    Medications Ordered in ED Medications  lidocaine (LIDODERM) 5 % 1 patch (has no administration in time range)  HYDROcodone-acetaminophen (NORCO/VICODIN) 5-325 MG per tablet 1 tablet (1 tablet Oral Given 01/29/22 1016)    ED Course/ Medical Decision Making/ A&P                           Medical Decision Making Patient with acute low back pain after attempting to lift a patient at work.  She has no signs or symptoms suggesting neurosurgical emergency, she has no neuropathy present.  She is currently on meloxicam, encouraged to continue taking this medication.  She was given a few hydrocodone tablets, also was given Lidoderm patch for topical pain relief.  Return instructions were provided.  Her PCP has retired, she was given referrals for obtaining a new  PCP.  Amount and/or Complexity of Data Reviewed Radiology: ordered and independent interpretation performed.    Details: No acute lumbar findings.  Also no exam history to suggest epidural abscess, she has no point tenderness.  There is no signs or symptoms of cauda equina  Risk Prescription drug management.           Final Clinical Impression(s) / ED Diagnoses Final diagnoses:  Strain of lumbar region, initial encounter    Rx / DC Orders ED Discharge Orders          Ordered    HYDROcodone-acetaminophen (NORCO/VICODIN) 5-325 MG tablet  Every 6 hours PRN        01/29/22 1105    lidocaine (LIDODERM) 5 %  Every 24 hours        01/29/22 1105              Evalee Jefferson, Hershal Coria 01/29/22 1110    Hayden Rasmussen, MD 01/29/22 1820

## 2022-03-22 DIAGNOSIS — E785 Hyperlipidemia, unspecified: Secondary | ICD-10-CM | POA: Diagnosis not present

## 2022-03-22 DIAGNOSIS — E559 Vitamin D deficiency, unspecified: Secondary | ICD-10-CM | POA: Diagnosis not present

## 2022-03-22 DIAGNOSIS — K219 Gastro-esophageal reflux disease without esophagitis: Secondary | ICD-10-CM | POA: Diagnosis not present

## 2022-03-22 DIAGNOSIS — G894 Chronic pain syndrome: Secondary | ICD-10-CM | POA: Diagnosis not present

## 2022-03-25 DIAGNOSIS — E785 Hyperlipidemia, unspecified: Secondary | ICD-10-CM | POA: Diagnosis not present

## 2022-03-25 DIAGNOSIS — E559 Vitamin D deficiency, unspecified: Secondary | ICD-10-CM | POA: Diagnosis not present

## 2022-03-25 DIAGNOSIS — I10 Essential (primary) hypertension: Secondary | ICD-10-CM | POA: Diagnosis not present

## 2022-04-02 ENCOUNTER — Other Ambulatory Visit (HOSPITAL_COMMUNITY): Payer: Self-pay | Admitting: Family Medicine

## 2022-04-02 DIAGNOSIS — K219 Gastro-esophageal reflux disease without esophagitis: Secondary | ICD-10-CM | POA: Diagnosis not present

## 2022-04-02 DIAGNOSIS — E785 Hyperlipidemia, unspecified: Secondary | ICD-10-CM | POA: Diagnosis not present

## 2022-04-02 DIAGNOSIS — G894 Chronic pain syndrome: Secondary | ICD-10-CM | POA: Diagnosis not present

## 2022-04-02 DIAGNOSIS — Z1231 Encounter for screening mammogram for malignant neoplasm of breast: Secondary | ICD-10-CM

## 2022-04-02 DIAGNOSIS — E559 Vitamin D deficiency, unspecified: Secondary | ICD-10-CM | POA: Diagnosis not present

## 2022-04-15 DIAGNOSIS — Z1211 Encounter for screening for malignant neoplasm of colon: Secondary | ICD-10-CM | POA: Diagnosis not present

## 2022-05-06 ENCOUNTER — Ambulatory Visit (HOSPITAL_COMMUNITY)
Admission: RE | Admit: 2022-05-06 | Discharge: 2022-05-06 | Disposition: A | Discharge status: Home / Self Care | Payer: BC Managed Care – PPO | Source: Ambulatory Visit | Attending: Family Medicine | Admitting: Family Medicine

## 2022-05-06 DIAGNOSIS — Z1231 Encounter for screening mammogram for malignant neoplasm of breast: Secondary | ICD-10-CM | POA: Insufficient documentation

## 2022-07-05 DIAGNOSIS — I10 Essential (primary) hypertension: Secondary | ICD-10-CM | POA: Diagnosis not present

## 2022-07-05 DIAGNOSIS — E785 Hyperlipidemia, unspecified: Secondary | ICD-10-CM | POA: Diagnosis not present

## 2022-07-05 DIAGNOSIS — E559 Vitamin D deficiency, unspecified: Secondary | ICD-10-CM | POA: Diagnosis not present

## 2022-07-10 DIAGNOSIS — E785 Hyperlipidemia, unspecified: Secondary | ICD-10-CM | POA: Diagnosis not present

## 2022-07-10 DIAGNOSIS — G894 Chronic pain syndrome: Secondary | ICD-10-CM | POA: Diagnosis not present

## 2022-07-10 DIAGNOSIS — K219 Gastro-esophageal reflux disease without esophagitis: Secondary | ICD-10-CM | POA: Diagnosis not present

## 2022-07-10 DIAGNOSIS — E559 Vitamin D deficiency, unspecified: Secondary | ICD-10-CM | POA: Diagnosis not present

## 2022-07-31 ENCOUNTER — Emergency Department (HOSPITAL_COMMUNITY)
Admission: EM | Admit: 2022-07-31 | Discharge: 2022-07-31 | Disposition: A | Discharge status: Home / Self Care | Payer: BC Managed Care – PPO | Attending: Emergency Medicine | Admitting: Emergency Medicine

## 2022-07-31 DIAGNOSIS — Y92009 Unspecified place in unspecified non-institutional (private) residence as the place of occurrence of the external cause: Secondary | ICD-10-CM | POA: Diagnosis not present

## 2022-07-31 DIAGNOSIS — S79921A Unspecified injury of right thigh, initial encounter: Secondary | ICD-10-CM | POA: Diagnosis not present

## 2022-07-31 DIAGNOSIS — X19XXXA Contact with other heat and hot substances, initial encounter: Secondary | ICD-10-CM | POA: Insufficient documentation

## 2022-07-31 DIAGNOSIS — T24111A Burn of first degree of right thigh, initial encounter: Secondary | ICD-10-CM | POA: Insufficient documentation

## 2022-07-31 DIAGNOSIS — Y93G3 Activity, cooking and baking: Secondary | ICD-10-CM | POA: Insufficient documentation

## 2022-07-31 DIAGNOSIS — Z7982 Long term (current) use of aspirin: Secondary | ICD-10-CM | POA: Insufficient documentation

## 2022-07-31 DIAGNOSIS — T300 Burn of unspecified body region, unspecified degree: Secondary | ICD-10-CM

## 2022-07-31 DIAGNOSIS — Y990 Civilian activity done for income or pay: Secondary | ICD-10-CM | POA: Insufficient documentation

## 2022-07-31 DIAGNOSIS — T310 Burns involving less than 10% of body surface: Secondary | ICD-10-CM | POA: Diagnosis not present

## 2022-07-31 DIAGNOSIS — T24231A Burn of second degree of right lower leg, initial encounter: Secondary | ICD-10-CM | POA: Diagnosis not present

## 2022-07-31 MED ORDER — SILVER SULFADIAZINE 1 % EX CREA
TOPICAL_CREAM | Freq: Once | CUTANEOUS | Status: AC
  Filled 2022-07-31: qty 50
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
## 2022-10-16 ENCOUNTER — Ambulatory Visit (HOSPITAL_COMMUNITY)
Admission: RE | Admit: 2022-10-16 | Discharge: 2022-10-16 | Disposition: A | Discharge status: Home / Self Care | Payer: BC Managed Care – PPO | Source: Ambulatory Visit | Attending: Family Medicine | Admitting: Family Medicine

## 2022-10-16 ENCOUNTER — Other Ambulatory Visit (HOSPITAL_COMMUNITY): Payer: Self-pay | Admitting: Family Medicine

## 2022-10-16 DIAGNOSIS — M79675 Pain in left toe(s): Secondary | ICD-10-CM | POA: Diagnosis not present

## 2022-10-16 DIAGNOSIS — M25552 Pain in left hip: Secondary | ICD-10-CM

## 2022-10-16 DIAGNOSIS — F5104 Psychophysiologic insomnia: Secondary | ICD-10-CM | POA: Diagnosis not present

## 2022-10-16 DIAGNOSIS — M79672 Pain in left foot: Secondary | ICD-10-CM | POA: Diagnosis not present

## 2022-10-16 DIAGNOSIS — G894 Chronic pain syndrome: Secondary | ICD-10-CM | POA: Diagnosis not present

## 2022-10-16 DIAGNOSIS — M7989 Other specified soft tissue disorders: Secondary | ICD-10-CM | POA: Diagnosis not present

## 2022-10-18 ENCOUNTER — Emergency Department (HOSPITAL_COMMUNITY)
Admission: EM | Admit: 2022-10-18 | Discharge: 2022-10-18 | Disposition: A | Discharge status: Home / Self Care | Payer: BC Managed Care – PPO | Attending: Emergency Medicine | Admitting: Emergency Medicine

## 2022-10-18 ENCOUNTER — Encounter (HOSPITAL_COMMUNITY): Payer: Self-pay | Admitting: Emergency Medicine

## 2022-10-18 ENCOUNTER — Other Ambulatory Visit: Payer: Self-pay

## 2022-10-18 ENCOUNTER — Emergency Department (HOSPITAL_COMMUNITY): Payer: BC Managed Care – PPO

## 2022-10-18 DIAGNOSIS — R0789 Other chest pain: Secondary | ICD-10-CM | POA: Diagnosis not present

## 2022-10-18 DIAGNOSIS — Z79899 Other long term (current) drug therapy: Secondary | ICD-10-CM | POA: Insufficient documentation

## 2022-10-18 DIAGNOSIS — I10 Essential (primary) hypertension: Secondary | ICD-10-CM | POA: Insufficient documentation

## 2022-10-18 DIAGNOSIS — Z955 Presence of coronary angioplasty implant and graft: Secondary | ICD-10-CM | POA: Insufficient documentation

## 2022-10-18 DIAGNOSIS — R000 Tachycardia, unspecified: Secondary | ICD-10-CM | POA: Diagnosis not present

## 2022-10-18 DIAGNOSIS — R0602 Shortness of breath: Secondary | ICD-10-CM | POA: Insufficient documentation

## 2022-10-18 DIAGNOSIS — R079 Chest pain, unspecified: Secondary | ICD-10-CM | POA: Diagnosis not present

## 2022-10-18 DIAGNOSIS — R0902 Hypoxemia: Secondary | ICD-10-CM | POA: Diagnosis not present

## 2022-10-18 DIAGNOSIS — Z7982 Long term (current) use of aspirin: Secondary | ICD-10-CM | POA: Diagnosis not present

## 2022-10-18 LAB — BASIC METABOLIC PANEL
Anion gap: 11 (ref 5–15)
BUN: 14 mg/dL (ref 8–23)
CO2: 27 mmol/L (ref 22–32)
Calcium: 8.9 mg/dL (ref 8.9–10.3)
Chloride: 103 mmol/L (ref 98–111)
Creatinine, Ser: 0.92 mg/dL (ref 0.44–1.00)
GFR, Estimated: >60 mL/min (ref >60)
Glucose, Bld: 125 mg/dL — ABNORMAL HIGH (ref 70–99)
Potassium: 3.1 mmol/L — ABNORMAL LOW (ref 3.5–5.1)
Sodium: 141 mmol/L (ref 135–145)

## 2022-10-18 LAB — CBC
HCT: 34.2 % — ABNORMAL LOW (ref 36.0–46.0)
Hemoglobin: 10.5 g/dL — ABNORMAL LOW (ref 12.0–15.0)
MCH: 26.7 pg (ref 26.0–34.0)
MCHC: 30.7 g/dL (ref 30.0–36.0)
MCV: 87 fL (ref 80.0–100.0)
Platelets: 331 10*3/uL (ref 150–400)
RBC: 3.93 MIL/uL (ref 3.87–5.11)
RDW: 13.9 % (ref 11.5–15.5)
WBC: 9 10*3/uL (ref 4.0–10.5)
nRBC: 0 % (ref 0.0–0.2)

## 2022-10-18 LAB — TROPONIN I (HIGH SENSITIVITY)
Troponin I (High Sensitivity): 2 ng/L (ref <18)
Troponin I (High Sensitivity): <2 ng/L (ref <18)

## 2022-10-18 LAB — D-DIMER, QUANTITATIVE: D-Dimer, Quant: 0.62 ug/mL-FEU — ABNORMAL HIGH (ref 0.00–0.50)

## 2022-10-18 MED ORDER — MORPHINE SULFATE (PF) 4 MG/ML IV SOLN
4.0000 mg | Freq: Once | INTRAVENOUS | Status: AC
  Administered 2022-10-18: 4 mg via INTRAVENOUS
  Filled 2022-10-18: qty 1

## 2022-10-18 MED ORDER — POTASSIUM CHLORIDE CRYS ER 20 MEQ PO TBCR
40.0000 meq | EXTENDED_RELEASE_TABLET | Freq: Once | ORAL | Status: AC
  Administered 2022-10-18: 40 meq via ORAL
  Filled 2022-10-18: qty 2

## 2022-10-18 NOTE — ED Triage Notes (Signed)
Pt with c/o R sided chest pain that started today.

## 2022-10-18 NOTE — Discharge Instructions (Addendum)
Thank you for allowing me to be part of your care today.  I am referring you to cardiology for follow-up given your history and presentation of chest pain today.  Your workup here in the ED was overall reassuring, your heart enzymes were negative, and your chest x-ray was normal.  I also recommend following up with your primary care provider.  I recommend taking 1000 mg of Tylenol every 6 hours as needed for chest pain.  Do not exceed 4000 mg in a 24-hour period.  Return to the ER if you have worsening of your symptoms or if you have any new concerns.

## 2022-10-18 NOTE — ED Provider Notes (Signed)
[facility] Provider Note   CSN: [id number] Arrival date & time: 10/18/22  2028     History  Chief Complaint  Patient presents with   Chest Pain    Kendra Valenzuela is a 62 y.o. female with history significant for hypertension, left heart cath, GERD presents to the ED complaining of right-sided chest pain that started earlier today.  Patient states that she was at rest in bed when the pain started and is worse with moving and with breathing.  She describes as a constant sharp chest pain.  She states it is significantly worse when she is up and moving around.  She has mild associated shortness of breath with it.  Denies fever, cough, chest tightness, palpitations, leg swelling, nausea, vomiting.  She reports no prior history of a DVT or PE.       Home Medications Prior to Admission medications   Medication Sig Start Date End Date Taking? Authorizing Provider  acetaminophen (TYLENOL) 325 MG tablet Take 650 mg by mouth every 6 (six) hours as needed.    [provider]  amLODipine (NORVASC) 2.5 MG tablet Take 1 tablet (2.5 mg total) by mouth daily. Patient not taking: Reported on 04/18/2020 10/18/19 01/16/20  Fay Records, MD  aspirin EC 81 MG tablet Take 81 mg by mouth daily.    [provider]  cholecalciferol (VITAMIN D) 1000 units tablet Take 1,000 Units by mouth daily.    [provider]  diclofenac sodium (VOLTAREN) 1 % GEL Apply 2 g topically 4 (four) times daily. [id number]   Delora Fuel, MD  diphenhydrAMINE (BENADRYL) 25 MG tablet Take 50 mg by mouth 2 (two) times daily as needed for allergies.    [provider]  HYDROcodone-acetaminophen (NORCO/VICODIN) 5-325 MG tablet Take 1 tablet by mouth every 6 (six) hours as needed for severe pain. 01/29/22   Evalee Jefferson, PA-C  lidocaine (LIDODERM) 5 % Place 1 patch onto the skin daily. Remove & Discard patch within 12 hours or as directed by MD 01/29/22   Evalee Jefferson, PA-C  lisinopril-hydrochlorothiazide (PRINZIDE,ZESTORETIC) 20-12.5 MG tablet Take 1 tablet by mouth daily.  01/29/18   [provider]  naproxen (NAPROSYN) 500 MG tablet Take 1 tablet by mouth 2 (two) times daily. 07/16/18   [provider]  potassium chloride SA (K-DUR,KLOR-CON) 20 MEQ tablet Take 1 tablet (20 mEq total) by mouth daily. [id number]   Delora Fuel, MD  rosuvastatin (CRESTOR) 10 MG tablet Take 10 mg by mouth daily.    [provider]      Allergies    Patient has no known allergies.    Review of Systems   Review of Systems  Constitutional:  Negative for fever.  Respiratory:  Positive for shortness of breath (With exertion). Negative for chest tightness and wheezing.   Cardiovascular:  Positive for chest pain. Negative for palpitations and leg swelling.  Gastrointestinal:  Negative for nausea and vomiting.  Neurological:  Negative for dizziness, syncope, weakness and light-headedness.    Physical Exam Updated Vital Signs BP 135/84   Pulse 80   Temp 98.3 F (36.8 C) (Oral)   Resp (!) 21   Ht 5' 6"$  (1.676 m)   Wt 81.2 kg   SpO2 97%   BMI 28.89 kg/m  Physical Exam Vitals and nursing note reviewed. Exam conducted with a chaperone present.  Constitutional:      General: She is not in acute distress.  Appearance: She is not ill-appearing.  HENT:     Mouth/Throat:     Mouth: Mucous membranes are moist.     Pharynx: Oropharynx is clear.  Cardiovascular:     Rate and Rhythm: Normal rate and regular rhythm.     Pulses: Normal pulses.     Heart sounds: Normal heart sounds.  Pulmonary:     Effort: Pulmonary effort is normal. No tachypnea or respiratory distress.     Breath sounds: Normal breath sounds and air entry.  Chest:     Chest wall: Tenderness present.       Comments: There is a pinpoint area of tenderness on palpation of the right anterior chest wall on the lateral aspect. Abdominal:     General: Abdomen is flat. There is  no distension.     Palpations: Abdomen is soft.     Tenderness: There is no abdominal tenderness.  Musculoskeletal:       Back:     Right lower leg: No edema.     Left lower leg: No edema.     Comments: Tenderness along the right side of the upper back which correlates with patient's anterior chest wall pain  Skin:    General: Skin is warm and dry.     Capillary Refill: Capillary refill takes less than 2 seconds.  Neurological:     Mental Status: She is alert. Mental status is at baseline.  Psychiatric:        Mood and Affect: Mood normal.        Behavior: Behavior normal.     ED Results / Procedures / Treatments   Labs (all labs ordered are listed, but only abnormal results are displayed) Labs Reviewed  BASIC METABOLIC PANEL - Abnormal; Notable for the following components:      Result Value   Potassium 3.1 (*)    Glucose, Bld 125 (*)    All other components within normal limits  CBC - Abnormal; Notable for the following components:   Hemoglobin 10.5 (*)    HCT 34.2 (*)    All other components within normal limits  D-DIMER, QUANTITATIVE - Abnormal; Notable for the following components:   D-Dimer, Quant 0.62 (*)    All other components within normal limits  TROPONIN I (HIGH SENSITIVITY)  TROPONIN I (HIGH SENSITIVITY)    EKG EKG Interpretation  Date/Time:  Friday October 18 2022 20:32:42 EST Ventricular Rate:  87 PR Interval:  133 QRS Duration: 122 QT Interval:  375 QTC Calculation: 452 R Axis:   27 Text Interpretation: Sinus rhythm Nonspecific intraventricular conduction delay since last tracing no significant change Confirmed by Noemi Chapel [phone]) on 10/18/2022 8:37:47 PM  Radiology DG Chest Portable 1 View  Result Date: 10/18/2022 CLINICAL DATA:  Chest pain EXAM: PORTABLE CHEST 1 VIEW COMPARISON:  10/04/2019 FINDINGS: Heart and mediastinal contours are within normal limits. No focal opacities or effusions. No acute bony abnormality. IMPRESSION: No active  disease. Electronically Signed   By: Rolm Baptise M.D.   On: 10/18/2022 21:08    Procedures Procedures    Medications Ordered in ED Medications  morphine (PF) 4 MG/ML injection 4 mg (4 mg Intravenous Given 10/18/22 2215)  potassium chloride SA (KLOR-CON M) CR tablet 40 mEq (40 mEq Oral Given 10/18/22 2215)    ED Course/ Medical Decision Making/ A&P                             Medical  Decision Making Amount and/or Complexity of Data Reviewed Labs: ordered. Radiology: ordered.  Risk Prescription drug management.   This patient presents to the ED with chief complaint(s) of right sided chest pain with pertinent past medical history of hypertension, left heart cath, GERD, hypercholesterolemia.  The complaint involves an extensive differential diagnosis and also carries with it a high risk of complications and morbidity.    The differential diagnosis includes ACS, PE, musculoskeletal pain, costochondritis, GERD; infectious process such as pneumonia less likely given patient does not have associated cough, fever, or other lower respiratory symptoms    The initial plan is to obtain chest pain work up  Additional history obtained: Additional history obtained from  none Records reviewed  none  Initial Assessment:   On exam, patient is lying in bed and does appear to be somewhat uncomfortable, but she is not in acute distress.  Lungs are clear to auscultation bilaterally.  She does have mildly elevated respiratory rate in the low 20s.  Heart rate is normal around 80 with a regular rhythm.  Chest pain is reproducible with palpation of the right anterior chest wall.  She also reports increased pain when she takes a deep breath.  Abdomen is soft and nontender.  No evidence of bilateral low pedal edema.  Skin is warm and dry.  She also has reproducible pain palpating the right side of the back along the scapula.  Independent ECG/labs interpretation:  The following labs were independently  interpreted:  CBC without evidence of leukocytosis, there is mild anemia with a hemoglobin of 10.5.  This appears to be similar to patient's prior hemoglobins likely her baseline.  Metabolic panel with hypokalemia, no other electrolyte disturbances.  Renal function is preserved. Troponin is negative.  D-dimer is 0.62, however, age-adjusted makes PE unlikely.  Independent visualization and interpretation of imaging: I independently visualized the following imaging with scope of interpretation limited to determining acute life threatening conditions related to emergency care: Chest x-ray, which revealed no evidence of active cardiopulmonary disease, no infiltrates to suggest pulmonary edema or pneumonia.  No pneumothorax.  I agree with radiologist interpretation.  Treatment and Reassessment: Patient's pain was treated with IV morphine with improvement.  Also gave patient oral potassium given that she does have hypokalemia here in ED.  Patient has reproducible chest pain, with an age-adjusted D-dimer that makes VTE unlikely.  She is normotensive and appears hemodynamically stable.  She appears to be resting fairly comfortably upon reassessment.  Will recommend patient follow-up with primary care provider as well as cardiology.  I do not feel that she requires further workup or hospitalization at this time.  Discussed use of Tylenol to help with pain at home.  Disposition:   The patient has been appropriately medically screened and/or stabilized in the ED. I have low suspicion for any other emergent medical condition which would require further screening, evaluation or treatment in the ED or require inpatient management. At time of discharge the patient is hemodynamically stable and in no acute distress. I have discussed work-up results and diagnosis with patient and answered all questions. Patient is agreeable with discharge plan. We discussed strict return precautions for returning to the emergency  department and they verbalized understanding.            Final Clinical Impression(s) / ED Diagnoses Final diagnoses:  Chest pain, unspecified type    Rx / DC Orders ED Discharge Orders     None         Geran Haithcock,  Shonna Chock, PA 10/18/22 2305    Noemi Chapel, MD 10/20/22 Bosie Helper

## 2022-10-25 DIAGNOSIS — M940 Chondrocostal junction syndrome [Tietze]: Secondary | ICD-10-CM | POA: Diagnosis not present

## 2022-11-07 ENCOUNTER — Encounter: Payer: Self-pay | Admitting: Radiology

## 2022-12-19 ENCOUNTER — Ambulatory Visit: Payer: BC Managed Care – PPO | Attending: Internal Medicine | Admitting: Internal Medicine

## 2022-12-19 ENCOUNTER — Encounter: Payer: Self-pay | Admitting: Internal Medicine

## 2022-12-19 VITALS — BP 124/74 | HR 84 | Ht 66.0 in | Wt 181.0 lb

## 2022-12-19 DIAGNOSIS — E7849 Other hyperlipidemia: Secondary | ICD-10-CM | POA: Diagnosis not present

## 2022-12-19 DIAGNOSIS — R0789 Other chest pain: Secondary | ICD-10-CM

## 2022-12-19 DIAGNOSIS — I10 Essential (primary) hypertension: Secondary | ICD-10-CM | POA: Diagnosis not present

## 2022-12-19 NOTE — Patient Instructions (Signed)
Medication Instructions:  Your physician recommends that you continue on your current medications as directed. Please refer to the Current Medication list given to you today.  *If you need a refill on your cardiac medications before your next appointment, please call your pharmacy*   Lab Work: None If you have labs (blood work) drawn today and your tests are completely normal, you will receive your results only by: MyChart Message (if you have MyChart) OR A paper copy in the mail If you have any lab test that is abnormal or we need to change your treatment, we will call you to review the results.   Testing/Procedures: None   Follow-Up: At [facility], you and your health needs are our priority.  As part of our continuing mission to provide you with exceptional heart care, we have created designated Provider Care Teams.  These Care Teams include your primary Cardiologist (physician) and Advanced Practice Providers (APPs -  Physician Assistants and Nurse Practitioners) who all work together to provide you with the care you need, when you need it.  We recommend signing up for the patient portal called "MyChart".  Sign up information is provided on this After Visit Summary.  MyChart is used to connect with patients for Virtual Visits (Telemedicine).  Patients are able to view lab/test results, encounter notes, upcoming appointments, etc.  Non-urgent messages can be sent to your provider as well.   To learn more about what you can do with MyChart, go to [email or web address].    Your next appointment:   Follow up as needed.    Provider:   Vishnu Mallipeddi, MD    Other Instructions    

## 2022-12-19 NOTE — Progress Notes (Addendum)
Cardiology Office Note  Date: 12/19/2022   ID: Kendra Valenzuela, DOB October 15, 1960, MRN [id number]  PCP:  Benita Stabile, MD  Cardiologist:  Marjo Bicker, MD Electrophysiologist:  None   Reason for Office Visit: Evaluation chest pain at the request of Chestine Spore, PA-C   History of Present Illness: Kendra Valenzuela is a 62 y.o. female known to have HTN, HLD was referred to cardiology clinic for evaluation of chest pain.  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  In 10/2022 after she had ER visit, she told her supervisor that she cannot lift him anymore every day after which the heavy man was moved to a different nursing home.  She did not have any recurrence of chest pain after he was placed in a different nursing home.  She can perform her household chores and does not have any angina or DOE.  Denies other symptoms of dizziness, lightheadedness, leg swelling palpitations and syncope.  Denies smoking cigarettes, and illicit drug abuse.  Patient had abnormal stress test in 2014 and underwent LHC that showed angiographic normal coronaries in 2014.   Past Medical History:  Diagnosis Date   Anxiety    Arthritis    Back pain    Chest pain 01/2013   Admitted to [facility] in 01/2013   Environmental allergies    GERD (gastroesophageal reflux disease)    Headache(784.0)    Hypercholesteremia    Hypertension 01/13/2013    Past Surgical History:  Procedure Laterality Date   CARDIAC CATHETERIZATION     CHOLECYSTECTOMY N/A 01/21/2014   Procedure: LAPAROSCOPIC CHOLECYSTECTOMY;  Surgeon: Dalia Heading, MD;  Location: [facility];  Service: General;  Laterality: N/A;   COLONOSCOPY WITH PROPOFOL N/A 06/17/2017   Procedure: COLONOSCOPY WITH PROPOFOL;  Surgeon: West Bali, MD;  Location: [facility] SU[facility];  Service: Endoscopy;  Laterality: N/A;  12:45pm    DILATION AND CURETTAGE OF UTERUS     LEFT HEART CATHETERIZATION WITH CORONARY ANGIOGRAM N/A 02/08/2013   Procedure: LEFT HEART CATHETERIZATION WITH CORONARY ANGIOGRAM;  Surgeon: Kathleene Hazel, MD;  Location: [facility] CATH LAB;  Service: Cardiovascular;  Laterality: N/A;   POLYPECTOMY  06/17/2017   Procedure: POLYPECTOMY;  Surgeon: West Bali, MD;  Location: [facility];  Service: Endoscopy;;  colon    UNILATERAL SALPINGECTOMY Right    due to ectopic pregnancy    Current Outpatient Medications  Medication Sig Dispense Refill   acetaminophen (TYLENOL) 325 MG tablet Take 650 mg by mouth every 6 (six) hours as needed.     aspirin EC 81 MG tablet Take 81 mg by mouth daily.     cholecalciferol (VITAMIN D) 1000 units tablet Take 1,000 Units by mouth daily.     diclofenac sodium (VOLTAREN) 1 % GEL Apply 2 g topically 4 (four) times daily. 100 g 0   diphenhydrAMINE (BENADRYL) 25 MG tablet Take 50 mg by mouth 2 (two) times daily as needed for allergies.     HYDROcodone-acetaminophen (NORCO/VICODIN) 5-325 MG tablet Take 1 tablet by mouth every 6 (six) hours as needed for severe pain. 20 tablet 0   lidocaine (LIDODERM) 5 % Place 1 patch onto the skin daily. Remove & Discard patch within 12 hours or as directed by MD 10 patch 0   lisinopril-hydrochlorothiazide (PRINZIDE,ZESTORETIC) 20-12.5 MG tablet Take 1 tablet by  mouth daily.   4   rosuvastatin (CRESTOR) 10 MG tablet Take 10 mg by mouth daily.     traZODone (DESYREL) 50 MG tablet Take 50 mg by mouth at bedtime.     No current facility-administered medications for this visit.   Allergies:  Patient has no known allergies.   Social History: The patient  reports that she has never smoked. She has never used smokeless tobacco. She reports that she does not currently use alcohol after a past usage of about 14.0 standard drinks of alcohol per week. She reports that she does not use drugs.   Family History: The patient's family history includes  Hypertension in her father.   ROS:  Please see the history of present illness. Otherwise, complete review of systems is positive for none.  All other systems are reviewed and negative.   Physical Exam: VS:  BP 124/74   Pulse 84   Ht 5\' 6"  (1.676 m)   Wt 181 lb (82.1 kg)   SpO2 99%   BMI 29.21 kg/m , BMI Body mass index is 29.21 kg/m.  Wt Readings from Last 3 Encounters:  12/19/22 181 lb (82.1 kg)  10/18/22 179 lb (81.2 kg)  01/29/22 172 lb (78 kg)    General: Patient appears comfortable at rest. HEENT: Conjunctiva and lids normal, oropharynx clear with moist mucosa. Neck: Supple, no elevated JVP or carotid bruits, no thyromegaly. Lungs: Clear to auscultation, nonlabored breathing at rest. Cardiac: Regular rate and rhythm, no S3 or significant systolic murmur, no pericardial rub. Abdomen: Soft, nontender, no hepatomegaly, bowel sounds present, no guarding or rebound. Extremities: No pitting edema, distal pulses 2+. Skin: Warm and dry. Musculoskeletal: No kyphosis. Neuropsychiatric: Alert and oriented x3, affect grossly appropriate.  ECG:  NSR  Recent Labwork: 10/18/2022: BUN 14; Creatinine, Ser 0.92; Hemoglobin 10.5; Platelets 331; Potassium 3.1; Sodium 141     Component Value Date/Time   CHOL 213 (H) 01/13/2013 1333   TRIG 221 (H) 01/13/2013 1333   HDL 65 01/13/2013 1333   CHOLHDL 3.3 01/13/2013 1333   VLDL 44 (H) 01/13/2013 1333   LDLCALC 104 (H) 01/13/2013 1333    Assessment and Plan: Patient is a 62 year old F known to have HTN, HLD was referred to cardiology clinic for evaluation chest pain.  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 She had abnormal stress test in 2014 for which LHC was performed that showed angiographically normal coronaries. # HTN,  controlled: Continue lisinopril-HCTZ 20-12.5 mg once daily, HTN per PCP. # HLD: Continue rosuvastatin 10 mg nightly, HLD per PCP.  I have spent a total of 30 minutes with patient reviewing chart, EKGs, labs and examining patient as well as establishing an assessment and plan that was discussed with the patient.  > 50% of time was spent in direct patient care.     Medication Adjustments/Labs and Tests Ordered: Current medicines are reviewed at length with the patient today.  Concerns regarding medicines are outlined above.   Tests Ordered: No orders of the defined types were placed in this encounter.   Medication Changes: No orders of the defined types were placed in this encounter.   Disposition:  Follow up prn  Signed, Akiko Schexnider Verne Spurr, MD, 12/19/2022 10:19 AM    [facility] [place]. [address], [place], [place] [address]

## 2023-01-03 DIAGNOSIS — E559 Vitamin D deficiency, unspecified: Secondary | ICD-10-CM | POA: Diagnosis not present

## 2023-01-03 DIAGNOSIS — I10 Essential (primary) hypertension: Secondary | ICD-10-CM | POA: Diagnosis not present

## 2023-01-03 DIAGNOSIS — E785 Hyperlipidemia, unspecified: Secondary | ICD-10-CM | POA: Diagnosis not present

## 2023-01-09 DIAGNOSIS — K219 Gastro-esophageal reflux disease without esophagitis: Secondary | ICD-10-CM | POA: Diagnosis not present

## 2023-01-09 DIAGNOSIS — G629 Polyneuropathy, unspecified: Secondary | ICD-10-CM | POA: Diagnosis not present

## 2023-01-09 DIAGNOSIS — E785 Hyperlipidemia, unspecified: Secondary | ICD-10-CM | POA: Diagnosis not present

## 2023-01-09 DIAGNOSIS — I10 Essential (primary) hypertension: Secondary | ICD-10-CM | POA: Diagnosis not present

## 2023-01-09 DIAGNOSIS — E559 Vitamin D deficiency, unspecified: Secondary | ICD-10-CM | POA: Diagnosis not present

## 2023-01-09 DIAGNOSIS — G894 Chronic pain syndrome: Secondary | ICD-10-CM | POA: Diagnosis not present

## 2023-01-09 DIAGNOSIS — Z0001 Encounter for general adult medical examination with abnormal findings: Secondary | ICD-10-CM | POA: Diagnosis not present

## 2023-04-12 DIAGNOSIS — G629 Polyneuropathy, unspecified: Secondary | ICD-10-CM | POA: Diagnosis not present

## 2023-04-12 DIAGNOSIS — E785 Hyperlipidemia, unspecified: Secondary | ICD-10-CM | POA: Diagnosis not present

## 2023-04-12 DIAGNOSIS — I10 Essential (primary) hypertension: Secondary | ICD-10-CM | POA: Diagnosis not present

## 2023-04-12 DIAGNOSIS — G4700 Insomnia, unspecified: Secondary | ICD-10-CM | POA: Diagnosis not present

## 2023-07-11 DIAGNOSIS — I10 Essential (primary) hypertension: Secondary | ICD-10-CM | POA: Diagnosis not present

## 2023-07-11 DIAGNOSIS — E785 Hyperlipidemia, unspecified: Secondary | ICD-10-CM | POA: Diagnosis not present

## 2023-07-11 DIAGNOSIS — E559 Vitamin D deficiency, unspecified: Secondary | ICD-10-CM | POA: Diagnosis not present

## 2023-07-18 ENCOUNTER — Other Ambulatory Visit (HOSPITAL_COMMUNITY): Payer: Self-pay | Admitting: Family Medicine

## 2023-07-18 DIAGNOSIS — I10 Essential (primary) hypertension: Secondary | ICD-10-CM | POA: Diagnosis not present

## 2023-07-18 DIAGNOSIS — J069 Acute upper respiratory infection, unspecified: Secondary | ICD-10-CM | POA: Diagnosis not present

## 2023-07-18 DIAGNOSIS — R7303 Prediabetes: Secondary | ICD-10-CM | POA: Diagnosis not present

## 2023-07-18 DIAGNOSIS — K219 Gastro-esophageal reflux disease without esophagitis: Secondary | ICD-10-CM | POA: Diagnosis not present

## 2023-07-18 DIAGNOSIS — D649 Anemia, unspecified: Secondary | ICD-10-CM | POA: Diagnosis not present

## 2023-07-18 DIAGNOSIS — Z713 Dietary counseling and surveillance: Secondary | ICD-10-CM | POA: Diagnosis not present

## 2023-07-18 DIAGNOSIS — M25552 Pain in left hip: Secondary | ICD-10-CM | POA: Diagnosis not present

## 2023-07-18 DIAGNOSIS — E559 Vitamin D deficiency, unspecified: Secondary | ICD-10-CM | POA: Diagnosis not present

## 2023-07-18 DIAGNOSIS — F5104 Psychophysiologic insomnia: Secondary | ICD-10-CM | POA: Diagnosis not present

## 2023-07-18 DIAGNOSIS — G894 Chronic pain syndrome: Secondary | ICD-10-CM | POA: Diagnosis not present

## 2023-07-18 DIAGNOSIS — Z1231 Encounter for screening mammogram for malignant neoplasm of breast: Secondary | ICD-10-CM

## 2023-07-18 DIAGNOSIS — G629 Polyneuropathy, unspecified: Secondary | ICD-10-CM | POA: Diagnosis not present

## 2023-07-18 DIAGNOSIS — E785 Hyperlipidemia, unspecified: Secondary | ICD-10-CM | POA: Diagnosis not present

## 2023-11-28 ENCOUNTER — Ambulatory Visit (HOSPITAL_COMMUNITY)
Admission: RE | Admit: 2023-11-28 | Discharge: 2023-11-28 | Disposition: A | Discharge status: Home / Self Care | Payer: Self-pay | Source: Ambulatory Visit | Attending: Family Medicine | Admitting: Family Medicine

## 2023-11-28 DIAGNOSIS — Z1231 Encounter for screening mammogram for malignant neoplasm of breast: Secondary | ICD-10-CM | POA: Insufficient documentation

## 2024-01-10 ENCOUNTER — Encounter (HOSPITAL_COMMUNITY): Payer: Self-pay

## 2024-01-10 ENCOUNTER — Other Ambulatory Visit: Payer: Self-pay

## 2024-01-10 ENCOUNTER — Emergency Department (HOSPITAL_COMMUNITY): Payer: Self-pay

## 2024-01-10 ENCOUNTER — Observation Stay (HOSPITAL_COMMUNITY)
Admission: EM | Admit: 2024-01-10 | Discharge: 2024-01-12 | Disposition: A | Discharge status: Home / Self Care | Payer: Self-pay | Attending: Family Medicine | Admitting: Family Medicine

## 2024-01-10 DIAGNOSIS — I5030 Unspecified diastolic (congestive) heart failure: Secondary | ICD-10-CM | POA: Insufficient documentation

## 2024-01-10 DIAGNOSIS — Z7982 Long term (current) use of aspirin: Secondary | ICD-10-CM | POA: Insufficient documentation

## 2024-01-10 DIAGNOSIS — E782 Mixed hyperlipidemia: Secondary | ICD-10-CM | POA: Diagnosis present

## 2024-01-10 DIAGNOSIS — I10 Essential (primary) hypertension: Secondary | ICD-10-CM | POA: Diagnosis present

## 2024-01-10 DIAGNOSIS — F4489 Other dissociative and conversion disorders: Secondary | ICD-10-CM

## 2024-01-10 DIAGNOSIS — R55 Syncope and collapse: Secondary | ICD-10-CM | POA: Insufficient documentation

## 2024-01-10 DIAGNOSIS — I110 Hypertensive heart disease with heart failure: Secondary | ICD-10-CM | POA: Insufficient documentation

## 2024-01-10 DIAGNOSIS — D649 Anemia, unspecified: Secondary | ICD-10-CM | POA: Insufficient documentation

## 2024-01-10 DIAGNOSIS — E871 Hypo-osmolality and hyponatremia: Secondary | ICD-10-CM | POA: Insufficient documentation

## 2024-01-10 DIAGNOSIS — R2689 Other abnormalities of gait and mobility: Secondary | ICD-10-CM | POA: Insufficient documentation

## 2024-01-10 DIAGNOSIS — R739 Hyperglycemia, unspecified: Secondary | ICD-10-CM | POA: Diagnosis present

## 2024-01-10 DIAGNOSIS — E876 Hypokalemia: Secondary | ICD-10-CM | POA: Diagnosis present

## 2024-01-10 DIAGNOSIS — G9340 Encephalopathy, unspecified: Secondary | ICD-10-CM

## 2024-01-10 DIAGNOSIS — R4182 Altered mental status, unspecified: Principal | ICD-10-CM

## 2024-01-10 DIAGNOSIS — Z79899 Other long term (current) drug therapy: Secondary | ICD-10-CM | POA: Insufficient documentation

## 2024-01-10 DIAGNOSIS — R29818 Other symptoms and signs involving the nervous system: Principal | ICD-10-CM | POA: Diagnosis present

## 2024-01-10 LAB — DIFFERENTIAL
Abs Immature Granulocytes: 0.02 10*3/uL (ref 0.00–0.07)
Basophils Absolute: 0.1 10*3/uL (ref 0.0–0.1)
Basophils Relative: 1 %
Eosinophils Absolute: 0.3 10*3/uL (ref 0.0–0.5)
Eosinophils Relative: 3 %
Immature Granulocytes: 0 %
Lymphocytes Relative: 46 %
Lymphs Abs: 4.7 10*3/uL — ABNORMAL HIGH (ref 0.7–4.0)
Monocytes Absolute: 0.7 10*3/uL (ref 0.1–1.0)
Monocytes Relative: 7 %
Neutro Abs: 4.3 10*3/uL (ref 1.7–7.7)
Neutrophils Relative %: 43 %

## 2024-01-10 LAB — CBC
HCT: 32.5 % — ABNORMAL LOW (ref 36.0–46.0)
Hemoglobin: 10.6 g/dL — ABNORMAL LOW (ref 12.0–15.0)
MCH: 28.4 pg (ref 26.0–34.0)
MCHC: 32.6 g/dL (ref 30.0–36.0)
MCV: 87.1 fL (ref 80.0–100.0)
Platelets: 302 10*3/uL (ref 150–400)
RBC: 3.73 MIL/uL — ABNORMAL LOW (ref 3.87–5.11)
RDW: 14.3 % (ref 11.5–15.5)
WBC: 9.9 10*3/uL (ref 4.0–10.5)
nRBC: 0 % (ref 0.0–0.2)

## 2024-01-10 LAB — COMPREHENSIVE METABOLIC PANEL WITH GFR
ALT: 11 U/L (ref 0–44)
AST: 16 U/L (ref 15–41)
Albumin: 3.8 g/dL (ref 3.5–5.0)
Alkaline Phosphatase: 107 U/L (ref 38–126)
Anion gap: 11 (ref 5–15)
BUN: 8 mg/dL (ref 8–23)
CO2: 24 mmol/L (ref 22–32)
Calcium: 8.7 mg/dL — ABNORMAL LOW (ref 8.9–10.3)
Chloride: 100 mmol/L (ref 98–111)
Creatinine, Ser: 0.78 mg/dL (ref 0.44–1.00)
GFR, Estimated: >60 mL/min (ref >60)
Glucose, Bld: 162 mg/dL — ABNORMAL HIGH (ref 70–99)
Potassium: 2.9 mmol/L — ABNORMAL LOW (ref 3.5–5.1)
Sodium: 135 mmol/L (ref 135–145)
Total Bilirubin: 0.6 mg/dL (ref 0.0–1.2)
Total Protein: 7.1 g/dL (ref 6.5–8.1)

## 2024-01-10 LAB — PROTIME-INR
INR: 1 (ref 0.8–1.2)
Prothrombin Time: 13.3 s (ref 11.4–15.2)

## 2024-01-10 LAB — ETHANOL: Alcohol, Ethyl (B): <15 mg/dL (ref <15)

## 2024-01-10 LAB — CBG MONITORING, ED: Glucose-Capillary: 128 mg/dL — ABNORMAL HIGH (ref 70–99)

## 2024-01-10 LAB — APTT: aPTT: 26 s (ref 24–36)

## 2024-01-10 MED ORDER — POTASSIUM CHLORIDE 20 MEQ PO PACK
40.0000 meq | PACK | Freq: Once | ORAL | Status: AC
  Administered 2024-01-10: 40 meq via ORAL
  Filled 2024-01-10: qty 2

## 2024-01-10 MED ORDER — IOHEXOL 350 MG/ML SOLN: 100.0000 mL | Freq: Once | INTRAVENOUS | Status: DC | PRN

## 2024-01-10 NOTE — ED Triage Notes (Signed)
 EMS came in code stroke, left sided facial droop. Brother called 911 after seeing pt pacing floor, sat down and went non-verbal. Initial BP 80s systolic, placed in trendelenburg, systolic up to 120s on arrival. Pt now verbal, weakness in bilateral legs when NIH performed. History of strokes, takes aspirin  81mg  daily.

## 2024-01-10 NOTE — Progress Notes (Addendum)
 1948: Code stroke cart activated at this time. 1953: Patient left for CT.  1948: Neurology paged at this time.  2004: Patient returned from CT.  1951: Dr. Cleone Dad on camera at this time. Patient report and history provided to neurologist.  2012: NCCT imaging results provided to [facility] on camera.

## 2024-01-10 NOTE — ED Provider Notes (Addendum)
 [place] EMERGENCY DEPARTMENT AT [facility] Provider Note   CSN: [id number] Arrival date & time: 01/10/24  1947  An emergency department physician performed an initial assessment on this suspected stroke patient at 63.  History  Chief Complaint  Patient presents with  . Code Stroke    Kendra RINIKER is a 63 y.o. female.  Patient presents by ems as 'code stroke' activation. Per report, patient had been pacing back and forth at home, acting confused, then sat down and became not verbally responsive, ?facial drooping. Pt not responding verbally to questions - level 5 caveat. No report of fevers. No report of trauma/fall. No report of change in meds. On report of etoh or substance use.   The history is provided by the patient, medical records and the EMS personnel. The history is limited by the condition of the patient.       Home Medications Prior to Admission medications   Medication Sig Start Date End Date Taking? Authorizing Provider  acetaminophen  (TYLENOL ) 325 MG tablet Take 650 mg by mouth every 6 (six) hours as needed.    [provider]  aspirin  EC 81 MG tablet Take 81 mg by mouth daily.    [provider]  cholecalciferol (VITAMIN D) 1000 units tablet Take 1,000 Units by mouth daily.    [provider]  diclofenac  sodium (VOLTAREN ) 1 % GEL Apply 2 g topically 4 (four) times daily. 05/30/18   Alissa April, MD  diphenhydrAMINE (BENADRYL) 25 MG tablet Take 50 mg by mouth 2 (two) times daily as needed for allergies.    [provider]  HYDROcodone -acetaminophen  (NORCO/VICODIN) 5-325 MG tablet Take 1 tablet by mouth every 6 (six) hours as needed for severe pain. 01/29/22   Idol, Julie, PA-C  lidocaine  (LIDODERM ) 5 % Place 1 patch onto the skin daily. Remove & Discard patch within 12 hours or as directed by MD 01/29/22   Idol, Julie, PA-C  lisinopril-hydrochlorothiazide (PRINZIDE,ZESTORETIC) 20-12.5 MG tablet Take 1 tablet by mouth  daily.  01/29/18   [provider]  rosuvastatin (CRESTOR) 10 MG tablet Take 10 mg by mouth daily.    [provider]  traZODone (DESYREL) 50 MG tablet Take 50 mg by mouth at bedtime. 10/16/22   [provider]      Allergies    Patient has no known allergies.    Review of Systems   Review of Systems  Unable to perform ROS: Patient nonverbal  Constitutional:  Negative for fever.    Physical Exam Updated Vital Signs BP 135/80   Pulse 90   Temp 98.3 F (36.8 C) (Oral)   Resp (!) 26   Ht 1.676 m (5\' 6" )   SpO2 99%   BMI 29.21 kg/m  Physical Exam Vitals and nursing note reviewed.  Constitutional:      Appearance: Normal appearance. She is well-developed.  HENT:     Head: Atraumatic.     Nose: Nose normal.     Mouth/Throat:     Mouth: Mucous membranes are moist.  Eyes:     General: No scleral icterus.    Extraocular Movements: Extraocular movements intact.     Conjunctiva/sclera: Conjunctivae normal.     Pupils: Pupils are equal, round, and reactive to light.  Neck:     Trachea: No tracheal deviation.     Comments: Trachea midline. Thyroid not grossly enlarged or tender. No neck stiffness or rigidity.  Cardiovascular:     Rate and Rhythm: Normal rate and  regular rhythm.     Pulses: Normal pulses.     Heart sounds: Normal heart sounds. No murmur heard.    No friction rub. No gallop.  Pulmonary:     Effort: Pulmonary effort is normal. No respiratory distress.     Breath sounds: Normal breath sounds.  Abdominal:     General: Bowel sounds are normal. There is no distension.     Palpations: Abdomen is soft. There is no mass.     Tenderness: There is no abdominal tenderness. There is no guarding.  Genitourinary:    Comments: No cva tenderness.  Musculoskeletal:        General: No swelling.     Cervical back: Normal range of motion and neck supple. No rigidity. No muscular tenderness.     Comments: CTLS spine, non tender, aligned, no step  off. Good passive rom bil extremities without pain, distal pulses palp bil.   Skin:    General: Skin is warm and dry.     Findings: No rash.  Neurological:     Mental Status: She is alert.     Comments: Awake, alert appearing. Eyes open. No tremor. No seizure activity. Does not respond verbally to question, no apparent effort to respond to questions. Inconsistent weakness on extremity exam, initially grips with bil hands, and holds up arms for a second but then drops arms and doesn't grip.     ED Results / Procedures / Treatments   Labs (all labs ordered are listed, but only abnormal results are displayed) Results for orders placed or performed during the hospital encounter of 01/10/24  Ethanol   Collection Time: 01/10/24  7:50 PM  Result Value Ref Range   Alcohol, Ethyl (B) <15 <15 mg/dL  Protime-INR   Collection Time: 01/10/24  7:50 PM  Result Value Ref Range   Prothrombin Time 13.3 11.4 - 15.2 seconds   INR 1.0 0.8 - 1.2  APTT   Collection Time: 01/10/24  7:50 PM  Result Value Ref Range   aPTT 26 24 - 36 seconds  CBC   Collection Time: 01/10/24  7:50 PM  Result Value Ref Range   WBC 9.9 4.0 - 10.5 K/uL   RBC 3.73 (L) 3.87 - 5.11 MIL/uL   Hemoglobin 10.6 (L) 12.0 - 15.0 g/dL   HCT 16.1 (L) 09.6 - 04.5 %   MCV 87.1 80.0 - 100.0 fL   MCH 28.4 26.0 - 34.0 pg   MCHC 32.6 30.0 - 36.0 g/dL   RDW 40.9 81.1 - 91.4 %   Platelets 302 150 - 400 K/uL   nRBC 0.0 0.0 - 0.2 %  Differential   Collection Time: 01/10/24  7:50 PM  Result Value Ref Range   Neutrophils Relative % 43 %   Neutro Abs 4.3 1.7 - 7.7 K/uL   Lymphocytes Relative 46 %   Lymphs Abs 4.7 (H) 0.7 - 4.0 K/uL   Monocytes Relative 7 %   Monocytes Absolute 0.7 0.1 - 1.0 K/uL   Eosinophils Relative 3 %   Eosinophils Absolute 0.3 0.0 - 0.5 K/uL   Basophils Relative 1 %   Basophils Absolute 0.1 0.0 - 0.1 K/uL   Immature Granulocytes 0 %   Abs Immature Granulocytes 0.02 0.00 - 0.07 K/uL  Comprehensive metabolic panel    Collection Time: 01/10/24  7:50 PM  Result Value Ref Range   Sodium 135 135 - 145 mmol/L   Potassium 2.9 (L) 3.5 - 5.1 mmol/L   Chloride 100 98 - 111  mmol/L   CO2 24 22 - 32 mmol/L   Glucose, Bld 162 (H) 70 - 99 mg/dL   BUN 8 8 - 23 mg/dL   Creatinine, Ser 7.16 0.44 - 1.00 mg/dL   Calcium 8.7 (L) 8.9 - 10.3 mg/dL   Total Protein 7.1 6.5 - 8.1 g/dL   Albumin 3.8 3.5 - 5.0 g/dL   AST 16 15 - 41 U/L   ALT 11 0 - 44 U/L   Alkaline Phosphatase 107 38 - 126 U/L   Total Bilirubin 0.6 0.0 - 1.2 mg/dL   GFR, Estimated >96 >78 mL/min   Anion gap 11 5 - 15  CBG monitoring, ED   Collection Time: 01/10/24  8:10 PM  Result Value Ref Range   Glucose-Capillary 128 (H) 70 - 99 mg/dL   CT HEAD CODE STROKE WO CONTRAST Result Date: 01/10/2024 CLINICAL DATA:  Code stroke.  Neuro deficit, acute, stroke suspected EXAM: CT HEAD WITHOUT CONTRAST TECHNIQUE: Contiguous axial images were obtained from the base of the skull through the vertex without intravenous contrast. RADIATION DOSE REDUCTION: This exam was performed according to the departmental dose-optimization program which includes automated exposure control, adjustment of the mA and/or kV according to patient size and/or use of iterative reconstruction technique. COMPARISON:  CT head July 25, 2018. FINDINGS: Brain: No evidence of acute infarction, hemorrhage, hydrocephalus, extra-axial collection or mass lesion/mass effect. Vascular: No hyperdense vessel identified. Skull: No acute fracture. Sinuses/Orbits: Clear sinuses.  No acute orbital findings. Other: No mastoid effusions. ASPECTS [facility] Stroke Program Early CT Score) Total score (0-10 with 10 being normal): 10. IMPRESSION: No evidence of acute intracranial abnormality. ASPECTS is 10. Code stroke imaging results were communicated on 01/10/2024 at 8:08 pm to provider Dr. Moses Arenas via telephone, who verbally acknowledged these results. Electronically Signed   By: Stevenson Elbe M.D.   On: 01/10/2024  20:09     EKG EKG Interpretation Date/Time:  Saturday Jan 10 2024 20:17:24 EDT Ventricular Rate:  85 PR Interval:  180 QRS Duration:  127 QT Interval:  400 QTC Calculation: 476 R Axis:   33  Text Interpretation: Sinus rhythm Non-specific ST-t changes Confirmed by Guadalupe Lee ([id number]) on 01/10/2024 8:22:00 PM  Radiology CT HEAD CODE STROKE WO CONTRAST Result Date: 01/10/2024 CLINICAL DATA:  Code stroke.  Neuro deficit, acute, stroke suspected EXAM: CT HEAD WITHOUT CONTRAST TECHNIQUE: Contiguous axial images were obtained from the base of the skull through the vertex without intravenous contrast. RADIATION DOSE REDUCTION: This exam was performed according to the departmental dose-optimization program which includes automated exposure control, adjustment of the mA and/or kV according to patient size and/or use of iterative reconstruction technique. COMPARISON:  CT head July 25, 2018. FINDINGS: Brain: No evidence of acute infarction, hemorrhage, hydrocephalus, extra-axial collection or mass lesion/mass effect. Vascular: No hyperdense vessel identified. Skull: No acute fracture. Sinuses/Orbits: Clear sinuses.  No acute orbital findings. Other: No mastoid effusions. ASPECTS [facility] Stroke Program Early CT Score) Total score (0-10 with 10 being normal): 10. IMPRESSION: No evidence of acute intracranial abnormality. ASPECTS is 10. Code stroke imaging results were communicated on 01/10/2024 at 8:08 pm to provider Dr. Moses Arenas via telephone, who verbally acknowledged these results. Electronically Signed   By: Stevenson Elbe M.D.   On: 01/10/2024 20:09    Procedures Procedures    Medications Ordered in ED Medications  iohexol  (OMNIPAQUE ) 350 MG/ML injection 100 mL (has no administration in time range)  potassium chloride  (KLOR-CON ) packet 40 mEq (40 mEq Oral Given 01/10/24 2142)  ED Course/ Medical Decision Making/ A&P                                 Medical Decision Making Problems  Addressed: Acute alteration in mental status: acute illness or injury with systemic symptoms that poses a threat to life or bodily functions Confusional state: acute illness or injury with systemic symptoms that poses a threat to life or bodily functions Encephalopathy, unspecified type: acute illness or injury with systemic symptoms Hypokalemia: acute illness or injury  Amount and/or Complexity of Data Reviewed Independent Historian: EMS    Details: Ems/family External Data Reviewed: notes. Labs: ordered. Decision-making details documented in ED Course. Radiology: ordered and independent interpretation performed. ECG/medicine tests: ordered and independent interpretation performed. Decision-making details documented in ED Course. Discussion of management or test interpretation with external provider(s): Neurology, medicine  Risk Prescription drug management. Decision regarding hospitalization.   Iv ns. Continuous pulse ox and cardiac monitoring. Labs ordered/sent. Imaging ordered.   Differential diagnosis includes cva, encephalopathy, metabolic disturbance, etc. Dispo decision including potential need for admission considered - will get labs and imaging and reassess.   Reviewed nursing notes and prior charts for additional history. External reports reviewed. Additional history from: EMS.   Code stroke activation - neurology stat consulted and assessed - indicates ct ok, no new for angio/perfusion study. Admit medicine. If symptoms fail to clear can get mri in AM, indicates prior hx confusion state without clear cause then - current presentation dose appear similar.   Cardiac monitor: sinus rhythm, rate 88.  Labs reviewed/interpreted by me - k mildly low. Kcl po.   CT reviewed/interpreted by me - no hem.   Recheck no focal neuro deficit on exam. Vitals normal.   Hospitalists consulted for admission.   2351, hospitalist call back pending - signed out to oncoming edp to facilitate  admission.        Final Clinical Impression(s) / ED Diagnoses Final diagnoses:  None    Rx / DC Orders ED Discharge Orders     None         Guadalupe Lee, MD 01/10/24 2351

## 2024-01-10 NOTE — Consult Note (Signed)
 [facility] Telemedicine Consult   Requesting Provider: Guadalupe Lee Consult Participants: Myself, EMS, bedside nurse, atrium nurse Location of the provider: [place], [place] Location of the patient: [facility]  This consult was provided via telemedicine with 2-way video and audio communication. The patient/family was informed that care would be provided in this way and agreed to receive care in this manner.    Chief Complaint: reduced responsiveness   HPI: This is a 63 year old woman with a past medical history significant for prior presentation for confusional episode (2014), hypertension, hyperlipidemia, prediabetes, chronic pain, psychosocial insomnia,  Caveat, history provided essentially entirely by EMS  They report that her brother noted that she stood up and was pacing around approximately 20 minutes before their arrival when she then suddenly sat down on the couch and was altered/slow to respond/not her normal self.  Family did not notice any facial droop but EMS felt that she had a left facial droop.  They also noted that her blood pressure was 82/40 initially, improved to 117/65 with Trendelenburg position.  No fluids were administered.  On evaluation she is not answering open-ended questions but will name objects appropriately and correctly state her month and age and follow commands.  She simply shakes her head when asked what happened today, how she is feeling, when she was last felt like her normal self etc.   No answer at either of the numbers in chart as emergency contacts and EMS reports family member there activated by using his life alert, not a personal phone  LKW: Unclear, unable to confirm with family Thrombolytic given?: No, exam not felt to be consistent with stroke and unable to confirm last known well  IR Thrombectomy? No, exam  Modified Rankin Scale: 0-Completely asymptomatic and back to baseline post- stroke Time of teleneurologist evaluation: 7:50  PM  Exam: Vitals:   01/10/24 2010 01/10/24 2011  BP: (!) 126/103   Pulse:  88  SpO2:  91%    General: mildly anxious appearing, somewhat drowsy appearing  NIH Stroke scale 1A: Level of Consciousness - 1 1B: Ask Month and Age - 0 1C: 'Blink Eyes' & 'Squeeze Hands' - 0 2: Test Horizontal Extraocular Movements - 0 3: Test Visual Fields - 0 4: Test Facial Palsy - 0 5A: Test Left Arm Motor Drift - 2 5B: Test Right Arm Motor Drift - 2 6A: Test Left Leg Motor Drift - 2 6B: Test Right Leg Motor Drift - 2 7: Test Limb Ataxia - X 8: Test Sensation - 0 9: Test Language/Aphasia- 0 10: Test Dysarthria - 0 11: Test Extinction/Inattention - 0 NIHSS score: 7   Imaging Reviewed:  Head CT no acute intracranial process   Labs reviewed in epic and pertinent values follow:  Basic Metabolic Panel: No results for input(s): "NA", "K", "CL", "CO2", "GLUCOSE", "BUN", "CREATININE", "CALCIUM", "MG", "PHOS" in the last 168 hours.  CBC: Recent Labs  Lab 01/10/24 1950  WBC 9.9  NEUTROABS 4.3  HGB 10.6*  HCT 32.5*  MCV 87.1  PLT 302    Coagulation Studies: Recent Labs    01/10/24 1950  LABPROT 13.3  INR 1.0     Assessment: Non-specific encephalopathy based on exam and history, seems similar to episode in Dec 2014. History as provided does not seem consistent with stroke or seizure.   Recommendations:  - Hold off on CTA in the emergent setting, given hypotension reported she may have an AKI (no Cr back at time of decision) and her exam is  not consistent with an LVO - Toxic/metabolic/infectious workup per ED / primary team - MRI brain if she is not returning to baseline  - Please notify neurology if there is a significant change or additional relevant information comes to light   Baldwin Levee MD-PhD [facility] [phone]   If 8pm-8am, please page neurology on call as listed in AMION.  CRITICAL CARE Performed by: Ronnette Coke   Total critical care time:  30 minutes  Critical care time was exclusive of separately billable procedures and treating other patients.  Critical care was necessary to treat or prevent imminent or life-threatening deterioration -- emergent evaluation for consideration of thrombolytic or thrombectomy  Critical care was time spent personally by me on the following activities: development of treatment plan with patient and/or surrogate as well as nursing, discussions with consultants, evaluation of patient's response to treatment, examination of patient, obtaining history from patient or surrogate, ordering and performing treatments and interventions, ordering and review of laboratory studies, ordering and review of radiographic studies, pulse oximetry and re-evaluation of patient's condition.

## 2024-01-11 ENCOUNTER — Observation Stay (HOSPITAL_BASED_OUTPATIENT_CLINIC_OR_DEPARTMENT_OTHER): Payer: Self-pay

## 2024-01-11 ENCOUNTER — Other Ambulatory Visit (HOSPITAL_COMMUNITY): Payer: Self-pay | Admitting: *Deleted

## 2024-01-11 ENCOUNTER — Observation Stay (HOSPITAL_COMMUNITY): Payer: Self-pay

## 2024-01-11 DIAGNOSIS — R55 Syncope and collapse: Secondary | ICD-10-CM

## 2024-01-11 DIAGNOSIS — E876 Hypokalemia: Secondary | ICD-10-CM

## 2024-01-11 DIAGNOSIS — R739 Hyperglycemia, unspecified: Secondary | ICD-10-CM | POA: Diagnosis present

## 2024-01-11 DIAGNOSIS — R29818 Other symptoms and signs involving the nervous system: Secondary | ICD-10-CM

## 2024-01-11 DIAGNOSIS — I10 Essential (primary) hypertension: Secondary | ICD-10-CM

## 2024-01-11 DIAGNOSIS — E782 Mixed hyperlipidemia: Secondary | ICD-10-CM

## 2024-01-11 LAB — COMPREHENSIVE METABOLIC PANEL WITH GFR
ALT: 11 U/L (ref 0–44)
AST: 15 U/L (ref 15–41)
Albumin: 3.7 g/dL (ref 3.5–5.0)
Alkaline Phosphatase: 105 U/L (ref 38–126)
Anion gap: 7 (ref 5–15)
BUN: 8 mg/dL (ref 8–23)
CO2: 25 mmol/L (ref 22–32)
Calcium: 9.1 mg/dL (ref 8.9–10.3)
Chloride: 102 mmol/L (ref 98–111)
Creatinine, Ser: 0.65 mg/dL (ref 0.44–1.00)
GFR, Estimated: >60 mL/min (ref >60)
Glucose, Bld: 116 mg/dL — ABNORMAL HIGH (ref 70–99)
Potassium: 4 mmol/L (ref 3.5–5.1)
Sodium: 134 mmol/L — ABNORMAL LOW (ref 135–145)
Total Bilirubin: 0.7 mg/dL (ref 0.0–1.2)
Total Protein: 7.2 g/dL (ref 6.5–8.1)

## 2024-01-11 LAB — ECHOCARDIOGRAM COMPLETE
AR max vel: 2.49 cm2
AV Area VTI: 2.49 cm2
AV Area mean vel: 2.92 cm2
AV Mean grad: 5 mmHg
AV Peak grad: 10.1 mmHg
Ao pk vel: 1.59 m/s
Area-P 1/2: 1.89 cm2
Height: 66 in
P 1/2 time: 546 ms
S' Lateral: 3.1 cm

## 2024-01-11 LAB — HEMOGLOBIN A1C
Hgb A1c MFr Bld: 5.8 % — ABNORMAL HIGH (ref 4.8–5.6)
Mean Plasma Glucose: 119.76 mg/dL

## 2024-01-11 LAB — CBC
HCT: 33.2 % — ABNORMAL LOW (ref 36.0–46.0)
Hemoglobin: 10.6 g/dL — ABNORMAL LOW (ref 12.0–15.0)
MCH: 28 pg (ref 26.0–34.0)
MCHC: 31.9 g/dL (ref 30.0–36.0)
MCV: 87.8 fL (ref 80.0–100.0)
Platelets: 274 10*3/uL (ref 150–400)
RBC: 3.78 MIL/uL — ABNORMAL LOW (ref 3.87–5.11)
RDW: 14.6 % (ref 11.5–15.5)
WBC: 7.9 10*3/uL (ref 4.0–10.5)
nRBC: 0 % (ref 0.0–0.2)

## 2024-01-11 LAB — MAGNESIUM
Magnesium: 2 mg/dL (ref 1.7–2.4)
Magnesium: 2.7 mg/dL — ABNORMAL HIGH (ref 1.7–2.4)

## 2024-01-11 LAB — PHOSPHORUS: Phosphorus: 3.9 mg/dL (ref 2.5–4.6)

## 2024-01-11 LAB — HIV ANTIBODY (ROUTINE TESTING W REFLEX): HIV Screen 4th Generation wRfx: NONREACTIVE

## 2024-01-11 MED ORDER — LORAZEPAM 2 MG/ML IJ SOLN: 1.0000 mg | INTRAMUSCULAR | Status: DC | PRN

## 2024-01-11 MED ORDER — ROSUVASTATIN CALCIUM 10 MG PO TABS
10.0000 mg | ORAL_TABLET | Freq: Every day | ORAL | Status: DC
  Administered 2024-01-11 – 2024-01-12 (×2): 10 mg via ORAL
  Filled 2024-01-11 (×2): qty 1

## 2024-01-11 MED ORDER — ADULT MULTIVITAMIN W/MINERALS CH
1.0000 | ORAL_TABLET | Freq: Every day | ORAL | Status: DC
  Administered 2024-01-11 – 2024-01-12 (×2): 1 via ORAL
  Filled 2024-01-11 (×2): qty 1

## 2024-01-11 MED ORDER — ONDANSETRON HCL 4 MG/2ML IJ SOLN: 4.0000 mg | Freq: Four times a day (QID) | INTRAMUSCULAR | Status: DC | PRN

## 2024-01-11 MED ORDER — THIAMINE HCL 100 MG/ML IJ SOLN
100.0000 mg | Freq: Every day | INTRAMUSCULAR | Status: DC
Start: 2024-01-11 — End: 2024-01-12
  Filled 2024-01-11: qty 2

## 2024-01-11 MED ORDER — LACTATED RINGERS IV SOLN: INTRAVENOUS | Status: AC

## 2024-01-11 MED ORDER — THIAMINE MONONITRATE 100 MG PO TABS
100.0000 mg | ORAL_TABLET | Freq: Every day | ORAL | Status: DC
  Administered 2024-01-11 – 2024-01-12 (×2): 100 mg via ORAL
  Filled 2024-01-11 (×2): qty 1

## 2024-01-11 MED ORDER — ONDANSETRON HCL 4 MG PO TABS: 4.0000 mg | ORAL_TABLET | Freq: Four times a day (QID) | ORAL | Status: DC | PRN

## 2024-01-11 MED ORDER — LORAZEPAM 1 MG PO TABS: 1.0000 mg | ORAL_TABLET | ORAL | Status: DC | PRN

## 2024-01-11 MED ORDER — LISINOPRIL-HYDROCHLOROTHIAZIDE 20-12.5 MG PO TABS: 1.0000 | ORAL_TABLET | Freq: Every day | ORAL | Status: DC

## 2024-01-11 MED ORDER — FOLIC ACID 1 MG PO TABS
1.0000 mg | ORAL_TABLET | Freq: Every day | ORAL | Status: DC
  Administered 2024-01-11 – 2024-01-12 (×2): 1 mg via ORAL
  Filled 2024-01-11 (×2): qty 1

## 2024-01-11 MED ORDER — MAGNESIUM SULFATE 2 GM/50ML IV SOLN
2.0000 g | Freq: Once | INTRAVENOUS | Status: AC
  Administered 2024-01-11: 2 g via INTRAVENOUS
  Filled 2024-01-11: qty 50

## 2024-01-11 MED ORDER — POTASSIUM CHLORIDE 10 MEQ/100ML IV SOLN
10.0000 meq | INTRAVENOUS | Status: AC
  Administered 2024-01-11 (×2): 10 meq via INTRAVENOUS
  Filled 2024-01-11 (×2): qty 100

## 2024-01-11 MED ORDER — LISINOPRIL 10 MG PO TABS
20.0000 mg | ORAL_TABLET | Freq: Every day | ORAL | Status: DC
  Administered 2024-01-12: 20 mg via ORAL
  Filled 2024-01-11: qty 2

## 2024-01-11 MED ORDER — ENOXAPARIN SODIUM 40 MG/0.4ML IJ SOSY
40.0000 mg | PREFILLED_SYRINGE | INTRAMUSCULAR | Status: DC
  Administered 2024-01-11: 40 mg via SUBCUTANEOUS
  Filled 2024-01-11 (×2): qty 0.4

## 2024-01-11 MED ORDER — ACETAMINOPHEN 325 MG PO TABS: 650.0000 mg | ORAL_TABLET | Freq: Four times a day (QID) | ORAL | Status: DC | PRN

## 2024-01-11 MED ORDER — ACETAMINOPHEN 650 MG RE SUPP: 650.0000 mg | Freq: Four times a day (QID) | RECTAL | Status: DC | PRN

## 2024-01-11 NOTE — H&P (Signed)
 History and Physical    Patient: Kendra Valenzuela:[id number] DOB: 12-24-1960 DOA: 01/10/2024 DOS: the patient was seen and examined on 01/11/2024 PCP: Omie Bickers, MD  Patient coming from: Home  Chief Complaint:  Chief Complaint  Patient presents with   Code Stroke   HPI: Kendra Valenzuela is a 63 y.o. female with medical history significant of hypertension, hyperlipidemia who presents to the emergency department via EMS as a "code stroke" activation.  Patient was somnolent at bedside, though easily arousable but quickly goes back to sleep.  Most of the history was obtained from ED physician and husband at bedside.  Apparently, patient was pacing back-and-forth at home and acting confused.  Husband asked her to sit for some time, she became verbally unresponsive shortly after this.  EMS was activated on arrival of EMS team, patient was thought to have facial droop, initial BP was low at 82/40, but is improved to 117/65 with Trendelenburg position.  Patient was taken to the ED for further evaluation and management.  ED Course:  In the emergency department, temperature was 97.52F, BP 126/103, other vital signs were within normal range.  Workup in the ED showed normocytic anemia.  BMP was normal except for potassium of 2.9 and blood glucose of 162.  Magnesium 2.0. CT head without contrast showed no evidence of acute intracranial abnormality. Potassium was replenished,.  Neurologist was consulted and thought this was due to encephalopathy rather than stroke.  TR[facility] was asked to admit patient  Review of Systems: Review of systems as noted in the HPI. All other systems reviewed and are negative.   Past Medical History:  Diagnosis Date   Anxiety    Arthritis    Back pain    Chest pain 01/2013   Admitted to [facility] in 01/2013   Environmental allergies    GERD (gastroesophageal reflux disease)    Headache(784.0)    Hypercholesteremia    Hypertension 01/13/2013   Past Surgical History:   Procedure Laterality Date   CARDIAC CATHETERIZATION     CHOLECYSTECTOMY N/A 01/21/2014   Procedure: LAPAROSCOPIC CHOLECYSTECTOMY;  Surgeon: Beau Bound, MD;  Location: [facility];  Service: General;  Laterality: N/A;   COLONOSCOPY WITH PROPOFOL  N/A 06/17/2017   Procedure: COLONOSCOPY WITH PROPOFOL ;  Surgeon: Alyce Jubilee, MD;  Location: [facility];  Service: Endoscopy;  Laterality: N/A;  12:45pm   DILATION AND CURETTAGE OF UTERUS     LEFT HEART CATHETERIZATION WITH CORONARY ANGIOGRAM N/A 02/08/2013   Procedure: LEFT HEART CATHETERIZATION WITH CORONARY ANGIOGRAM;  Surgeon: Odie Benne, MD;  Location: [facility] CATH LAB;  Service: Cardiovascular;  Laterality: N/A;   POLYPECTOMY  06/17/2017   Procedure: POLYPECTOMY;  Surgeon: Alyce Jubilee, MD;  Location: [facility];  Service: Endoscopy;;  colon    UNILATERAL SALPINGECTOMY Right    due to ectopic pregnancy    Social History:  reports that she has never smoked. She has never used smokeless tobacco. She reports that she does not currently use alcohol after a past usage of about 14.0 standard drinks of alcohol per week. She reports that she does not use drugs.   No Known Allergies  Family History  Problem Relation Age of Onset   Hypertension Father    Colon cancer Neg Hx    Colon polyps Neg Hx      Prior to Admission medications   Medication Sig Start Date End Date Taking? Authorizing Provider  acetaminophen  (TYLENOL ) 325 MG tablet Take 650 mg by  mouth every 6 (six) hours as needed.    [provider]  aspirin  EC 81 MG tablet Take 81 mg by mouth daily.    [provider]  cholecalciferol (VITAMIN D) 1000 units tablet Take 1,000 Units by mouth daily.    [provider]  diclofenac  sodium (VOLTAREN ) 1 % GEL Apply 2 g topically 4 (four) times daily. 05/30/18   Alissa April, MD  diphenhydrAMINE (BENADRYL) 25 MG tablet Take 50 mg by mouth 2 (two) times daily as needed for allergies.    [provider]  HYDROcodone -acetaminophen  (NORCO/VICODIN) 5-325 MG tablet Take 1 tablet by mouth every 6 (six) hours as needed for severe pain. 01/29/22   Idol, Julie, PA-C  lidocaine  (LIDODERM ) 5 % Place 1 patch onto the skin daily. Remove & Discard patch within 12 hours or as directed by MD 01/29/22   Idol, Julie, PA-C  lisinopril-hydrochlorothiazide (PRINZIDE,ZESTORETIC) 20-12.5 MG tablet Take 1 tablet by mouth daily.  01/29/18   [provider]  rosuvastatin (CRESTOR) 10 MG tablet Take 10 mg by mouth daily.    [provider]  traZODone (DESYREL) 50 MG tablet Take 50 mg by mouth at bedtime. 10/16/22   [provider]    Physical Exam: BP (!) 140/79   Pulse 76   Temp 98.3 F (36.8 C) (Oral)   Resp 17   Ht 5\' 6"  (1.676 m)   SpO2 96%   BMI 29.21 kg/m   General: 63 y.o. year-old female somnolent but easily arousable, though quickly goes back to sleep and was in no acute distress. HEENT: NCAT, EOMI Neck: Supple, trachea medial Cardiovascular: Regular rate and rhythm with no rubs or gallops.  No thyromegaly or JVD noted.  No lower extremity edema. 2/4 pulses in all 4 extremities. Respiratory: Clear to auscultation with no wheezes or rales. Good inspiratory effort. Abdomen: Soft, nontender nondistended with normal bowel sounds x4 quadrants. Muskuloskeletal: No cyanosis, clubbing or edema noted bilaterally Neuro: No focal neurologic deficit.   Skin: No ulcerative lesions noted or rashes Psychiatry: Mood is appropriate for condition and setting          Labs on Admission:  Basic Metabolic Panel: Recent Labs  Lab 01/10/24 1950  NA 135  K 2.9*  CL 100  CO2 24  GLUCOSE 162*  BUN 8  CREATININE 0.78  CALCIUM 8.7*  MG 2.0   Liver Function Tests: Recent Labs  Lab 01/10/24 1950  AST 16  ALT 11  ALKPHOS 107  BILITOT 0.6  PROT 7.1  ALBUMIN 3.8   No results for input(s): "LIPASE", "AMYLASE" in the last 168 hours. No results for input(s): "AMMONIA" in the last 168  hours. CBC: Recent Labs  Lab 01/10/24 1950  WBC 9.9  NEUTROABS 4.3  HGB 10.6*  HCT 32.5*  MCV 87.1  PLT 302   Cardiac Enzymes: No results for input(s): "CKTOTAL", "CKMB", "CKMBINDEX", "TROPONINI" in the last 168 hours.  BNP (last 3 results) No results for input(s): "BNP" in the last 8760 hours.  ProBNP (last 3 results) No results for input(s): "PROBNP" in the last 8760 hours.  CBG: Recent Labs  Lab 01/10/24 2010  GLUCAP 128*    Radiological Exams on Admission: CT HEAD CODE STROKE WO CONTRAST Result Date: 01/10/2024 CLINICAL DATA:  Code stroke.  Neuro deficit, acute, stroke suspected EXAM: CT HEAD WITHOUT CONTRAST TECHNIQUE: Contiguous axial images were obtained from the base of the skull through the vertex without intravenous contrast. RADIATION DOSE REDUCTION: This exam was performed  according to the departmental dose-optimization program which includes automated exposure control, adjustment of the mA and/or kV according to patient size and/or use of iterative reconstruction technique. COMPARISON:  CT head July 25, 2018. FINDINGS: Brain: No evidence of acute infarction, hemorrhage, hydrocephalus, extra-axial collection or mass lesion/mass effect. Vascular: No hyperdense vessel identified. Skull: No acute fracture. Sinuses/Orbits: Clear sinuses.  No acute orbital findings. Other: No mastoid effusions. ASPECTS [facility] Stroke Program Early CT Score) Total score (0-10 with 10 being normal): 10. IMPRESSION: No evidence of acute intracranial abnormality. ASPECTS is 10. Code stroke imaging results were communicated on 01/10/2024 at 8:08 pm to provider Dr. Moses Arenas via telephone, who verbally acknowledged these results. Electronically Signed   By: Stevenson Elbe M.D.   On: 01/10/2024 20:09    EKG: I independently viewed the EKG done and my findings are as followed: Normal sinus rhythm at a rate of 85 bpm  Assessment/Plan Present on Admission:  Acute focal neurological deficit   Hypokalemia  Essential hypertension  Mixed hyperlipidemia  Principal Problem:   Acute focal neurological deficit Active Problems:   Essential hypertension   Mixed hyperlipidemia   Hypokalemia   Hyperglycemia  Syncope Patient was said to be hypotensive when evaluated by EMS, this could have resulted in patient's syncope Continue telemetry and watch for arrhythmias EKG personally reviewed showed normal sinus rhythm at a rate of 85 bpm Echocardiogram will be done to rule out significant aortic stenosis or other outflow obstruction, and also to evaluate EF and to rule out segmental/Regional wall motion abnormalities.  Carotid artery Dopplers will be done to rule out hemodynamically significant stenosis Consider MRI brain if patient does not return to baseline per neurologist recommendation and consider reconsulting neurology for any significant change or additional relevant information  Hypokalemia K+ 2.9, this was replenished  Hyperglycemia CBG 162, hemoglobin A1c will be checked  Essential hypertension Mixed hyperlipidemia  Essential hypertension Continue Zestoretic  Mixed hyperlipidemia Continue Crestor  DVT prophylaxis: Lovenox   Code Status: Full code  Family Communication: Husband at bedside (all questions answered to satisfaction)  Consults: None   Severity of Illness: The appropriate patient status for this patient is OBSERVATION. Observation status is judged to be reasonable and necessary in order to provide the required intensity of service to ensure the patient's safety. The patient's presenting symptoms, physical exam findings, and initial radiographic and laboratory data in the context of their medical condition is felt to place them at decreased risk for further clinical deterioration. Furthermore, it is anticipated that the patient will be medically stable for discharge from the hospital within 2 midnights of admission.   Author: Halia Franey, DO 01/11/2024  8:28 AM  For on call review [email or web address].

## 2024-01-11 NOTE — Progress Notes (Signed)
*  PRELIMINARY RESULTS* Echocardiogram 2D Echocardiogram has been performed.  Kendra Valenzuela 01/11/2024, 10:45 AM

## 2024-01-11 NOTE — ED Notes (Signed)
Pt ambulated with steady gait to bathroom with standby assist

## 2024-01-11 NOTE — Progress Notes (Signed)
 Patient seen and evaluated, chart reviewed, please see EMR for updated orders. Please see full H&P dictated by admitting physician Dr. Adefeso for same date of service.    Brief Summary:- 63 y.o. female with medical history significant of chronic anemia, history of alcohol abuse, HTN, HLD admitted on 01/11/2024 with syncopal episode and concerns for possible acute neurological event  A/p 1)Syncope- admit to telemetry monitored unit, watch for arrhythmias,  - Consider discharging on Zio patch if no significant arrhythmia found during hospitalization check serial troponins and EKG for rule out ACS protocol,  -echocardiogram without significant aortic stenosis or other outflow obstruction, and   EF is 60 to 65% without segmental/Regional wall motion abnormalities, grade 1 diastolic dysfunction noted, no mitral stenosis. - carotid artery Dopplers without hemodynamically significant stenosis - CT head and brain MRI without acute strokes  2) ambulatory dysfunction/unsteady gait--- PT eval requested -Fall precautions  3) chronic anemia--- Hgb currently around 10 which is close to baseline -No acute bleeding concerns at this time continue to monitor  4) mild hyponatremia--- maintain adequate hydration, stop hydrochlorothiazide  5)HTN--- continue lisinopril hold HCTZ as above due to hyponatremia and risk of dehydration concerns  6)HLD--continue Crestor  7)HFpEF--chronic diastolic dysfunction CHF echo as above #1 -No evidence of volume overload at this time - Hold HCTZ  Plan of care discussed with patient and patient's husband at bedside, questions answered-  Total care time 41 minutes  Patient seen and evaluated, chart reviewed, please see EMR for updated orders. Please see full H&P dictated by admitting physician Dr. Adefeso for same date of service.

## 2024-01-11 NOTE — ED Notes (Signed)
Pt walked to bathroom 

## 2024-01-11 NOTE — ED Provider Notes (Signed)
 12:01 AM Assumed care from Dr. Moses Arenas, please see their note for full history, physical and decision making until this point. In brief this is a 63 y.o. year old female who presented to the ED tonight with Code Stroke     Code stroke initially for difficulty speaking/confusion. Ct ok. Possibly encephalopathic/confusion. Neuro recommended admit to medicine. If not improving by AM then MRI in AM.   Labs, studies and imaging reviewed by myself and considered in medical decision making if ordered. Imaging interpreted by radiology.  Labs Reviewed  CBC - Abnormal; Notable for the following components:      Result Value   RBC 3.73 (*)    Hemoglobin 10.6 (*)    HCT 32.5 (*)    All other components within normal limits  DIFFERENTIAL - Abnormal; Notable for the following components:   Lymphs Abs 4.7 (*)    All other components within normal limits  COMPREHENSIVE METABOLIC PANEL WITH GFR - Abnormal; Notable for the following components:   Potassium 2.9 (*)    Glucose, Bld 162 (*)    Calcium 8.7 (*)    All other components within normal limits  CBG MONITORING, ED - Abnormal; Notable for the following components:   Glucose-Capillary 128 (*)    All other components within normal limits  ETHANOL  PROTIME-INR  APTT  RAPID URINE DRUG SCREEN, HOSP PERFORMED  URINALYSIS, ROUTINE W REFLEX MICROSCOPIC  I-STAT CHEM 8, ED    CT HEAD CODE STROKE WO CONTRAST  Final Result      No follow-ups on file.    Pavlos Yon, Reymundo Caulk, MD 01/13/24 [phone]

## 2024-01-11 NOTE — Progress Notes (Signed)
 Attempted to call and receive report with no answer.

## 2024-01-12 ENCOUNTER — Observation Stay (INDEPENDENT_AMBULATORY_CARE_PROVIDER_SITE_OTHER): Payer: Self-pay

## 2024-01-12 ENCOUNTER — Other Ambulatory Visit: Payer: Self-pay

## 2024-01-12 DIAGNOSIS — R55 Syncope and collapse: Secondary | ICD-10-CM

## 2024-01-12 MED ORDER — ASPIRIN EC 81 MG PO TBEC: 81.0000 mg | DELAYED_RELEASE_TABLET | Freq: Every day | ORAL | 2 refills | Status: AC

## 2024-01-12 MED ORDER — AMLODIPINE BESYLATE 2.5 MG PO TABS
2.5000 mg | ORAL_TABLET | Freq: Every day | ORAL | 5 refills | Status: AC
Start: 2024-01-12 — End: 2025-01-11

## 2024-01-12 NOTE — Discharge Instructions (Signed)
 1)Very Low-salt diet advised---Less than 2 gm of Sodium per day advised----ok to use Mrs DASH salt substitute instead of Salt 2)Avoid ibuprofen /Advil /Aleve /Motrin Juluis Ok Powders/Naproxen /BC powders/Meloxicam/Diclofenac /Indomethacin and other Nonsteroidal anti-inflammatory medications as these will make you more likely to bleed and can cause stomach ulcers, can also cause Kidney problems.  3)You will get a Zio-Patch to look for arrhythmias/irregular heartbeat--that can contribute to dizziness and passing out/fainting episodes 4)Abstinence From Alcohol Advised----

## 2024-01-12 NOTE — Discharge Summary (Signed)
 LAHNA STOY, is a 63 y.o. female  DOB 17-Sep-1960  MRN [id number].  Admission date:  01/10/2024  Admitting Physician  Twilla Galea, DO  Discharge Date:  01/12/2024   Primary MD  Omie Bickers, MD  Recommendations for primary care physician for things to follow:  1)Very Low-salt diet advised---Less than 2 gm of Sodium per day advised----ok to use Mrs DASH salt substitute instead of Salt 2)Avoid ibuprofen /Advil /Aleve /Motrin Juluis Ok Powders/Naproxen /BC powders/Meloxicam/Diclofenac /Indomethacin and other Nonsteroidal anti-inflammatory medications as these will make you more likely to bleed and can cause stomach ulcers, can also cause Kidney problems.  3)You will get a Zio-Patch to look for arrhythmias/irregular heartbeat--that can contribute to dizziness and passing out/fainting episodes 4)Abstinence From Alcohol Advised----  Admission Diagnosis  Hypokalemia [E87.6] Confusional state [F44.89] Acute focal neurological deficit [R29.818] Acute alteration in mental status [R41.82] Encephalopathy, unspecified type [G93.40]   Discharge Diagnosis  Hypokalemia [E87.6] Confusional state [F44.89] Acute focal neurological deficit [R29.818] Acute alteration in mental status [R41.82] Encephalopathy, unspecified type [G93.40]    Principal Problem:   Acute focal neurological deficit Active Problems:   Essential hypertension   Mixed hyperlipidemia   Hypokalemia   Hyperglycemia      Past Medical History:  Diagnosis Date   Anxiety    Arthritis    Back pain    Chest pain 01/2013   Admitted to [facility] in 01/2013   Environmental allergies    GERD (gastroesophageal reflux disease)    Headache(784.0)    Hypercholesteremia    Hypertension 01/13/2013    Past Surgical History:  Procedure Laterality Date   CARDIAC CATHETERIZATION     CHOLECYSTECTOMY N/A 01/21/2014   Procedure: LAPAROSCOPIC CHOLECYSTECTOMY;  Surgeon:  Beau Bound, MD;  Location: [facility];  Service: General;  Laterality: N/A;   COLONOSCOPY WITH PROPOFOL  N/A 06/17/2017   Procedure: COLONOSCOPY WITH PROPOFOL ;  Surgeon: Alyce Jubilee, MD;  Location: [facility];  Service: Endoscopy;  Laterality: N/A;  12:45pm   DILATION AND CURETTAGE OF UTERUS     LEFT HEART CATHETERIZATION WITH CORONARY ANGIOGRAM N/A 02/08/2013   Procedure: LEFT HEART CATHETERIZATION WITH CORONARY ANGIOGRAM;  Surgeon: Odie Benne, MD;  Location: [facility] CATH LAB;  Service: Cardiovascular;  Laterality: N/A;   POLYPECTOMY  06/17/2017   Procedure: POLYPECTOMY;  Surgeon: Alyce Jubilee, MD;  Location: [facility];  Service: Endoscopy;;  colon    UNILATERAL SALPINGECTOMY Right    due to ectopic pregnancy     HPI  from the history and physical done on the day of admission:   HPI: TALEI CRESWELL is a 63 y.o. female with medical history significant of hypertension, hyperlipidemia who presents to the emergency department via EMS as a "code stroke" activation.  Patient was somnolent at bedside, though easily arousable but quickly goes back to sleep.  Most of the history was obtained from ED physician and husband at bedside.  Apparently, patient was pacing back-and-forth at home and acting confused.  Husband asked her to sit for some time, she became verbally unresponsive shortly after  this.  EMS was activated on arrival of EMS team, patient was thought to have facial droop, initial BP was low at 82/40, but is improved to 117/65 with Trendelenburg position.  Patient was taken to the ED for further evaluation and management.   ED Course:  In the emergency department, temperature was 97.25F, BP 126/103, other vital signs were within normal range.  Workup in the ED showed normocytic anemia.  BMP was normal except for potassium of 2.9 and blood glucose of 162.  Magnesium  2.0. CT head without contrast showed no evidence of acute intracranial abnormality. Potassium was replenished,.   Neurologist was consulted and thought this was due to encephalopathy rather than stroke.  [facility] was asked to admit patient   Review of Systems: Review of systems as noted in the HPI. All other systems reviewed and are negative.     Hospital Course:   Brief Summary:- 63 y.o. female with medical history significant of chronic anemia, history of alcohol abuse, HTN, HLD admitted on 01/11/2024 with syncopal episode and concerns for possible acute neurological event   A/p 1)Syncope-on telemetry monitored unit no significant arrhythmias,  - Discharge home with Zio patch to look for significant arrhythmia \ -- Patient ruled out already for ACS by serial troponins and EKG  -echocardiogram without significant aortic stenosis or other outflow obstruction, and   EF is 60 to 65% without segmental/Regional wall motion abnormalities, grade 1 diastolic dysfunction noted, no mitral stenosis. - carotid artery Dopplers without hemodynamically significant stenosis - CT head and brain MRI without acute strokes   2) ambulatory dysfunction/unsteady gait--- physical therapist screened patient and found patient to be independent with mobilities and activities   3) chronic anemia--- Hgb currently above 10 which is close to baseline -No acute bleeding concerns at this time    4) mild hyponatremia--- maintain adequate hydration, stopped hydrochlorothiazide    5)HTN---  stopped HCTZ as above due to hyponatremia and risk of dehydration concerns-especially given history of EtOH use -continue amlodipine  for BP control   6)HLD--continue Crestor    7)HFpEF--chronic diastolic dysfunction CHF echo as above #1 -No evidence of volume overload at this time -Presented with hyponatremia and dehydration concerns - No diuretics indicated at this time  Discharge Condition: stable  Follow UP--- PCP after Zio patch results are available   Diet and Activity recommendation:  As advised  Discharge Instructions   Discharge  Instructions     Call MD for:  difficulty breathing, headache or visual disturbances   Complete by: As directed    Call MD for:  persistant dizziness or light-headedness   Complete by: As directed    Call MD for:  persistant nausea and vomiting   Complete by: As directed    Call MD for:  temperature >100.4   Complete by: As directed    Diet - low sodium heart healthy   Complete by: As directed    Discharge instructions   Complete by: As directed    1)Very Low-salt diet advised---Less than 2 gm of Sodium per day advised----ok to use Mrs DASH salt substitute instead of Salt 2)Avoid ibuprofen /Advil /Aleve /Motrin Juluis Ok Powders/Naproxen /BC powders/Meloxicam/Diclofenac /Indomethacin and other Nonsteroidal anti-inflammatory medications as these will make you more likely to bleed and can cause stomach ulcers, can also cause Kidney problems.  3)You will get a Zio-Patch to look for arrhythmias/irregular heartbeat--that can contribute to dizziness and passing out/fainting episodes 4)Abstinence From Alcohol Advised----   Increase activity slowly   Complete by: As directed  Discharge Medications     Allergies as of 01/12/2024   No Known Allergies      Medication List     TAKE these medications    acetaminophen  325 MG tablet Commonly known as: TYLENOL  Take 650 mg by mouth every 6 (six) hours as needed for mild pain (pain score 1-3).   amLODipine  2.5 MG tablet Commonly known as: NORVASC  Take 1 tablet (2.5 mg total) by mouth daily.   aspirin  EC 81 MG tablet Take 1 tablet (81 mg total) by mouth daily with breakfast. What changed:  when to take this reasons to take this   cholecalciferol 1000 units tablet Commonly known as: VITAMIN D Take 1,000 Units by mouth daily.   diclofenac  sodium 1 % Gel Commonly known as: VOLTAREN  Apply 2 g topically 4 (four) times daily.   diphenhydrAMINE 25 MG tablet Commonly known as: BENADRYL Take 50 mg by mouth 2 (two) times daily as needed  for allergies.   gabapentin 100 MG capsule Commonly known as: NEURONTIN Take 100 mg by mouth at bedtime.   HYDROcodone -acetaminophen  5-325 MG tablet Commonly known as: NORCO/VICODIN Take 1 tablet by mouth every 6 (six) hours as needed for severe pain.   rosuvastatin  10 MG tablet Commonly known as: CRESTOR  Take 10 mg by mouth daily.   traZODone 50 MG tablet Commonly known as: DESYREL Take 50 mg by mouth at bedtime.        Major procedures and Radiology Reports - PLEASE review detailed and final reports for all details, in brief -   ECHOCARDIOGRAM COMPLETE Result Date: 01/11/2024    ECHOCARDIOGRAM REPORT   Patient Name:   LYSANDRA ROETTGER Date of Exam: 01/11/2024 Medical Rec #:  [id number]           Height:       66.0 in Accession #:    [id number]          Weight:       181.0 lb Date of Birth:  1961/02/15           BSA:          1.917 m Patient Age:    62 years            BP:           140/79 mmHg Patient Gender: F                   HR:           76 bpm. Exam Location:  [facility] Procedure: 2D Echo, Cardiac Doppler and Color Doppler (Both Spectral and Color            Flow Doppler were utilized during procedure). Indications:    Syncope R55  History:        Patient has prior history of Echocardiogram examinations, most                 recent 01/27/2013. Risk Factors:Hypertension and Dyslipidemia. Hx                 of ETOH abuse.  Sonographer:    Denese Finn RCS Referring Phys: [id number] OLADAPO ADEFESO IMPRESSIONS  1. Left ventricular ejection fraction, by estimation, is 60 to 65%. The left ventricle has normal function. The left ventricle has no regional wall motion abnormalities. Left ventricular diastolic parameters are consistent with Grade I diastolic dysfunction (impaired relaxation).  2. Right ventricular systolic function is normal. The right ventricular size is normal. There is normal pulmonary  artery systolic pressure.  3. The mitral valve is normal in structure. No evidence of  mitral valve regurgitation. No evidence of mitral stenosis.  4. The tricuspid valve is abnormal.  5. The aortic valve is tricuspid. Aortic valve regurgitation is trivial. No aortic stenosis is present.  6. The inferior vena cava is normal in size with greater than 50% respiratory variability, suggesting right atrial pressure of 3 mmHg. FINDINGS  Left Ventricle: Left ventricular ejection fraction, by estimation, is 60 to 65%. The left ventricle has normal function. The left ventricle has no regional wall motion abnormalities. The left ventricular internal cavity size was normal in size. There is  no left ventricular hypertrophy. Left ventricular diastolic parameters are consistent with Grade I diastolic dysfunction (impaired relaxation). Normal left ventricular filling pressure. Right Ventricle: The right ventricular size is normal. Right vetricular wall thickness was not well visualized. Right ventricular systolic function is normal. There is normal pulmonary artery systolic pressure. The tricuspid regurgitant velocity is 2.29 m/s, and with an assumed right atrial pressure of 3 mmHg, the estimated right ventricular systolic pressure is 24.0 mmHg. Left Atrium: Left atrial size was normal in size. Right Atrium: Right atrial size was normal in size. Pericardium: There is no evidence of pericardial effusion. Mitral Valve: The mitral valve is normal in structure. No evidence of mitral valve regurgitation. No evidence of mitral valve stenosis. Tricuspid Valve: The tricuspid valve is abnormal. Tricuspid valve regurgitation is mild . No evidence of tricuspid stenosis. Aortic Valve: The aortic valve is tricuspid. Aortic valve regurgitation is trivial. Aortic regurgitation PHT measures 546 msec. No aortic stenosis is present. Aortic valve mean gradient measures 5.0 mmHg. Aortic valve peak gradient measures 10.1 mmHg. Aortic valve area, by VTI measures 2.49 cm. Pulmonic Valve: The pulmonic valve was not well visualized.  Pulmonic valve regurgitation is trivial. No evidence of pulmonic stenosis. Aorta: The aortic root is normal in size and structure. Venous: The inferior vena cava is normal in size with greater than 50% respiratory variability, suggesting right atrial pressure of 3 mmHg. IAS/Shunts: No atrial level shunt detected by color flow Doppler.  LEFT VENTRICLE PLAX 2D LVIDd:         4.70 cm   Diastology LVIDs:         3.10 cm   LV e' medial:    6.20 cm/s LV PW:         0.90 cm   LV E/e' medial:  8.8 LV IVS:        0.90 cm   LV e' lateral:   10.80 cm/s LVOT diam:     2.00 cm   LV E/e' lateral: 5.0 LV SV:         86 LV SV Index:   45 LVOT Area:     3.14 cm  RIGHT VENTRICLE RV S prime:     12.10 cm/s TAPSE (M-mode): 1.7 cm LEFT ATRIUM             Index        RIGHT ATRIUM           Index LA diam:        3.60 cm 1.88 cm/m   RA Area:     19.80 cm LA Vol (A2C):   63.3 ml 33.02 ml/m  RA Volume:   63.00 ml  32.87 ml/m LA Vol (A4C):   58.1 ml 30.31 ml/m LA Biplane Vol: 61.0 ml 31.82 ml/m  AORTIC VALVE AV Area (Vmax):    2.49 cm  AV Area (Vmean):   2.92 cm AV Area (VTI):     2.49 cm AV Vmax:           159.00 cm/s AV Vmean:          95.500 cm/s AV VTI:            0.346 m AV Peak Grad:      10.1 mmHg AV Mean Grad:      5.0 mmHg LVOT Vmax:         126.00 cm/s LVOT Vmean:        88.900 cm/s LVOT VTI:          0.274 m LVOT/AV VTI ratio: 0.79 AI PHT:            546 msec  AORTA Ao Root diam: 3.30 cm MITRAL VALVE               TRICUSPID VALVE MV Area (PHT): 1.89 cm    TR Peak grad:   21.0 mmHg MV Decel Time: 401 msec    TR Vmax:        229.00 cm/s MV E velocity: 54.50 cm/s MV A velocity: 87.50 cm/s  SHUNTS MV E/A ratio:  0.62        Systemic VTI:  0.27 m                            Systemic Diam: 2.00 cm Armida Lander MD Electronically signed by Armida Lander MD Signature Date/Time: 01/11/2024/2:44:09 PM    Final    MR BRAIN WO CONTRAST Result Date: 01/11/2024 CLINICAL DATA:  63 year old female code stroke presentation, initially  hypotensive. EXAM: MRI HEAD WITHOUT CONTRAST TECHNIQUE: Multiplanar, multiecho pulse sequences of the brain and surrounding structures were obtained without intravenous contrast. COMPARISON:  Head CT yesterday.  Limited brain MRI 08/10/2013. FINDINGS: Brain: Cerebral volume is within normal limits for age. No restricted diffusion to suggest acute infarction. No midline shift, mass effect, evidence of mass lesion, ventriculomegaly, extra-axial collection or acute intracranial hemorrhage. Cervicomedullary junction and pituitary are within normal limits. Scattered but generally small bilateral cerebral white matter T2 and FLAIR hyperintensity, mostly subcortical. The extent is mild to moderate for age. No cortical encephalomalacia or chronic cerebral blood products identified. Deep gray nuclei, brainstem, and cerebellum appear negative. Vascular: Major intracranial vascular flow voids are preserved. Skull and upper cervical spine: Partially visible advanced cervical spine disc and endplate degeneration at C4-C5. Normal background bone marrow signal. Sinuses/Orbits: Negative. Other: Mastoids are clear. Grossly normal visible internal auditory structures. Negative visible scalp and face. IMPRESSION: 1. No acute intracranial abnormality. 2. Mild to moderate for age cerebral white matter signal changes, nonspecific but most commonly due to chronic small vessel disease. Electronically Signed   By: Marlise Simpers M.D.   On: 01/11/2024 11:41   US  Carotid Bilateral Result Date: 01/11/2024 CLINICAL DATA:  Syncope EXAM: BILATERAL CAROTID DUPLEX ULTRASOUND TECHNIQUE: Martina Sledge scale imaging, color Doppler and duplex ultrasound were performed of bilateral carotid and vertebral arteries in the neck. COMPARISON:  None Available. FINDINGS: Criteria: Quantification of carotid stenosis is based on velocity parameters that correlate the residual internal carotid diameter with NASCET-based stenosis levels, using the diameter of the distal internal  carotid lumen as the denominator for stenosis measurement. The following velocity measurements were obtained: RIGHT ICA: 110/54 cm/sec CCA: 110/33 cm/sec SYSTOLIC ICA/CCA RATIO:  1.0 ECA:  88 cm/sec LEFT ICA: 110/41 cm/sec CCA: 97/27 cm/sec SYSTOLIC ICA/CCA  RATIO:  1.1 ECA:  111 cm/sec RIGHT CAROTID ARTERY: No significant atherosclerotic plaque or evidence of stenosis in the internal carotid artery. RIGHT VERTEBRAL ARTERY:  Patent with normal antegrade flow. LEFT CAROTID ARTERY: Trace heterogeneous atherosclerotic plaque in the proximal internal carotid artery without evidence of stenosis. LEFT VERTEBRAL ARTERY:  Patent with normal antegrade flow. IMPRESSION: 1. No evidence of stenosis in either internal carotid artery. 2. Vertebral arteries are patent with normal antegrade flow. Electronically Signed   By: Fernando Hoyer M.D.   On: 01/11/2024 09:47   CT HEAD CODE STROKE WO CONTRAST Result Date: 01/10/2024 CLINICAL DATA:  Code stroke.  Neuro deficit, acute, stroke suspected EXAM: CT HEAD WITHOUT CONTRAST TECHNIQUE: Contiguous axial images were obtained from the base of the skull through the vertex without intravenous contrast. RADIATION DOSE REDUCTION: This exam was performed according to the departmental dose-optimization program which includes automated exposure control, adjustment of the mA and/or kV according to patient size and/or use of iterative reconstruction technique. COMPARISON:  CT head July 25, 2018. FINDINGS: Brain: No evidence of acute infarction, hemorrhage, hydrocephalus, extra-axial collection or mass lesion/mass effect. Vascular: No hyperdense vessel identified. Skull: No acute fracture. Sinuses/Orbits: Clear sinuses.  No acute orbital findings. Other: No mastoid effusions. ASPECTS [facility] Stroke Program Early CT Score) Total score (0-10 with 10 being normal): 10. IMPRESSION: No evidence of acute intracranial abnormality. ASPECTS is 10. Code stroke imaging results were communicated on  01/10/2024 at 8:08 pm to provider Dr. Moses Arenas via telephone, who verbally acknowledged these results. Electronically Signed   By: Stevenson Elbe M.D.   On: 01/10/2024 20:09   Today   Subjective    Catiana Brinley today has no no new concerns - Husband at bedside, questions answered - Ambulating independently in room without dizziness or dyspnea on exertion no palpitations or chest pains   Patient has been seen and examined prior to discharge   Objective   Blood pressure 132/79, pulse 68, temperature 98.2 F (36.8 C), temperature source Oral, resp. rate 20, height 5\' 6"  (1.676 m), SpO2 100%.   Intake/Output Summary (Last 24 hours) at 01/12/2024 1335 Last data filed at 01/12/2024 0830 Gross per 24 hour  Intake 540 ml  Output --  Net 540 ml    Exam Gen:- Awake Alert, no acute distress  HEENT:- [place].AT, No sclera icterus Neck-Supple Neck,No JVD,.  Lungs-  CTAB , good air movement bilaterally CV- S1, S2 normal, regular Abd-  +ve B.Sounds, Abd Soft, No tenderness,    Extremity/Skin:- No  edema,   good pulses Psych-affect is appropriate, oriented x3 Neuro-no new focal deficits, no tremors    Data Review   CBC w Diff:  Lab Results  Component Value Date   WBC 7.9 01/11/2024   HGB 10.6 (L) 01/11/2024   HCT 33.2 (L) 01/11/2024   PLT 274 01/11/2024   LYMPHOPCT 46 01/10/2024   MONOPCT 7 01/10/2024   EOSPCT 3 01/10/2024   BASOPCT 1 01/10/2024    CMP:  Lab Results  Component Value Date   NA 134 (L) 01/11/2024   K 4.0 01/11/2024   CL 102 01/11/2024   CO2 25 01/11/2024   BUN 8 01/11/2024   CREATININE 0.65 01/11/2024   PROT 7.2 01/11/2024   ALBUMIN 3.7 01/11/2024   BILITOT 0.7 01/11/2024   ALKPHOS 105 01/11/2024   AST 15 01/11/2024   ALT 11 01/11/2024  .  Total Discharge time is about 33 minutes  Colin Dawley M.D on 01/12/2024 at 1:35 PM  Go  to [email or web address] -  for contact info  [facility] - Office  [phone]

## 2024-01-12 NOTE — TOC CM/SW Note (Signed)
 Transition of Care [facility]) - Inpatient Brief Assessment   Patient Details  Name: Kendra Valenzuela MRN: [id number] Date of Birth: Mar 10, 1961  Transition of Care [facility]) CM/SW Contact:    Grandville Lax, LCSWA Phone Number: 01/12/2024, 8:36 AM   Clinical Narrative: Transition of Care Department [facility]) has reviewed patient and no TOC needs have been identified at this time. We will continue to monitor patient advancement through interdiciplinary progression rounds. If new patient transition needs arise, please place a TOC consult.   Transition of Care Asessment: Insurance and Status: Insurance coverage has been reviewed Patient has primary care physician: Yes Home environment has been reviewed: from home Prior level of function:: independent Prior/Current Home Services: No current home services Social Drivers of Health Review: SDOH reviewed no interventions necessary Readmission risk has been reviewed: Yes Transition of care needs: no transition of care needs at this time

## 2024-01-12 NOTE — Progress Notes (Signed)
Discharge instructions reviewed with patient and husband.  Both verbalized understanding of instructions.  Patient discharged home with family in stable condition.

## 2024-01-12 NOTE — Progress Notes (Signed)
 PT Cancellation Note  Patient Details Name: Kendra Valenzuela MRN: [id number] DOB: [birth year]   Cancelled Treatment:    Reason Eval/Treat Not Completed: PT screened, no needs identified, will sign off. Patient walking independently.   12:18 PM, 01/12/24 Walton Guppy, MPT Physical Therapist with [facility] [phone] office [phone] mobile phone

## 2024-02-17 DIAGNOSIS — R55 Syncope and collapse: Secondary | ICD-10-CM

## 2024-03-01 ENCOUNTER — Ambulatory Visit: Payer: Self-pay | Admitting: Internal Medicine
